# Patient Record
Sex: Female | Born: 1985 | Race: White | Hispanic: Yes | Marital: Single | Smoking: Never smoker
Health system: Southern US, Community
[De-identification: ages and names within clinical notes are randomized; demographics above are authoritative.]

## PROBLEM LIST (undated history)

## (undated) ENCOUNTER — Emergency Department (HOSPITAL_COMMUNITY): Admission: EM | Payer: Self-pay | Source: Home / Self Care

## (undated) DIAGNOSIS — Z789 Other specified health status: Secondary | ICD-10-CM

## (undated) DIAGNOSIS — I514 Myocarditis, unspecified: Secondary | ICD-10-CM

## (undated) DIAGNOSIS — M329 Systemic lupus erythematosus, unspecified: Secondary | ICD-10-CM

## (undated) HISTORY — DX: Myocarditis, unspecified: I51.4

## (undated) HISTORY — DX: Systemic lupus erythematosus, unspecified: M32.9

---

## 2005-07-09 ENCOUNTER — Ambulatory Visit: Payer: Self-pay | Admitting: *Deleted

## 2005-07-10 ENCOUNTER — Ambulatory Visit (HOSPITAL_COMMUNITY): Admission: RE | Admit: 2005-07-10 | Discharge: 2005-07-10 | Payer: Self-pay | Admitting: Obstetrics & Gynecology

## 2005-07-30 ENCOUNTER — Ambulatory Visit: Payer: Self-pay | Admitting: *Deleted

## 2005-08-12 ENCOUNTER — Ambulatory Visit: Payer: Self-pay | Admitting: *Deleted

## 2005-08-12 ENCOUNTER — Inpatient Hospital Stay (HOSPITAL_COMMUNITY): Admission: AD | Admit: 2005-08-12 | Discharge: 2005-08-14 | Payer: Self-pay | Admitting: Obstetrics and Gynecology

## 2006-03-20 ENCOUNTER — Emergency Department (HOSPITAL_COMMUNITY): Admission: EM | Admit: 2006-03-20 | Discharge: 2006-03-20 | Payer: Self-pay | Admitting: Emergency Medicine

## 2006-10-02 ENCOUNTER — Encounter: Payer: Self-pay | Admitting: Maternal & Fetal Medicine

## 2006-11-30 ENCOUNTER — Inpatient Hospital Stay: Payer: Self-pay

## 2012-07-22 NOTE — L&D Delivery Note (Signed)
Delivery Note At 5:39 AM a viable female precipitously delivered prior to arrival of MD or standby CNM via Vaginal, Spontaneous Delivery (Presentation: Right Occiput Anterior).  APGAR: 9, 9; weight pending.   Placenta status: Intact, Spontaneous.  Cord: 3 vessels with the following complications: None.  Cord pH: NA.  Anesthesia: None  Episiotomy: None Lacerations: None Suture Repair: NA Est. Blood Loss (mL): 150  Mom to postpartum.  Baby to nursery-stable. Dr. Tamela Oddi arrived after delivery of placenta.  Dorna Mallet 12/28/2012, 6:20 AM

## 2012-09-15 LAB — OB RESULTS CONSOLE ABO/RH: RH Type: POSITIVE

## 2012-09-15 LAB — OB RESULTS CONSOLE HEPATITIS B SURFACE ANTIGEN: Hepatitis B Surface Ag: NEGATIVE

## 2012-09-15 LAB — OB RESULTS CONSOLE RUBELLA ANTIBODY, IGM: Rubella: IMMUNE

## 2012-09-15 LAB — OB RESULTS CONSOLE HIV ANTIBODY (ROUTINE TESTING): HIV: NONREACTIVE

## 2012-09-15 LAB — OB RESULTS CONSOLE ANTIBODY SCREEN: Antibody Screen: NEGATIVE

## 2012-12-08 ENCOUNTER — Other Ambulatory Visit (HOSPITAL_COMMUNITY): Payer: Self-pay | Admitting: Obstetrics

## 2012-12-10 ENCOUNTER — Other Ambulatory Visit (HOSPITAL_COMMUNITY): Payer: Self-pay | Admitting: Obstetrics

## 2012-12-10 ENCOUNTER — Ambulatory Visit (HOSPITAL_COMMUNITY)
Admission: RE | Admit: 2012-12-10 | Discharge: 2012-12-10 | Disposition: A | Discharge status: Home / Self Care | Payer: Self-pay | Source: Ambulatory Visit | Attending: Obstetrics | Admitting: Obstetrics

## 2012-12-10 ENCOUNTER — Encounter (HOSPITAL_COMMUNITY): Payer: Self-pay

## 2012-12-10 DIAGNOSIS — O365990 Maternal care for other known or suspected poor fetal growth, unspecified trimester, not applicable or unspecified: Secondary | ICD-10-CM | POA: Insufficient documentation

## 2012-12-10 DIAGNOSIS — O365930 Maternal care for other known or suspected poor fetal growth, third trimester, not applicable or unspecified: Secondary | ICD-10-CM

## 2012-12-10 DIAGNOSIS — Z3689 Encounter for other specified antenatal screening: Secondary | ICD-10-CM | POA: Insufficient documentation

## 2012-12-15 ENCOUNTER — Other Ambulatory Visit (HOSPITAL_COMMUNITY): Payer: Self-pay

## 2012-12-17 ENCOUNTER — Other Ambulatory Visit (HOSPITAL_COMMUNITY): Payer: Self-pay | Admitting: Maternal and Fetal Medicine

## 2012-12-17 ENCOUNTER — Encounter (HOSPITAL_COMMUNITY): Payer: Self-pay

## 2012-12-17 ENCOUNTER — Ambulatory Visit (HOSPITAL_COMMUNITY)
Admission: RE | Admit: 2012-12-17 | Discharge: 2012-12-17 | Disposition: A | Discharge status: Home / Self Care | Payer: Self-pay | Source: Ambulatory Visit | Attending: Obstetrics | Admitting: Obstetrics

## 2012-12-17 VITALS — BP 98/67 | HR 65 | Wt 137.5 lb

## 2012-12-17 DIAGNOSIS — Z3689 Encounter for other specified antenatal screening: Secondary | ICD-10-CM | POA: Insufficient documentation

## 2012-12-17 DIAGNOSIS — O365930 Maternal care for other known or suspected poor fetal growth, third trimester, not applicable or unspecified: Secondary | ICD-10-CM

## 2012-12-17 DIAGNOSIS — O365990 Maternal care for other known or suspected poor fetal growth, unspecified trimester, not applicable or unspecified: Secondary | ICD-10-CM | POA: Insufficient documentation

## 2012-12-17 NOTE — Progress Notes (Signed)
Wendy Kaufman  was seen today for an ultrasound appointment.  See full report in [facility].  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  Alpha Gula, MD

## 2012-12-18 ENCOUNTER — Other Ambulatory Visit (HOSPITAL_COMMUNITY): Payer: Self-pay

## 2012-12-25 ENCOUNTER — Ambulatory Visit (HOSPITAL_COMMUNITY)
Admission: RE | Admit: 2012-12-25 | Discharge: 2012-12-25 | Disposition: A | Discharge status: Home / Self Care | Payer: Self-pay | Source: Ambulatory Visit | Attending: Obstetrics | Admitting: Obstetrics

## 2012-12-25 ENCOUNTER — Other Ambulatory Visit (HOSPITAL_COMMUNITY): Payer: Self-pay

## 2012-12-25 VITALS — BP 114/62 | HR 62 | Wt 141.0 lb

## 2012-12-25 DIAGNOSIS — O365930 Maternal care for other known or suspected poor fetal growth, third trimester, not applicable or unspecified: Secondary | ICD-10-CM

## 2012-12-25 DIAGNOSIS — O365990 Maternal care for other known or suspected poor fetal growth, unspecified trimester, not applicable or unspecified: Secondary | ICD-10-CM | POA: Insufficient documentation

## 2012-12-25 DIAGNOSIS — Z3689 Encounter for other specified antenatal screening: Secondary | ICD-10-CM | POA: Insufficient documentation

## 2012-12-25 NOTE — Progress Notes (Signed)
Wendy Kaufman was seen for ultrasound appointment today.  Please see [facility] report for details.

## 2012-12-28 ENCOUNTER — Encounter (HOSPITAL_COMMUNITY): Payer: Self-pay | Admitting: *Deleted

## 2012-12-28 ENCOUNTER — Inpatient Hospital Stay (HOSPITAL_COMMUNITY)
Admission: AD | Admit: 2012-12-28 | Discharge: 2012-12-30 | DRG: 775 | Disposition: A | Discharge status: Home / Self Care | Payer: Medicaid Other | Source: Ambulatory Visit | Attending: Obstetrics | Admitting: Obstetrics

## 2012-12-28 DIAGNOSIS — IMO0001 Reserved for inherently not codable concepts without codable children: Secondary | ICD-10-CM

## 2012-12-28 HISTORY — DX: Other specified health status: Z78.9

## 2012-12-28 LAB — CBC
MCHC: 34.9 g/dL (ref 30.0–36.0)
Platelets: 391 10*3/uL (ref 150–400)
RDW: 14.7 % (ref 11.5–15.5)
WBC: 9.1 10*3/uL (ref 4.0–10.5)

## 2012-12-28 MED ORDER — FERROUS SULFATE 325 (65 FE) MG PO TABS
325.0000 mg | ORAL_TABLET | Freq: Two times a day (BID) | ORAL | Status: DC
  Administered 2012-12-28 – 2012-12-30 (×4): 325 mg via ORAL
  Filled 2012-12-28 (×4): qty 1

## 2012-12-28 MED ORDER — WITCH HAZEL-GLYCERIN EX PADS: 1.0000 "application " | MEDICATED_PAD | CUTANEOUS | Status: DC | PRN

## 2012-12-28 MED ORDER — ZOLPIDEM TARTRATE 5 MG PO TABS: 5.0000 mg | ORAL_TABLET | Freq: Every evening | ORAL | Status: DC | PRN

## 2012-12-28 MED ORDER — LACTATED RINGERS IV SOLN
INTRAVENOUS | Status: DC
  Administered 2012-12-28: 03:00:00 via INTRAVENOUS

## 2012-12-28 MED ORDER — SENNOSIDES-DOCUSATE SODIUM 8.6-50 MG PO TABS
2.0000 | ORAL_TABLET | Freq: Every day | ORAL | Status: DC
  Administered 2012-12-28 – 2012-12-29 (×2): 2 via ORAL

## 2012-12-28 MED ORDER — LIDOCAINE HCL (PF) 1 % IJ SOLN
30.0000 mL | INTRAMUSCULAR | Status: DC | PRN
  Filled 2012-12-28 (×2): qty 30

## 2012-12-28 MED ORDER — PRENATAL MULTIVITAMIN CH
1.0000 | ORAL_TABLET | Freq: Every day | ORAL | Status: DC
  Administered 2012-12-28 – 2012-12-29 (×2): 1 via ORAL
  Filled 2012-12-28 (×2): qty 1

## 2012-12-28 MED ORDER — ONDANSETRON HCL 4 MG PO TABS: 4.0000 mg | ORAL_TABLET | ORAL | Status: DC | PRN

## 2012-12-28 MED ORDER — MAGNESIUM HYDROXIDE 400 MG/5ML PO SUSP: 30.0000 mL | ORAL | Status: DC | PRN

## 2012-12-28 MED ORDER — CITRIC ACID-SODIUM CITRATE 334-500 MG/5ML PO SOLN
30.0000 mL | ORAL | Status: DC | PRN
Start: 2012-12-28 — End: 2012-12-28

## 2012-12-28 MED ORDER — FLEET ENEMA 7-19 GM/118ML RE ENEM: 1.0000 | ENEMA | RECTAL | Status: DC | PRN

## 2012-12-28 MED ORDER — LACTATED RINGERS IV SOLN: 500.0000 mL | INTRAVENOUS | Status: DC | PRN

## 2012-12-28 MED ORDER — TETANUS-DIPHTH-ACELL PERTUSSIS 5-2.5-18.5 LF-MCG/0.5 IM SUSP
0.5000 mL | Freq: Once | INTRAMUSCULAR | Status: AC
  Administered 2012-12-29: 0.5 mL via INTRAMUSCULAR
  Filled 2012-12-28: qty 0.5

## 2012-12-28 MED ORDER — IBUPROFEN 600 MG PO TABS
600.0000 mg | ORAL_TABLET | Freq: Four times a day (QID) | ORAL | Status: DC
  Administered 2012-12-28 – 2012-12-30 (×8): 600 mg via ORAL
  Filled 2012-12-28 (×8): qty 1

## 2012-12-28 MED ORDER — BENZOCAINE-MENTHOL 20-0.5 % EX AERO: 1.0000 "application " | INHALATION_SPRAY | CUTANEOUS | Status: DC | PRN

## 2012-12-28 MED ORDER — OXYCODONE-ACETAMINOPHEN 5-325 MG PO TABS: 1.0000 | ORAL_TABLET | ORAL | Status: DC | PRN

## 2012-12-28 MED ORDER — ACETAMINOPHEN 325 MG PO TABS: 650.0000 mg | ORAL_TABLET | ORAL | Status: DC | PRN

## 2012-12-28 MED ORDER — DIPHENHYDRAMINE HCL 25 MG PO CAPS: 25.0000 mg | ORAL_CAPSULE | Freq: Four times a day (QID) | ORAL | Status: DC | PRN

## 2012-12-28 MED ORDER — ONDANSETRON HCL 4 MG/2ML IJ SOLN: 4.0000 mg | INTRAMUSCULAR | Status: DC | PRN

## 2012-12-28 MED ORDER — IBUPROFEN 600 MG PO TABS
600.0000 mg | ORAL_TABLET | Freq: Four times a day (QID) | ORAL | Status: DC | PRN
  Administered 2012-12-28: 600 mg via ORAL
  Filled 2012-12-28: qty 1

## 2012-12-28 MED ORDER — ONDANSETRON HCL 4 MG/2ML IJ SOLN: 4.0000 mg | Freq: Four times a day (QID) | INTRAMUSCULAR | Status: DC | PRN

## 2012-12-28 MED ORDER — LANOLIN HYDROUS EX OINT: TOPICAL_OINTMENT | CUTANEOUS | Status: DC | PRN

## 2012-12-28 MED ORDER — OXYTOCIN BOLUS FROM INFUSION
500.0000 mL | INTRAVENOUS | Status: DC
  Administered 2012-12-28: 500 mL via INTRAVENOUS

## 2012-12-28 MED ORDER — DIBUCAINE 1 % RE OINT: 1.0000 "application " | TOPICAL_OINTMENT | RECTAL | Status: DC | PRN

## 2012-12-28 MED ORDER — MEASLES, MUMPS & RUBELLA VAC ~~LOC~~ INJ: 0.5000 mL | INJECTION | Freq: Once | SUBCUTANEOUS | Status: DC

## 2012-12-28 MED ORDER — OXYTOCIN 40 UNITS IN LACTATED RINGERS INFUSION - SIMPLE MED
62.5000 mL/h | INTRAVENOUS | Status: DC
  Filled 2012-12-28: qty 1000

## 2012-12-28 NOTE — MAU Note (Signed)
Contractions and ?leaking fluid since 10 pm

## 2012-12-28 NOTE — H&P (Signed)
Wendy Kaufman is a 27 y.o. female presenting for contractions. Maternal Medical History:  Reason for admission: Contractions.   Contractions: Onset was 6-12 hours ago.    Fetal activity: Perceived fetal activity is normal.    Prenatal complications: U/S for growth w/lagging AC  Prenatal Complications - Diabetes: none.    OB History   Grav Para Term Preterm Abortions TAB SAB Ect Mult Living   3 2 2  0 0 0 0 0 0 2     Past Medical History  Diagnosis Date  . Medical history non-contributory    History reviewed. No pertinent past surgical history. Family History: family history is not on file. Social History:  reports that she has never smoked. She does not have any smokeless tobacco history on file. She reports that she does not drink alcohol or use illicit drugs.     Review of Systems  Constitutional: Negative for fever.  Eyes: Negative for blurred vision.  Respiratory: Negative for shortness of breath.   Gastrointestinal: Negative for vomiting.  Skin: Negative for rash.  Neurological: Negative for headaches.    Dilation: 8.5 Effacement (%): 100 Station: 0 Exam by:: [email or web address]-[email or web address], rnc Blood pressure 123/67, pulse 75, temperature 98.3 F (36.8 C), temperature source Oral, resp. rate 18, height 5' (1.524 m), weight 140 lb (63.504 kg), last menstrual period 04/20/2012, SpO2 100.00%. Maternal Exam:  Abdomen: not evaluated.  Introitus: not evaluated.     Fetal Exam Fetal Monitor Review: Variability: moderate (6-25 bpm).   Pattern: accelerations present and no decelerations.    Fetal State Assessment: Category I - tracings are normal.     Physical Exam  Constitutional: She appears well-developed.  HENT:  Head: Normocephalic.  Neck: Neck supple. No thyromegaly present.  Cardiovascular: Normal rate and regular rhythm.   Respiratory: Breath sounds normal.  GI: Soft. Bowel sounds are normal.  Skin: No rash noted.    Prenatal labs: ABO, Rh:  O/Positive/-- (02/25 0000) Antibody: Negative (02/25 0000) Rubella: Immune (02/25 0000) RPR: Nonreactive (02/25 0000)  HBsAg: Negative (02/25 0000)  HIV: Non-reactive (02/25 0000)  GBS:     Assessment/Plan: Multipara at term, active labor, Category 1 FHT Admit, anticipate an NSVD   JACKSON-MOORE,Shade Rivenbark A 12/28/2012, 5:57 AM

## 2012-12-28 NOTE — Progress Notes (Signed)
UR chart review completed.  

## 2012-12-28 NOTE — Lactation Note (Signed)
This note was copied from the chart of Wendy Jumanah Hynson. Lactation Consultation Note  Patient Name: Wendy Kaufman [id number] Date: 12/28/2012 Reason for consult: Initial assessment   Maternal Data Formula Feeding for Exclusion: Yes Reason for exclusion: Mother's choice to formula and breast feed on admission  Feeding   LATCH Score/Interventions     Lactation Tools Discussed/Used     Consult Status Consult Status: Follow-up Date: 12/28/12 Follow-up type: In-patient  Spanish brochure left with mom. Will follow up later today with Spanish interpreter.   Pamelia Hoit 12/28/2012, 11:47 AM

## 2012-12-29 NOTE — Progress Notes (Signed)
Patient ID: Wendy Kaufman, female   DOB: 1986-05-17, 27 y.o.   MRN: [id number] Postpartum day one Vital signs normal Fundus firm Lochia moderate Legs negative Doing well

## 2012-12-30 NOTE — Progress Notes (Signed)
Stopped by to check on patient. 

## 2012-12-30 NOTE — Lactation Note (Signed)
This note was copied from the chart of [name]. Lactation Consultation Note  Patient Name: [name] Date: 12/30/2012 Reason for consult: Follow-up assessment   Maternal Data Formula Feeding for Exclusion: Yes Reason for exclusion: Mother's choice to formula and breast feed on admission  Feeding   LATCH Score/Interventions  Lactation Tools Discussed/Used     Consult Status Consult Status: Complete  Went in with interpreter. Mom reports baby is nursing well and she has no questions.  Pamelia Hoit 12/30/2012, 10:55 AM

## 2012-12-30 NOTE — Lactation Note (Signed)
This note was copied from the chart of Girl Genita Nilsson. Lactation Consultation Note  Patient Name: Girl Pamella Samons [id number] Date: 12/30/2012 Reason for consult: Follow-up assessment   Maternal Data Formula Feeding for Exclusion: Yes Reason for exclusion: Mother's choice to formula and breast feed on admission  Feeding   LATCH Score/Interventions Latch: Grasps breast easily, tongue down, lips flanged, rhythmical sucking.  Audible Swallowing: A few with stimulation  Type of Nipple: Everted at rest and after stimulation  Comfort (Breast/Nipple): Soft / non-tender     Hold (Positioning): No assistance needed to correctly position infant at breast.  LATCH Score: 9  Lactation Tools Discussed/Used     Consult Status Consult Status: Complete  Experienced BF mom had baby latched to breast when I went in. Observed good latch with few swallows noted. To call  Prn.   Pamelia Hoit 12/30/2012, 8:49 AM

## 2012-12-30 NOTE — Discharge Summary (Signed)
Obstetric Discharge Summary Reason for Admission: induction of labor Prenatal Procedures: none Intrapartum Procedures: spontaneous vaginal delivery Postpartum Procedures: none Complications-Operative and Postpartum: none Hemoglobin  Date Value Range Status  12/28/2012 12.9  12.0 - 15.0 g/dL Final     HCT  Date Value Range Status  12/28/2012 37.0  36.0 - 46.0 % Final    Physical Exam:  General: alert Lochia: appropriate Uterine Fundus: firm Incision: healing well DVT Evaluation: No evidence of DVT seen on physical exam.  Discharge Diagnoses: Term Pregnancy-delivered  Discharge Information: Date: 12/30/2012 Activity: pelvic rest Diet: routine Medications: Percocet Condition: stable Instructions: refer to practice specific booklet Discharge to: home Follow-up Information   Schedule an appointment as soon as possible for a visit with Kathreen Cosier, MD.   Contact information:   [address] [place] [address] [phone]       Newborn Data: Live born female  Birth Weight: 4 lb 10.1 oz (2101 g) APGAR: 9, 9  Home with mother.  Lorana Maffeo A 12/30/2012, 6:42 AM

## 2013-01-01 ENCOUNTER — Ambulatory Visit (HOSPITAL_COMMUNITY): Payer: Self-pay

## 2014-02-12 ENCOUNTER — Encounter (HOSPITAL_COMMUNITY): Payer: Self-pay | Admitting: Emergency Medicine

## 2014-02-12 DIAGNOSIS — M7989 Other specified soft tissue disorders: Secondary | ICD-10-CM | POA: Insufficient documentation

## 2014-02-12 DIAGNOSIS — Z3202 Encounter for pregnancy test, result negative: Secondary | ICD-10-CM | POA: Insufficient documentation

## 2014-02-12 DIAGNOSIS — Z79899 Other long term (current) drug therapy: Secondary | ICD-10-CM | POA: Insufficient documentation

## 2014-02-12 DIAGNOSIS — M549 Dorsalgia, unspecified: Secondary | ICD-10-CM | POA: Insufficient documentation

## 2014-02-12 DIAGNOSIS — R109 Unspecified abdominal pain: Secondary | ICD-10-CM | POA: Insufficient documentation

## 2014-02-12 DIAGNOSIS — Z791 Long term (current) use of non-steroidal anti-inflammatories (NSAID): Secondary | ICD-10-CM | POA: Insufficient documentation

## 2014-02-12 DIAGNOSIS — R609 Edema, unspecified: Secondary | ICD-10-CM | POA: Insufficient documentation

## 2014-02-12 LAB — COMPREHENSIVE METABOLIC PANEL
ALBUMIN: 3.3 g/dL — AB (ref 3.5–5.2)
ALT: 23 U/L (ref 0–35)
AST: 28 U/L (ref 0–37)
Alkaline Phosphatase: 130 U/L — ABNORMAL HIGH (ref 39–117)
Anion gap: 11 (ref 5–15)
BUN: 12 mg/dL (ref 6–23)
CALCIUM: 8.4 mg/dL (ref 8.4–10.5)
CO2: 19 meq/L (ref 19–32)
Chloride: 108 mEq/L (ref 96–112)
Creatinine, Ser: 0.49 mg/dL — ABNORMAL LOW (ref 0.50–1.10)
GFR calc Af Amer: >90 mL/min (ref >90)
Glucose, Bld: 87 mg/dL (ref 70–99)
Potassium: 4.1 mEq/L (ref 3.7–5.3)
SODIUM: 138 meq/L (ref 137–147)
Total Bilirubin: <0.2 mg/dL — ABNORMAL LOW (ref 0.3–1.2)
Total Protein: 7.8 g/dL (ref 6.0–8.3)

## 2014-02-12 LAB — CBC WITH DIFFERENTIAL/PLATELET
BASOS ABS: 0 10*3/uL (ref 0.0–0.1)
BASOS PCT: 0 % (ref 0–1)
EOS PCT: 2 % (ref 0–5)
Eosinophils Absolute: 0.1 10*3/uL (ref 0.0–0.7)
HCT: 32 % — ABNORMAL LOW (ref 36.0–46.0)
Hemoglobin: 10.8 g/dL — ABNORMAL LOW (ref 12.0–15.0)
LYMPHS PCT: 21 % (ref 12–46)
Lymphs Abs: 1.2 10*3/uL (ref 0.7–4.0)
MCH: 27.6 pg (ref 26.0–34.0)
MCHC: 33.8 g/dL (ref 30.0–36.0)
MCV: 81.6 fL (ref 78.0–100.0)
Monocytes Absolute: 0.4 10*3/uL (ref 0.1–1.0)
Monocytes Relative: 7 % (ref 3–12)
NEUTROS ABS: 3.8 10*3/uL (ref 1.7–7.7)
Neutrophils Relative %: 70 % (ref 43–77)
PLATELETS: 442 10*3/uL — AB (ref 150–400)
RBC: 3.92 MIL/uL (ref 3.87–5.11)
RDW: 16.7 % — AB (ref 11.5–15.5)
WBC: 5.5 10*3/uL (ref 4.0–10.5)

## 2014-02-12 NOTE — ED Notes (Signed)
Pt in c/o lower back pain, and bilateral hand and foot swelling for three days, no history of same, no distress noted

## 2014-02-13 ENCOUNTER — Emergency Department (HOSPITAL_COMMUNITY)
Admission: EM | Admit: 2014-02-13 | Discharge: 2014-02-13 | Disposition: A | Discharge status: Home / Self Care | Payer: Self-pay | Attending: Emergency Medicine | Admitting: Emergency Medicine

## 2014-02-13 ENCOUNTER — Emergency Department (HOSPITAL_COMMUNITY): Payer: Medicaid Other

## 2014-02-13 DIAGNOSIS — R609 Edema, unspecified: Secondary | ICD-10-CM

## 2014-02-13 LAB — URINALYSIS, ROUTINE W REFLEX MICROSCOPIC
Bilirubin Urine: NEGATIVE
Glucose, UA: NEGATIVE mg/dL
Ketones, ur: NEGATIVE mg/dL
Leukocytes, UA: NEGATIVE
NITRITE: NEGATIVE
Protein, ur: NEGATIVE mg/dL
SPECIFIC GRAVITY, URINE: 1.011 (ref 1.005–1.030)
UROBILINOGEN UA: 0.2 mg/dL (ref 0.0–1.0)
pH: 5.5 (ref 5.0–8.0)

## 2014-02-13 LAB — PREGNANCY, URINE: PREG TEST UR: NEGATIVE

## 2014-02-13 LAB — URINE MICROSCOPIC-ADD ON

## 2014-02-13 MED ORDER — FUROSEMIDE 20 MG PO TABS
40.0000 mg | ORAL_TABLET | Freq: Once | ORAL | Status: AC
  Administered 2014-02-13: 40 mg via ORAL
  Filled 2014-02-13: qty 2

## 2014-02-13 MED ORDER — FUROSEMIDE 20 MG PO TABS: 20.0000 mg | ORAL_TABLET | Freq: Every day | ORAL | Status: DC | PRN

## 2014-02-13 NOTE — ED Provider Notes (Signed)
CSN: [id number]     Arrival date & time 02/12/14  2200 History   First MD Initiated Contact with Patient 02/13/14 0252     Chief Complaint  Patient presents with  . Back Pain  . Foot Swelling     (Consider location/radiation/quality/duration/timing/severity/associated sxs/prior Treatment) HPI Patient complains of 3 days of upper and lower extremity bilateral swelling. Patient never had any similar symptoms. She does stand a lot. She denies any shortness of breath or chest pain. She has had low back pain. She denies hematuria, nausea or vomiting. She's had no fever chills. She's had no recent extended travel. Past Medical History  Diagnosis Date  . Medical history non-contributory    History reviewed. No pertinent past surgical history. History reviewed. No pertinent family history. History  Substance Use Topics  . Smoking status: Never Smoker   . Smokeless tobacco: Not on file  . Alcohol Use: No   OB History   Grav Para Term Preterm Abortions TAB SAB Ect Mult Living   3 3 3  0 0 0 0 0 0 3     Review of Systems  Constitutional: Negative for fever and chills.  Respiratory: Negative for cough and shortness of breath.   Cardiovascular: Positive for leg swelling. Negative for chest pain and palpitations.  Gastrointestinal: Negative for nausea, vomiting, abdominal pain and diarrhea.  Genitourinary: Negative for dysuria, frequency, hematuria and flank pain.  Musculoskeletal: Positive for back pain and myalgias. Negative for neck pain and neck stiffness.  Skin: Negative for rash and wound.  Neurological: Negative for dizziness, weakness, light-headedness, numbness and headaches.  All other systems reviewed and are negative.     Allergies  Review of patient's allergies indicates no known allergies.  Home Medications   Prior to Admission medications   Medication Sig Start Date End Date Taking? Authorizing Provider  naproxen (NAPROSYN) 500 MG tablet Take 500 mg by mouth 2 (two)  times daily with a meal.   Yes Historical Provider, MD  furosemide (LASIX) 20 MG tablet Take 1 tablet (20 mg total) by mouth daily as needed for edema. 02/13/14   Loren Racer, MD   BP 103/74  Pulse 59  Temp(Src) 98 F (36.7 C) (Oral)  Resp 17  SpO2 99% Physical Exam  Nursing note and vitals reviewed. Constitutional: She is oriented to person, place, and time. She appears well-developed and well-nourished. No distress.  HENT:  Head: Normocephalic and atraumatic.  Mouth/Throat: Oropharynx is clear and moist.  Eyes: EOM are normal. Pupils are equal, round, and reactive to light.  Neck: Normal range of motion. Neck supple.  Cardiovascular: Normal rate and regular rhythm.  Exam reveals no gallop and no friction rub.   No murmur heard. Pulmonary/Chest: Effort normal and breath sounds normal. No respiratory distress. She has no wheezes. She has no rales. She exhibits no tenderness.  Abdominal: Soft. Bowel sounds are normal. She exhibits no distension and no mass. There is no tenderness. There is no rebound and no guarding.  Musculoskeletal: Normal range of motion. She exhibits edema. She exhibits no tenderness.  2+ pitting edema to the midcalf down to the feet bilaterally. She has no calf tenderness. Patient also has pitting edema to the dorsum of bilateral hands. She is tender to palpation diffusely in the lumbar and flank region. No specific midline thoracic or lumbar tenderness.  Neurological: She is alert and oriented to person, place, and time.  5/5 motor in all extremities. Sensation is grossly intact.  Skin: Skin is warm and  dry. No rash noted. No erythema.  Psychiatric: She has a normal mood and affect. Her behavior is normal.    ED Course  Procedures (including critical care time) Labs Review Labs Reviewed  URINALYSIS, ROUTINE W REFLEX MICROSCOPIC - Abnormal; Notable for the following:    Hgb urine dipstick LARGE (*)    All other components within normal limits  CBC WITH  DIFFERENTIAL - Abnormal; Notable for the following:    Hemoglobin 10.8 (*)    HCT 32.0 (*)    RDW 16.7 (*)    Platelets 442 (*)    All other components within normal limits  COMPREHENSIVE METABOLIC PANEL - Abnormal; Notable for the following:    Creatinine, Ser 0.49 (*)    Albumin 3.3 (*)    Alkaline Phosphatase 130 (*)    Total Bilirubin <0.2 (*)    All other components within normal limits  URINE MICROSCOPIC-ADD ON - Abnormal; Notable for the following:    Squamous Epithelial / LPF FEW (*)    All other components within normal limits  PREGNANCY, URINE    Imaging Review Ct Abdomen Pelvis Wo Contrast  02/13/2014   CLINICAL DATA:  Right flank pain and hematuria. Lower back pain. Bilateral hand and foot swelling for 3 days.  EXAM: CT ABDOMEN AND PELVIS WITHOUT CONTRAST  TECHNIQUE: Multidetector CT imaging of the abdomen and pelvis was performed following the standard protocol without IV contrast.  COMPARISON:  Pelvic ultrasound performed 12/25/2012  FINDINGS: Trace bilateral pleural effusions are seen, with mild interstitial prominence. This could reflect minimal interstitial edema.  The liver and spleen are unremarkable in appearance. The gallbladder is within normal limits. The pancreas and adrenal glands are unremarkable.  The kidneys are unremarkable in appearance. There is no evidence of hydronephrosis. No renal or ureteral stones are seen. No perinephric stranding is appreciated.  No free fluid is identified. The small bowel is unremarkable in appearance. The stomach is within normal limits. No acute vascular abnormalities are seen.  Mild soft tissue edema is noted along the lateral abdominal wall bilaterally.  The appendix is normal in caliber and contains air, without evidence for appendicitis. The colon is unremarkable in appearance.  The bladder is mildly distended and grossly unremarkable. The uterus is within normal limits. The ovaries are relatively symmetric. No suspicious adnexal  masses are seen. A small right ovarian follicle is noted.  Scattered prominent bilateral inguinal nodes are seen, measuring up to 1.5 cm in short axis. These are of uncertain significance.  No acute osseous abnormalities are identified.  IMPRESSION: 1. No acute abnormalities seen to explain the patient's symptoms. 2. Trace bilateral pleural effusions, with mild interstitial prominence. This could reflect minimal pulmonary edema. 3. Mild soft tissue edema along the lateral abdominal wall bilaterally. 4. Scattered prominent bilateral inguinal nodes, measuring up to 1.5 cm in short axis. These are of uncertain significance, though the largest node, on the right side, has a relatively benign appearance.   Electronically Signed   By: Roanna RaiderJeffery  Chang M.D.   On: 02/13/2014 04:14     EKG Interpretation None      MDM   Final diagnoses:  Peripheral edema      Labs within normal limits. CT without acute findings other than small fluid in soft tissues in the lungs. Patient with fluid retention for unknown reason. Given dose of Lasix in the emergency department. Start him when necessary low dose Lasix. Advised to use compression stockings when standing. She's also advised to establish care  with primary Dr. for followup. She's been given return precautions.  Loren Racer, MD 02/13/14 [phone]

## 2014-02-13 NOTE — Discharge Instructions (Signed)
Establish care with a primary MD in followup. Take medication as prescribed for swelling. Return immediately to the emergency department for worsening swelling, difficulty breathing, chest pain or for any concerns.  Edema perifrico (Peripheral Edema) Usted sufre hinchazn en las piernas, lo que se denomina edema perifrico. Esta hinchazn se debe al exceso de acumulacin de sal y agua en el organismo. El edema puede ser un signo de enfermedad cardaca, renal o heptica, o el efecto secundario de un medicamento. Tambin puede deberse a otros problemas en las venas de las piernas. Si el problema se debe a una circulacin venosa deficiente, eleve las piernas y [place] medias especiales de soporte. Evite permanecer de pie durante largos perodos. El tratamiento depende de la causa. Los chips, pickles y otros alimentos salados debern evitarse. Casi siempre es necesario restringir la sal en la dieta. Le indicarn diurticos para eliminar el exceso de sal y agua del organismo por la [place]. Estos medicamentos evitan que el rin absorba el sodio. Aumentan el flujo de [place]. El tratamiento con diurticos tambin [employer] niveles de potasio del [place]. Ser necesario que utilice suplementos de potasio si toma diurticos [employer]. Controle el peso diario para verificar los progresos en la mejora del edema. Comunquese con su mdico para realizar un control segn las indicaciones. SOLICITE ATENCIN MDICA DE INMEDIATO SI:  Aumenta en gran medida la hinchazn, el dolor, la inflamacin o el calor en las piernas.  Le falta el aire, especialmente estando [place].  Siente dolor en el pecho o en el abdomen, debilidad, o se marea.  Tiene fiebre. Document Released: 07/08/2005 Document Revised: 09/30/2011 [facility] Patient Information 2015 [place], [place]. This information is not intended to replace advice given to you by your health care provider. Make sure you discuss any questions you have with your  health care provider.  Emergency Department Resource Guide 1) Find a Doctor and Pay Out of Pocket Although you won't have to find out who is covered by your insurance plan, it is a good idea to ask around and get recommendations. You will then need to call the office and see if the doctor you have chosen will accept you as a new patient and what types of options they offer for patients who are self-pay. Some doctors offer discounts or will set up payment plans for their patients who do not have insurance, but you will need to ask so you aren't surprised when you get to your appointment.  2) Contact Your Local Health Department Not all health departments have doctors that can see patients for sick visits, but many do, so it is worth a call to see if yours does. If you don't know where your local health department is, you can check in your phone book. The CDC also has a tool to help you locate your state's health department, and many state websites also have listings of all of their local health departments.  3) Find a Walk-in Clinic If your illness is not likely to be very severe or complicated, you may want to try a walk in clinic. These are popping up all over the country in pharmacies, drugstores, and shopping centers. They're usually staffed by nurse practitioners or physician assistants that have been trained to treat common illnesses and complaints. They're usually fairly quick and inexpensive. However, if you have serious medical issues or chronic medical problems, these are probably not your best option.  No Primary Care Doctor: - Call Health Connect at  [phone] - they can  help you locate a primary care doctor that  accepts your insurance, provides certain services, etc. - Physician Referral Service- [phone]  Chronic Pain Problems: Organization         Address  Phone   Notes  [facility]  [phone] Patients need to be referred by their primary care doctor.    Medication Assistance: Organization         Address  Phone   Notes  [facility] [address] [place] [place], [place] [address] [phone] --Must be a resident of [facility] -- Must have NO insurance coverage whatsoever (no Medicaid/ Medicare, etc.) -- The pt. MUST have a primary care doctor that directs their care regularly and follows them in the community   [facility]  [phone]   [employer]  [phone]    Agencies that provide inexpensive medical care: Organization         Address  Phone   Notes  [facility]  [phone]   [facility]    [phone]   [facility] [address] [place], [place] [address] [phone]   [facility] [address] [place], [place] [phone]   Planned Parenthood    [phone]   [facility]    [phone]   [facility]  [address], [place] Phone:  [phone], Fax:  [phone] Hours of Operation:  9 am - 6 pm, M-F.  Also accepts Medicaid/Medicare and self-pay.  [facility]  [address], [place] Phone: [phone], Fax: [phone]. Hours of Operation:  8:30 am - 5:30 pm, M-F.  Also accepts Medicaid and self-pay.  [facility] [place] [address], [place] Phone: [phone]   [facility] [address] Natasha Bence [place], [place] [phone], Ext. [phone] Mondays & Thursdays: 7-9 AM.  First 15 patients are seen on a first come, first serve basis.    Medicaid-accepting [facility] Providers:  Organization         Address  Phone   Notes  [facility] [address], [place] [phone] Also accepts self-pay patients.  [facility] [address] Laurell Josephs [place], [place]  [phone]   [facility] [address], [place] [phone]   [facility] [address] [place] [phone]   Renaye Rakers [address], [place]   [phone] Only accepts [place] Access [place] patients after they have their name applied to their card.   Self-Pay (no insurance) in [facility]:  Organization         Address  Phone   Notes  Sickle Cell Patients, [facility] [address] [place], [place] [phone]   [facility] [address] [place], [place] [phone]   [facility] [place]  [address], [place] [phone]   [facility]/Dr. Osei-Bonsu  [address], Benjamin Perez or [address], [place] [phone] Phone number for both [place] and [place] locations is the same.  [facility] [address], [place] [phone]   [facility] [address], [place] or [address] [phone]   [facility] [address], [place] [phone]) [phone], phone; [phone]) [phone], fax Sees patients 1st and 3rd Saturday of every month.  Must not qualify for public or private insurance (i.e. Medicaid, Medicare, [place] Health Choice, Veterans' Benefits)  Household income should be no more than 200% of the poverty level The clinic cannot treat you if you are pregnant or think you are pregnant  Sexually transmitted diseases are not treated at the clinic.    Dental Care: Organization         Address  Phone  Notes  [facility] [address], [place] [phone]) [phone] Accepts children up to age 28 who are enrolled in [place] or [place] Health Choice; pregnant women with a Medicaid card; and children who have applied for Medicaid or [place] Health Choice, but were declined, whose parents can pay a reduced fee at time of service.    [facility] [place]  [address], [place] [phone]) [phone] Accepts children up to age 28 who are enrolled in [place] or [place] Health Choice; pregnant women with a Medicaid card; and children who have applied for Medicaid or [place] Health Choice, but were declined, whose parents can pay a reduced fee at time of service.  [facility]  [address], [place] [phone]) [phone] Patients are seen by appointment only. Walk-ins are not accepted. [facility] will see patients 28 years of age and older. Monday - Tuesday (8am-5pm) Most Wednesdays (8:30-5pm) $30 per visit, cash only  [facility]  [address], [facility] [phone]) [phone] Patients are seen by appointment only. Walk-ins are not accepted. [facility] will see patients 28 years of age and older. One Wednesday Evening (Monthly: Volunteer Based).  $30 per visit, cash only  [employer] [facility] [facility]  [phone]) [phone] for adults; Children under age 604, call Graduate Pediatric Dentistry at [phone]. Children aged 604-14, please call [phone]) [phone] to request a pediatric application.  Dental services are provided in all areas of dental care including fillings, crowns and bridges, complete and partial dentures, implants, gum treatment, root canals, and extractions. Preventive care is also provided. Treatment is provided to both adults and children. Patients are selected via a lottery and there is often a waiting list.   [facility] [address], [place]  [phone]) [phone] [email or web address]   [facility] [address], [place], [place] [phone], Ext. [phone] Second and Fourth Thursday of each month, opens at 6:30 AM; Clinic ends at 9 AM.  Patients are seen on a first-come first-served basis, and a limited number are seen during each clinic.   [facility]  [address], [place], [place] [phone])  [phone]   Eligibility Requirements You must have lived in [place], [place], or [place] counties for at least the last three months.   You cannot be eligible for state or federal sponsored [place] Cityhealthcare insurance, including CIGNAVeterans Administration, [place]icaid, or [employer]icare.   You generally cannot be eligible for healthcare insurance through your employer.    How to apply: Eligibility screenings are held every Tuesday and Wednesday afternoon from 1:00 pm until 4:00 pm. You do not need an appointment for the interview!  [facility] [address], [place], [place] [phone]   [facility]  [phone]   [facility]  [phone]   [facility]  [phone]    Behavioral Health Resources in the Community: Intensive Outpatient Programs Organization         Address  Phone  Notes  [facility] [address] [place], [place] [phone]   [facility] [address], [place], [place] [phone]   [facility] [address] [place], [place]  [phone]   [facility] [address]  [place], [place] [phone] or [phone]   Substance Abuse Resources Organization         Address  Phone  Notes  [facility] Drug Services  4254818279   [facility]  [phone]   [facility]  [phone]   Floydene Flock  [phone]   Residential & Outpatient Substance Abuse Program  [phone]   Psychological Services Organization         Address  Phone  Notes  [facility]  [phone]   [facility]  [phone]   [facility] [address] [place] [phone] or [phone]    Mobile Crisis Teams Organization         Address  Phone  Notes  [facility], Mobile Crisis Care Unit  3050672799   [facility]  [address] [place], [place] [phone]   Doristine Locks [address] [place] [phone]    Self-Help/Support Groups Organization         Address  Phone             Notes  [facility] [place] - variety of support groups  336- I7437963 Call for more information  Narcotics Anonymous (NA), [facility] [address], [employer] [place]  2 meetings at this location   Statistician         Address  Phone  Notes  [facility] [address],    [place]  [phone]   [facility]  [address] [place] [address], [place], [place] [phone]   [facility] [address] [place], [place] [phone] Admissions: 8am-3pm M-F  [facility] [address],    [place], [place] [phone]   [facility] [address] [place], [place], [place] [phone]   [facility] [address],  [place], [place] [phone]   [facility] - Intensive Outpatient [address] Laurell Josephs [address], [place], [place] [phone]   [facility] ([facility] [address] [place]  [place], [place] [phone] or [phone]   [facility] ([facility]) [address] [place], [place] [phone] Accepts Medicaid  [facility] [address]  [place] [phone] Substance Abuse/Addiction Treatment   [facility] Organization         Address  Phone  Notes  [facility]  [phone]   Angie Fava, PhD [address], [place]   [phone] or [phone]   [facility] Behavioral   [address] [place], [place] [phone]   [facility] [address] [place], [place] [phone] Insurance/Medicaid/sponsorship through [employer] and Families [address]  [place], [place] [phone]  Therapy/tele-psych/case  °[facility] [address]  ° [place],  [phone]    °Dr. Arfeen  [phone]   °[facility] [place]  [employer] [place] [facility] 1) [address], [place] °2) [address], [place] °3)  [address], [place] [phone]) [phone] °([phone]) [phone] ° °([phone]) [phone]   °[place] Child Abuse Hotline (336) 342-1394 or (336) [phone] (After Hours)    ° ° ° °

## 2014-03-08 ENCOUNTER — Encounter (HOSPITAL_COMMUNITY): Payer: Self-pay | Admitting: Emergency Medicine

## 2014-03-08 DIAGNOSIS — J039 Acute tonsillitis, unspecified: Secondary | ICD-10-CM | POA: Diagnosis present

## 2014-03-08 DIAGNOSIS — J90 Pleural effusion, not elsewhere classified: Secondary | ICD-10-CM | POA: Diagnosis present

## 2014-03-08 DIAGNOSIS — D509 Iron deficiency anemia, unspecified: Secondary | ICD-10-CM | POA: Diagnosis present

## 2014-03-08 DIAGNOSIS — R599 Enlarged lymph nodes, unspecified: Secondary | ICD-10-CM | POA: Diagnosis present

## 2014-03-08 DIAGNOSIS — J9600 Acute respiratory failure, unspecified whether with hypoxia or hypercapnia: Secondary | ICD-10-CM | POA: Diagnosis not present

## 2014-03-08 DIAGNOSIS — M329 Systemic lupus erythematosus, unspecified: Principal | ICD-10-CM | POA: Diagnosis present

## 2014-03-08 DIAGNOSIS — E872 Acidosis, unspecified: Secondary | ICD-10-CM | POA: Diagnosis present

## 2014-03-08 DIAGNOSIS — R6510 Systemic inflammatory response syndrome (SIRS) of non-infectious origin without acute organ dysfunction: Secondary | ICD-10-CM | POA: Diagnosis present

## 2014-03-08 DIAGNOSIS — E871 Hypo-osmolality and hyponatremia: Secondary | ICD-10-CM | POA: Diagnosis present

## 2014-03-08 DIAGNOSIS — I498 Other specified cardiac arrhythmias: Secondary | ICD-10-CM | POA: Diagnosis present

## 2014-03-08 LAB — RAPID STREP SCREEN (MED CTR MEBANE ONLY): STREPTOCOCCUS, GROUP A SCREEN (DIRECT): NEGATIVE

## 2014-03-08 NOTE — ED Notes (Signed)
The patient is compalinng of neck pain and sore throat.  The patient said she is unable to eat due to the pain because she cannot swallow.  The patient was seen here for peripheral edema on the 26 of July and was discharged with medication.  She says she has been taking the medication but they are not working.  She continues to have swelling in both her feet and her hands.  The patient denies any other symptoms.

## 2014-03-09 ENCOUNTER — Inpatient Hospital Stay (HOSPITAL_COMMUNITY)
Admission: EM | Admit: 2014-03-09 | Discharge: 2014-03-15 | DRG: 515 | Disposition: A | Discharge status: Home / Self Care | Payer: Medicaid Other | Attending: Internal Medicine | Admitting: Internal Medicine

## 2014-03-09 ENCOUNTER — Emergency Department (HOSPITAL_COMMUNITY): Payer: Medicaid Other

## 2014-03-09 ENCOUNTER — Inpatient Hospital Stay (HOSPITAL_COMMUNITY): Payer: Medicaid Other

## 2014-03-09 ENCOUNTER — Encounter (HOSPITAL_COMMUNITY): Payer: Self-pay | Admitting: *Deleted

## 2014-03-09 DIAGNOSIS — J039 Acute tonsillitis, unspecified: Secondary | ICD-10-CM | POA: Diagnosis present

## 2014-03-09 DIAGNOSIS — I498 Other specified cardiac arrhythmias: Secondary | ICD-10-CM | POA: Diagnosis present

## 2014-03-09 DIAGNOSIS — E872 Acidosis, unspecified: Secondary | ICD-10-CM | POA: Diagnosis present

## 2014-03-09 DIAGNOSIS — R768 Other specified abnormal immunological findings in serum: Secondary | ICD-10-CM

## 2014-03-09 DIAGNOSIS — I7789 Other specified disorders of arteries and arterioles: Secondary | ICD-10-CM

## 2014-03-09 DIAGNOSIS — M7989 Other specified soft tissue disorders: Secondary | ICD-10-CM

## 2014-03-09 DIAGNOSIS — IMO0001 Reserved for inherently not codable concepts without codable children: Secondary | ICD-10-CM

## 2014-03-09 DIAGNOSIS — J90 Pleural effusion, not elsewhere classified: Secondary | ICD-10-CM

## 2014-03-09 DIAGNOSIS — M329 Systemic lupus erythematosus, unspecified: Secondary | ICD-10-CM

## 2014-03-09 DIAGNOSIS — D509 Iron deficiency anemia, unspecified: Secondary | ICD-10-CM

## 2014-03-09 DIAGNOSIS — R609 Edema, unspecified: Secondary | ICD-10-CM | POA: Diagnosis present

## 2014-03-09 DIAGNOSIS — I509 Heart failure, unspecified: Secondary | ICD-10-CM

## 2014-03-09 DIAGNOSIS — IMO0002 Reserved for concepts with insufficient information to code with codable children: Secondary | ICD-10-CM

## 2014-03-09 DIAGNOSIS — E871 Hypo-osmolality and hyponatremia: Secondary | ICD-10-CM

## 2014-03-09 DIAGNOSIS — M3219 Other organ or system involvement in systemic lupus erythematosus: Secondary | ICD-10-CM

## 2014-03-09 DIAGNOSIS — J9600 Acute respiratory failure, unspecified whether with hypoxia or hypercapnia: Secondary | ICD-10-CM | POA: Diagnosis not present

## 2014-03-09 DIAGNOSIS — R6510 Systemic inflammatory response syndrome (SIRS) of non-infectious origin without acute organ dysfunction: Secondary | ICD-10-CM | POA: Diagnosis present

## 2014-03-09 DIAGNOSIS — R591 Generalized enlarged lymph nodes: Secondary | ICD-10-CM | POA: Diagnosis present

## 2014-03-09 DIAGNOSIS — D649 Anemia, unspecified: Secondary | ICD-10-CM | POA: Diagnosis present

## 2014-03-09 DIAGNOSIS — R599 Enlarged lymph nodes, unspecified: Secondary | ICD-10-CM | POA: Diagnosis present

## 2014-03-09 HISTORY — DX: Reserved for concepts with insufficient information to code with codable children: IMO0002

## 2014-03-09 HISTORY — DX: Systemic lupus erythematosus, unspecified: M32.9

## 2014-03-09 LAB — BASIC METABOLIC PANEL
Anion gap: 12 (ref 5–15)
BUN: 17 mg/dL (ref 6–23)
CHLORIDE: 100 meq/L (ref 96–112)
CO2: 18 mEq/L — ABNORMAL LOW (ref 19–32)
Calcium: 8.2 mg/dL — ABNORMAL LOW (ref 8.4–10.5)
Creatinine, Ser: 0.65 mg/dL (ref 0.50–1.10)
GFR calc Af Amer: >90 mL/min (ref >90)
GFR calc non Af Amer: >90 mL/min (ref >90)
GLUCOSE: 87 mg/dL (ref 70–99)
POTASSIUM: 4.3 meq/L (ref 3.7–5.3)
Sodium: 130 mEq/L — ABNORMAL LOW (ref 137–147)

## 2014-03-09 LAB — CBC WITH DIFFERENTIAL/PLATELET
BASOS ABS: 0 10*3/uL (ref 0.0–0.1)
Basophils Relative: 1 % (ref 0–1)
EOS PCT: 1 % (ref 0–5)
Eosinophils Absolute: 0 10*3/uL (ref 0.0–0.7)
HCT: 30.3 % — ABNORMAL LOW (ref 36.0–46.0)
Hemoglobin: 10.3 g/dL — ABNORMAL LOW (ref 12.0–15.0)
LYMPHS PCT: 23 % (ref 12–46)
Lymphs Abs: 1 10*3/uL (ref 0.7–4.0)
MCH: 27 pg (ref 26.0–34.0)
MCHC: 34 g/dL (ref 30.0–36.0)
MCV: 79.3 fL (ref 78.0–100.0)
MONOS PCT: 9 % (ref 3–12)
Monocytes Absolute: 0.4 10*3/uL (ref 0.1–1.0)
NEUTROS PCT: 66 % (ref 43–77)
Neutro Abs: 3 10*3/uL (ref 1.7–7.7)
PLATELETS: 365 10*3/uL (ref 150–400)
RBC: 3.82 MIL/uL — AB (ref 3.87–5.11)
RDW: 17.1 % — AB (ref 11.5–15.5)
WBC MORPHOLOGY: INCREASED
WBC: 4.4 10*3/uL (ref 4.0–10.5)

## 2014-03-09 LAB — I-STAT TROPONIN, ED: Troponin i, poc: 0 ng/mL (ref 0.00–0.08)

## 2014-03-09 LAB — COMPREHENSIVE METABOLIC PANEL
ALT: 12 U/L (ref 0–35)
ANION GAP: 13 (ref 5–15)
AST: 19 U/L (ref 0–37)
Albumin: 2.6 g/dL — ABNORMAL LOW (ref 3.5–5.2)
Alkaline Phosphatase: 118 U/L — ABNORMAL HIGH (ref 39–117)
BILIRUBIN TOTAL: 0.3 mg/dL (ref 0.3–1.2)
BUN: 15 mg/dL (ref 6–23)
CO2: 18 meq/L — AB (ref 19–32)
CREATININE: 0.57 mg/dL (ref 0.50–1.10)
Calcium: 7.8 mg/dL — ABNORMAL LOW (ref 8.4–10.5)
Chloride: 105 mEq/L (ref 96–112)
Glucose, Bld: 92 mg/dL (ref 70–99)
Potassium: 4 mEq/L (ref 3.7–5.3)
Sodium: 136 mEq/L — ABNORMAL LOW (ref 137–147)
Total Protein: 7.4 g/dL (ref 6.0–8.3)

## 2014-03-09 LAB — LACTATE DEHYDROGENASE: LDH: 159 U/L (ref 94–250)

## 2014-03-09 LAB — URINALYSIS, ROUTINE W REFLEX MICROSCOPIC
Bilirubin Urine: NEGATIVE
Glucose, UA: NEGATIVE mg/dL
KETONES UR: NEGATIVE mg/dL
NITRITE: NEGATIVE
Protein, ur: 30 mg/dL — AB
SPECIFIC GRAVITY, URINE: 1.015 (ref 1.005–1.030)
Urobilinogen, UA: 0.2 mg/dL (ref 0.0–1.0)
pH: 5.5 (ref 5.0–8.0)

## 2014-03-09 LAB — TSH: TSH: 3.22 u[IU]/mL (ref 0.350–4.500)

## 2014-03-09 LAB — PRO B NATRIURETIC PEPTIDE: Pro B Natriuretic peptide (BNP): 431.5 pg/mL — ABNORMAL HIGH (ref 0–125)

## 2014-03-09 LAB — HEPATIC FUNCTION PANEL
ALT: 15 U/L (ref 0–35)
AST: 27 U/L (ref 0–37)
Albumin: 3 g/dL — ABNORMAL LOW (ref 3.5–5.2)
Alkaline Phosphatase: 138 U/L — ABNORMAL HIGH (ref 39–117)
Total Bilirubin: 0.4 mg/dL (ref 0.3–1.2)
Total Protein: 8.1 g/dL (ref 6.0–8.3)

## 2014-03-09 LAB — URINE MICROSCOPIC-ADD ON

## 2014-03-09 LAB — URIC ACID: Uric Acid, Serum: 6.7 mg/dL (ref 2.4–7.0)

## 2014-03-09 LAB — TROPONIN I

## 2014-03-09 LAB — CBC
HCT: 31.8 % — ABNORMAL LOW (ref 36.0–46.0)
Hemoglobin: 10.8 g/dL — ABNORMAL LOW (ref 12.0–15.0)
MCH: 27.1 pg (ref 26.0–34.0)
MCHC: 34 g/dL (ref 30.0–36.0)
MCV: 79.7 fL (ref 78.0–100.0)
Platelets: 355 10*3/uL (ref 150–400)
RBC: 3.99 MIL/uL (ref 3.87–5.11)
RDW: 17.1 % — ABNORMAL HIGH (ref 11.5–15.5)
WBC: 4.9 10*3/uL (ref 4.0–10.5)

## 2014-03-09 LAB — IRON AND TIBC
Iron: <10 ug/dL — ABNORMAL LOW (ref 42–135)
UIBC: 179 ug/dL (ref 125–400)

## 2014-03-09 LAB — PROTEIN / CREATININE RATIO, URINE
CREATININE, URINE: 24.99 mg/dL
Protein Creatinine Ratio: 0.52 — ABNORMAL HIGH (ref 0.00–0.15)
Total Protein, Urine: 12.9 mg/dL

## 2014-03-09 LAB — LIPASE, BLOOD: LIPASE: 33 U/L (ref 11–59)

## 2014-03-09 LAB — I-STAT CG4 LACTIC ACID, ED: Lactic Acid, Venous: 0.78 mmol/L (ref 0.5–2.2)

## 2014-03-09 MED ORDER — SODIUM CHLORIDE 0.9 % IV BOLUS (SEPSIS)
1000.0000 mL | Freq: Once | INTRAVENOUS | Status: AC
  Administered 2014-03-09: 1000 mL via INTRAVENOUS

## 2014-03-09 MED ORDER — CLINDAMYCIN PHOSPHATE 600 MG/50ML IV SOLN
600.0000 mg | Freq: Once | INTRAVENOUS | Status: AC
  Administered 2014-03-09: 600 mg via INTRAVENOUS
  Filled 2014-03-09: qty 50

## 2014-03-09 MED ORDER — SODIUM CHLORIDE 0.9 % IV SOLN: 250.0000 mL | INTRAVENOUS | Status: DC | PRN

## 2014-03-09 MED ORDER — AZITHROMYCIN 500 MG PO TABS
500.0000 mg | ORAL_TABLET | Freq: Every day | ORAL | Status: AC
  Administered 2014-03-09: 500 mg via ORAL
  Filled 2014-03-09: qty 1

## 2014-03-09 MED ORDER — ACETAMINOPHEN 325 MG PO TABS
650.0000 mg | ORAL_TABLET | Freq: Four times a day (QID) | ORAL | Status: DC | PRN
  Administered 2014-03-11: 650 mg via ORAL
  Filled 2014-03-09 (×2): qty 2

## 2014-03-09 MED ORDER — SODIUM CHLORIDE 0.9 % IV SOLN
INTRAVENOUS | Status: AC
  Administered 2014-03-09: 05:00:00 via INTRAVENOUS

## 2014-03-09 MED ORDER — AZITHROMYCIN 250 MG PO TABS
250.0000 mg | ORAL_TABLET | Freq: Every day | ORAL | Status: DC
  Administered 2014-03-10 – 2014-03-11 (×2): 250 mg via ORAL
  Filled 2014-03-09 (×2): qty 1

## 2014-03-09 MED ORDER — IOHEXOL 300 MG/ML  SOLN
80.0000 mL | Freq: Once | INTRAMUSCULAR | Status: AC | PRN
  Administered 2014-03-09: 80 mL via INTRAVENOUS

## 2014-03-09 MED ORDER — MORPHINE SULFATE 4 MG/ML IJ SOLN
4.0000 mg | Freq: Once | INTRAMUSCULAR | Status: AC
  Administered 2014-03-09: 4 mg via INTRAVENOUS
  Filled 2014-03-09: qty 1

## 2014-03-09 MED ORDER — SODIUM CHLORIDE 0.9 % IJ SOLN
3.0000 mL | INTRAMUSCULAR | Status: DC | PRN
Start: 2014-03-09 — End: 2014-03-15

## 2014-03-09 MED ORDER — IOHEXOL 300 MG/ML  SOLN
75.0000 mL | Freq: Once | INTRAMUSCULAR | Status: AC | PRN
  Administered 2014-03-09: 75 mL via INTRAVENOUS

## 2014-03-09 MED ORDER — ACETAMINOPHEN 650 MG RE SUPP: 650.0000 mg | Freq: Four times a day (QID) | RECTAL | Status: DC | PRN

## 2014-03-09 MED ORDER — FUROSEMIDE 10 MG/ML IJ SOLN
20.0000 mg | Freq: Once | INTRAMUSCULAR | Status: AC
  Administered 2014-03-09: 20 mg via INTRAVENOUS
  Filled 2014-03-09: qty 2

## 2014-03-09 MED ORDER — CEPASTAT 14.5 MG MT LOZG
1.0000 | LOZENGE | OROMUCOSAL | Status: DC | PRN
  Administered 2014-03-09: 1 via BUCCAL
  Filled 2014-03-09 (×2): qty 9

## 2014-03-09 MED ORDER — ENOXAPARIN SODIUM 40 MG/0.4ML ~~LOC~~ SOLN
40.0000 mg | SUBCUTANEOUS | Status: DC
Start: 2014-03-09 — End: 2014-03-11
  Administered 2014-03-09 – 2014-03-10 (×2): 40 mg via SUBCUTANEOUS
  Filled 2014-03-09 (×3): qty 0.4

## 2014-03-09 MED ORDER — SODIUM CHLORIDE 0.9 % IJ SOLN
3.0000 mL | Freq: Two times a day (BID) | INTRAMUSCULAR | Status: DC
  Administered 2014-03-09 – 2014-03-13 (×8): 3 mL via INTRAVENOUS

## 2014-03-09 NOTE — Progress Notes (Signed)
Report received from Melanie, RN

## 2014-03-09 NOTE — ED Notes (Signed)
Per telephone interpreter - pt c/o bilat hand and foot edema x1 month - pt w/ +1 pitting edema. Pt also reports neck and throat pain that began this a.m. - denies any fever or associating symptoms.

## 2014-03-09 NOTE — Progress Notes (Signed)
*  PRELIMINARY RESULTS* Echocardiogram 2D Echocardiogram has been performed.  Wendy Kaufman, Wendy Kaufman 03/09/2014, 11:04 AM

## 2014-03-09 NOTE — Progress Notes (Signed)
Utilization review completed.  

## 2014-03-09 NOTE — Progress Notes (Signed)
Pt admitted to the unit. Pt is alert and oriented. Spanish speaking only. Pt oriented to room, staff, and call bell. Bed in lowest position. Full assessment to Epic. Call bell with in reach. Told to call for assists. Will continue to monitor.  Alecea Trego E

## 2014-03-09 NOTE — H&P (Signed)
Wendy Kaufman is an 28 y.o. female.   Chief Complaint: edema HPI: 28 yo female with generalized edema.  Pt states that the edema has been going on for over several weeks. Pt also has sore neck.  Pt denies fever, chills, cp, palp, sob, orthopnea, pnd.  Pt has lower ext edema.  Pt can't recall any hx of blood clots. Pt will be admitted for generalized edema.   Past Medical History  Diagnosis Date  . Medical history non-contributory     History reviewed. No pertinent past surgical history.  History reviewed. No pertinent family history. Social History:  reports that she has never smoked. She has never used smokeless tobacco. She reports that she does not drink alcohol or use illicit drugs.  Allergies: No Known Allergies   (Not in a hospital admission)  Results for orders placed during the hospital encounter of 03/09/14 (from the past 48 hour(s))  RAPID STREP SCREEN     Status: None   Collection Time    03/08/14 11:20 PM      Result Value Ref Range   Streptococcus, Group A Screen (Direct) NEGATIVE  NEGATIVE   Comment: (NOTE)     A Rapid Antigen test may result negative if the antigen level in the     sample is below the detection level of this test. The FDA has not     cleared this test as a stand-alone test therefore the rapid antigen     negative result has reflexed to a Group A Strep culture.  URINALYSIS, ROUTINE W REFLEX MICROSCOPIC     Status: Abnormal   Collection Time    03/08/14 11:20 PM      Result Value Ref Range   Color, Urine YELLOW  YELLOW   APPearance CLOUDY (*) CLEAR   Specific Gravity, Urine 1.015  1.005 - 1.030   pH 5.5  5.0 - 8.0   Glucose, UA NEGATIVE  NEGATIVE mg/dL   Hgb urine dipstick SMALL (*) NEGATIVE   Bilirubin Urine NEGATIVE  NEGATIVE   Ketones, ur NEGATIVE  NEGATIVE mg/dL   Protein, ur 30 (*) NEGATIVE mg/dL   Urobilinogen, UA 0.2  0.0 - 1.0 mg/dL   Nitrite NEGATIVE  NEGATIVE   Leukocytes, UA SMALL (*) NEGATIVE  URINE MICROSCOPIC-ADD ON      Status: Abnormal   Collection Time    03/08/14 11:20 PM      Result Value Ref Range   Squamous Epithelial / LPF MANY (*) RARE   WBC, UA 3-6  <3 WBC/hpf   RBC / HPF 3-6  <3 RBC/hpf   Bacteria, UA RARE  RARE   Urine-Other MUCOUS PRESENT    CBC     Status: Abnormal   Collection Time    03/09/14 12:11 AM      Result Value Ref Range   WBC 4.9  4.0 - 10.5 K/uL   RBC 3.99  3.87 - 5.11 MIL/uL   Hemoglobin 10.8 (*) 12.0 - 15.0 g/dL   HCT 31.8 (*) 36.0 - 46.0 %   MCV 79.7  78.0 - 100.0 fL   MCH 27.1  26.0 - 34.0 pg   MCHC 34.0  30.0 - 36.0 g/dL   RDW 17.1 (*) 11.5 - 15.5 %   Platelets 355  150 - 400 K/uL  BASIC METABOLIC PANEL     Status: Abnormal   Collection Time    03/09/14 12:11 AM      Result Value Ref Range   Sodium 130 (*) 137 -  147 mEq/L   Potassium 4.3  3.7 - 5.3 mEq/L   Chloride 100  96 - 112 mEq/L   CO2 18 (*) 19 - 32 mEq/L   Glucose, Bld 87  70 - 99 mg/dL   BUN 17  6 - 23 mg/dL   Creatinine, Ser 0.65  0.50 - 1.10 mg/dL   Calcium 8.2 (*) 8.4 - 10.5 mg/dL   GFR calc non Af Amer >90  >90 mL/min   GFR calc Af Amer >90  >90 mL/min   Comment: (NOTE)     The eGFR has been calculated using the CKD EPI equation.     This calculation has not been validated in all clinical situations.     eGFR's persistently <90 mL/min signify possible Chronic Kidney     Disease.   Anion gap 12  5 - 15  PRO B NATRIURETIC PEPTIDE     Status: Abnormal   Collection Time    03/09/14 12:11 AM      Result Value Ref Range   Pro B Natriuretic peptide (BNP) 431.5 (*) 0 - 125 pg/mL  HEPATIC FUNCTION PANEL     Status: Abnormal   Collection Time    03/09/14 12:11 AM      Result Value Ref Range   Total Protein 8.1  6.0 - 8.3 g/dL   Albumin 3.0 (*) 3.5 - 5.2 g/dL   AST 27  0 - 37 U/L   ALT 15  0 - 35 U/L   Alkaline Phosphatase 138 (*) 39 - 117 U/L   Total Bilirubin 0.4  0.3 - 1.2 mg/dL   Bilirubin, Direct <0.2  0.0 - 0.3 mg/dL   Indirect Bilirubin NOT CALCULATED  0.3 - 0.9 mg/dL  LIPASE, BLOOD      Status: None   Collection Time    03/09/14 12:11 AM      Result Value Ref Range   Lipase 33  11 - 59 U/L  I-STAT TROPOININ, ED     Status: None   Collection Time    03/09/14 12:21 AM      Result Value Ref Range   Troponin i, poc 0.00  0.00 - 0.08 ng/mL   Comment 3            Comment: Due to the release kinetics of cTnI,     a negative result within the first hours     of the onset of symptoms does not rule out     myocardial infarction with certainty.     If myocardial infarction is still suspected,     repeat the test at appropriate intervals.  I-STAT CG4 LACTIC ACID, ED     Status: None   Collection Time    03/09/14  2:55 AM      Result Value Ref Range   Lactic Acid, Venous 0.78  0.5 - 2.2 mmol/L   Dg Chest 2 View  03/09/2014   CLINICAL DATA:  Chest pain and difficulty swallowing.  EXAM: CHEST  2 VIEW  COMPARISON:  None.  FINDINGS: The cardiac silhouette, mediastinal and hilar contours are within normal limits. There are bilateral lower lobe infiltrates and right greater than left pleural effusions. Fairly significant bronchitic changes are also noted. The bony thorax is intact.  IMPRESSION: Extensive bronchitic changes and bibasilar infiltrates and small effusions, right greater than left.   Electronically Signed   By: Kalman Jewels M.D.   On: 03/09/2014 01:50   Ct Soft Tissue Neck W Contrast  03/09/2014  CLINICAL DATA:  Neck pain.  EXAM: CT NECK WITH CONTRAST  TECHNIQUE: Multidetector CT imaging of the neck was performed using the standard protocol following the bolus administration of intravenous contrast.  CONTRAST:  39m OMNIPAQUE IOHEXOL 300 MG/ML  SOLN  COMPARISON:  None.  FINDINGS: Low-density expansion of the retropharyngeal space extending from the level of the anterior ring of C1 to C4. There is no peripheral enhancement suggestive of abscess. No cavitation within the lateral retropharyngeal nodes. The adenoid tonsil is enlarged and hyper enhancing, but there is no  notable enlargement of the palatine tonsils. Diffuse mild nodal enlargement symmetrically throughout the neck. There is also bilateral axillary lymphadenopathy which is partially visualized. Axillary lymphadenopathy is for a solitary pharyngeal infection.  Prominent size of the bilateral parotid glands, without definite inflammation. There are 2 punctate calcifications within the bilateral glands. No evidence of mass along the surfaces of the aerodigestive tract. Major cervical vessels are patent. The thyroid gland is unremarkable. There is mild inflammatory mucosal thickening within the bilateral maxillary sinuses.  There is a partially visualized layering right pleural effusion which is at least moderate volume to be visible.  IMPRESSION: 1. Retropharyngeal edema, usually related to complicated pharyngitis/tonsillitis. 2. Diffuse cervical lymphadenopathy which could be related to #1, although the presence of bilateral axillary adenopathy and a moderate right pleural effusion implicates a systemic illness the could be infectious, inflammatory, or lymphoproliferative.   Electronically Signed   By: JJorje GuildM.D.   On: 03/09/2014 02:42    ROS negative for all organ systems except for + above  Blood pressure 125/80, pulse 108, temperature 100.9 F (38.3 C), temperature source Rectal, resp. rate 22, SpO2 97.00%, unknown if currently breastfeeding. Physical Exam   Assessment/Plan Edema Check tsh, check echo, check u protein /creatinine ratio  Tachycardia: cycle cardiac markers, check tsh, check echo  Hyponatremia:  Check serum osm, tsh cortisol, and urine osm, urine sodium  Adenopathy: see CT scan report. Check LDH  Anemia: repeat cbc in am  KJani Gravel8/19/2015, 4:06 AM

## 2014-03-09 NOTE — Progress Notes (Signed)
PROGRESS NOTE  Wendy Kaufman:[id number] DOB: Mar 21, 1986 DOA: 03/09/2014 PCP: No primary provider on file.  Interim summary 28 year old Hispanic female with known chronic medical problems. She presents with two-week history of increasing bilateral upper and lower extremity edema. She denied any fevers or chills at home. She denies any recent travels. She was born in [place], but has been in [place] for 9 years. In addition, the patient complains of one to two-day history of sore throat, but she is able to swallow her saliva, although she states that there is some pain with swallowing. She denies any headache, chest discomfort, shortness breath, coughing, hemoptysis, abdominal pain, dysuria, hematuria. She denies any previous history of sexual transmitted diseases or vaginal discharge. CT of the neck revealed hyper enhancing enlarged adenoids with retropharyngeal edema with bilateral prominent parotids. Assessment/Plan: Diffuse adenopathy -Etiology unclear -Obtain HIV antibody, HIV RNA -CT chest -LDH is normal Diffuse edema -Etiology is unclear -Urine protein/creatinine ratio -Partly attributable to the patient's low albumin and third spacing -Await echocardiogram -Upper extremity duplex negative for DVT, but shows reactive lymph nodes Sinus tachycardia/SIRS -Improved with IV hydration -Secondary to fever and possible infectious process -pt with low grade fever -blood cultures Right wrist pain and swelling -X-ray of right wrist -Uric acid Microcytic anemia -Check iron studies  Family Communication:  Boyfriend at beside Disposition Plan:   Home when medically stable     Antibiotics:  Clindamycin x 1 dose  zithromax 8/19>>>    Procedures/Studies: Ct Abdomen Pelvis Wo Contrast  02/13/2014   CLINICAL DATA:  Right flank pain and hematuria. Lower back pain. Bilateral hand and foot swelling for 3 days.  EXAM: CT ABDOMEN AND PELVIS WITHOUT CONTRAST   TECHNIQUE: Multidetector CT imaging of the abdomen and pelvis was performed following the standard protocol without IV contrast.  COMPARISON:  Pelvic ultrasound performed 12/25/2012  FINDINGS: Trace bilateral pleural effusions are seen, with mild interstitial prominence. This could reflect minimal interstitial edema.  The liver and spleen are unremarkable in appearance. The gallbladder is within normal limits. The pancreas and adrenal glands are unremarkable.  The kidneys are unremarkable in appearance. There is no evidence of hydronephrosis. No renal or ureteral stones are seen. No perinephric stranding is appreciated.  No free fluid is identified. The small bowel is unremarkable in appearance. The stomach is within normal limits. No acute vascular abnormalities are seen.  Mild soft tissue edema is noted along the lateral abdominal wall bilaterally.  The appendix is normal in caliber and contains air, without evidence for appendicitis. The colon is unremarkable in appearance.  The bladder is mildly distended and grossly unremarkable. The uterus is within normal limits. The ovaries are relatively symmetric. No suspicious adnexal masses are seen. A small right ovarian follicle is noted.  Scattered prominent bilateral inguinal nodes are seen, measuring up to 1.5 cm in short axis. These are of uncertain significance.  No acute osseous abnormalities are identified.  IMPRESSION: 1. No acute abnormalities seen to explain the patient's symptoms. 2. Trace bilateral pleural effusions, with mild interstitial prominence. This could reflect minimal pulmonary edema. 3. Mild soft tissue edema along the lateral abdominal wall bilaterally. 4. Scattered prominent bilateral inguinal nodes, measuring up to 1.5 cm in short axis. These are of uncertain significance, though the largest node, on the right side, has a relatively benign appearance.   Electronically Signed   By: Roanna Raider M.D.   On: 02/13/2014 04:14   Dg  Chest 2  View  03/09/2014   CLINICAL DATA:  Chest pain and difficulty swallowing.  EXAM: CHEST  2 VIEW  COMPARISON:  None.  FINDINGS: The cardiac silhouette, mediastinal and hilar contours are within normal limits. There are bilateral lower lobe infiltrates and right greater than left pleural effusions. Fairly significant bronchitic changes are also noted. The bony thorax is intact.  IMPRESSION: Extensive bronchitic changes and bibasilar infiltrates and small effusions, right greater than left.   Electronically Signed   By: Loralie ChampagneMark  Gallerani M.D.   On: 03/09/2014 01:50   Ct Soft Tissue Neck W Contrast  03/09/2014   CLINICAL DATA:  Neck pain.  EXAM: CT NECK WITH CONTRAST  TECHNIQUE: Multidetector CT imaging of the neck was performed using the standard protocol following the bolus administration of intravenous contrast.  CONTRAST:  75mL OMNIPAQUE IOHEXOL 300 MG/ML  SOLN  COMPARISON:  None.  FINDINGS: Low-density expansion of the retropharyngeal space extending from the level of the anterior ring of C1 to C4. There is no peripheral enhancement suggestive of abscess. No cavitation within the lateral retropharyngeal nodes. The adenoid tonsil is enlarged and hyper enhancing, but there is no notable enlargement of the palatine tonsils. Diffuse mild nodal enlargement symmetrically throughout the neck. There is also bilateral axillary lymphadenopathy which is partially visualized. Axillary lymphadenopathy is for a solitary pharyngeal infection.  Prominent size of the bilateral parotid glands, without definite inflammation. There are 2 punctate calcifications within the bilateral glands. No evidence of mass along the surfaces of the aerodigestive tract. Major cervical vessels are patent. The thyroid gland is unremarkable. There is mild inflammatory mucosal thickening within the bilateral maxillary sinuses.  There is a partially visualized layering right pleural effusion which is at least moderate volume to be visible.  IMPRESSION:  1. Retropharyngeal edema, usually related to complicated pharyngitis/tonsillitis. 2. Diffuse cervical lymphadenopathy which could be related to #1, although the presence of bilateral axillary adenopathy and a moderate right pleural effusion implicates a systemic illness the could be infectious, inflammatory, or lymphoproliferative.   Electronically Signed   By: Tiburcio PeaJonathan  Watts M.D.   On: 03/09/2014 02:42         Subjective: Patient denies any fever, chills, chest pain shortness breath, nausea, vomiting, diarrhea. She is able to swallow food with minimal pain. She denies any headache, neck pain, abdominal pain, rashes.   Objective: Filed Vitals:   03/09/14 0339 03/09/14 0400 03/09/14 0430 03/09/14 0522  BP: 125/80 121/83 118/81 122/85  Pulse: 108 105 110 111  Temp:    99.4 F (37.4 C)  TempSrc:    Oral  Resp: 22 23 23 24   Height:    5' (1.524 m)  Weight:    58.922 kg (129 lb 14.4 oz)  SpO2: 97% 96% 96% 98%    Intake/Output Summary (Last 24 hours) at 03/09/14 1255 Last data filed at 03/09/14 0500  Gross per 24 hour  Intake   1000 ml  Output   1000 ml  Net      0 ml   Weight change:  Exam:   General:  Pt is alert, follows commands appropriately, not in acute distress  HEENT: No icterus, No thrush, bilateral cervical adenopathy [place]/AT  Cardiovascular: RRR, S1/S2, no rubs, no gallops  Respiratory: Bibasilar crackles, right greater than left. No wheezing. Good air movement   Abdomen: Soft/+BS, non tender, non distended, no guarding  Extremities:1+ edema bilateral upper and lower extremity.   Data Reviewed: Basic Metabolic Panel:  Recent Labs Lab 03/09/14  0011 03/09/14 0613  NA 130* 136*  K 4.3 4.0  CL 100 105  CO2 18* 18*  GLUCOSE 87 92  BUN 17 15  CREATININE 0.65 0.57  CALCIUM 8.2* 7.8*   Liver Function Tests:  Recent Labs Lab 03/09/14 0011 03/09/14 0613  AST 27 19  ALT 15 12  ALKPHOS 138* 118*  BILITOT 0.4 0.3  PROT 8.1 7.4  ALBUMIN 3.0* 2.6*     Recent Labs Lab 03/09/14 0011  LIPASE 33   No results found for this basename: AMMONIA,  in the last 168 hours CBC:  Recent Labs Lab 03/09/14 0011 03/09/14 0613  WBC 4.9 4.4  NEUTROABS  --  3.0  HGB 10.8* 10.3*  HCT 31.8* 30.3*  MCV 79.7 79.3  PLT 355 365   Cardiac Enzymes:  Recent Labs Lab 03/09/14 0613  TROPONINI <0.30   BNP: No components found with this basename: POCBNP,  CBG: No results found for this basename: GLUCAP,  in the last 168 hours  Recent Results (from the past 240 hour(s))  RAPID STREP SCREEN     Status: None   Collection Time    03/08/14 11:20 PM      Result Value Ref Range Status   Streptococcus, Group A Screen (Direct) NEGATIVE  NEGATIVE Final   Comment: (NOTE)     A Rapid Antigen test may result negative if the antigen level in the     sample is below the detection level of this test. The FDA has not     cleared this test as a stand-alone test therefore the rapid antigen     negative result has reflexed to a Group A Strep culture.     Scheduled Meds: . sodium chloride   Intravenous STAT  . [START ON 03/10/2014] azithromycin  250 mg Oral Daily  . enoxaparin (LOVENOX) injection  40 mg Subcutaneous Q24H  . sodium chloride  3 mL Intravenous Q12H   Continuous Infusions:    Savannaha Stonerock, DO  [facility] Pager [phone]  If 7PM-7AM, please contact night-coverage [email or web address] Password [email or web address] 03/09/2014, 12:55 PM   LOS: 0 days

## 2014-03-09 NOTE — ED Provider Notes (Signed)
CSN: [id number]     Arrival date & time 03/08/14  2259 History   First MD Initiated Contact with Patient 03/09/14 0058     Chief Complaint  Patient presents with  . Neck Pain    The patient is compalinng of neck pain and sore throat.  The patient said she is unable to eat due to the pain because she cannot swallow.     (Consider location/radiation/quality/duration/timing/severity/associated sxs/prior Treatment) HPI Comments: Patient presents with swelling to her hands and feet since July 23. She was seen in ED on July 26 and had a negative workup. She reports the pain and swelling have persisted. He denies any change. Denies any fever. She endorses diffuse sore throat over the past 2 days. She has pain with swallowing and nausea. She endorses a gradual onset headache as well. Denies any abdominal pain, chest pain,  shortness of breath. denies any flank pain. Denies any focal weakness, numbness or tingling. Denies any bowel or bladder incontinence. She states she is not eating  because it hurts to swallow. During her last ED visit she was prescribed as needed Lasix as well as compression stockings which she states she's been using.  The history is provided by the patient. The history is limited by the condition of the patient and a language barrier. A language interpreter was used.    Past Medical History  Diagnosis Date  . Medical history non-contributory    History reviewed. No pertinent past surgical history. History reviewed. No pertinent family history. History  Substance Use Topics  . Smoking status: Never Smoker   . Smokeless tobacco: Never Used  . Alcohol Use: No   OB History   Grav Para Term Preterm Abortions TAB SAB Ect Mult Living   3 3 3  0 0 0 0 0 0 3     Review of Systems  Constitutional: Positive for activity change, appetite change and fatigue. Negative for fever.  HENT: Positive for sore throat. Negative for congestion and rhinorrhea.   Respiratory: Negative for  cough, chest tightness and shortness of breath.   Cardiovascular: Positive for leg swelling. Negative for chest pain.  Gastrointestinal: Negative for nausea, vomiting and abdominal pain.  Genitourinary: Negative for dysuria, hematuria, vaginal bleeding and vaginal discharge.  Musculoskeletal: Positive for joint swelling and neck pain.  Skin: Negative for rash.  Neurological: Positive for weakness and headaches. Negative for dizziness and light-headedness.  A complete 10 system review of systems was obtained and all systems are negative except as noted in the HPI and PMH.      Allergies  Review of patient's allergies indicates no known allergies.  Home Medications   Prior to Admission medications   Not on File   BP 122/85  Pulse 111  Temp(Src) 99.4 F (37.4 C) (Oral)  Resp 24  Ht 5' (1.524 m)  Wt 129 lb 14.4 oz (58.922 kg)  BMI 25.37 kg/m2  SpO2 98% Physical Exam  Nursing note and vitals reviewed. Constitutional: She is oriented to person, place, and time. She appears well-developed and well-nourished. No distress.  HENT:  Head: Normocephalic and atraumatic.  Mouth/Throat: No oropharyngeal exudate.  Erythematous oropharynx. No asymmetry. No exudate. Floor of mouth soft  Eyes: Conjunctivae and EOM are normal. Pupils are equal, round, and reactive to light.  Neck: Normal range of motion. Neck supple.  No meningismus. Full range of motion without pain  Cardiovascular: Normal rate, regular rhythm, normal heart sounds and intact distal pulses.   No murmur heard. Pulmonary/Chest:  Effort normal and breath sounds normal. No respiratory distress.  Abdominal: Soft. There is no tenderness. There is no rebound and no guarding.  Musculoskeletal: Normal range of motion. She exhibits edema. She exhibits no tenderness.  +2 pitting edema pretibial bilaterally. No calf swelling or calf tenderness. Swelling to the dorsum of bilateral hands no erythema.   Neurological: She is alert and  oriented to person, place, and time. No cranial nerve deficit. She exhibits normal muscle tone. Coordination normal.  No ataxia on finger to nose bilaterally. No pronator drift. 5/5 strength throughout. CN 2-12 intact. Negative Romberg. Equal grip strength. Sensation intact. Gait is normal.   Skin: Skin is warm.  Psychiatric: She has a normal mood and affect. Her behavior is normal.    ED Course  Procedures (including critical care time) Labs Review Labs Reviewed  CBC - Abnormal; Notable for the following:    Hemoglobin 10.8 (*)    HCT 31.8 (*)    RDW 17.1 (*)    All other components within normal limits  BASIC METABOLIC PANEL - Abnormal; Notable for the following:    Sodium 130 (*)    CO2 18 (*)    Calcium 8.2 (*)    All other components within normal limits  URINALYSIS, ROUTINE W REFLEX MICROSCOPIC - Abnormal; Notable for the following:    APPearance CLOUDY (*)    Hgb urine dipstick SMALL (*)    Protein, ur 30 (*)    Leukocytes, UA SMALL (*)    All other components within normal limits  PRO B NATRIURETIC PEPTIDE - Abnormal; Notable for the following:    Pro B Natriuretic peptide (BNP) 431.5 (*)    All other components within normal limits  URINE MICROSCOPIC-ADD ON - Abnormal; Notable for the following:    Squamous Epithelial / LPF MANY (*)    All other components within normal limits  HEPATIC FUNCTION PANEL - Abnormal; Notable for the following:    Albumin 3.0 (*)    Alkaline Phosphatase 138 (*)    All other components within normal limits  COMPREHENSIVE METABOLIC PANEL - Abnormal; Notable for the following:    Sodium 136 (*)    CO2 18 (*)    Calcium 7.8 (*)    Albumin 2.6 (*)    Alkaline Phosphatase 118 (*)    All other components within normal limits  CBC WITH DIFFERENTIAL - Abnormal; Notable for the following:    RBC 3.82 (*)    Hemoglobin 10.3 (*)    HCT 30.3 (*)    RDW 17.1 (*)    All other components within normal limits  RAPID STREP SCREEN  CULTURE, GROUP A  STREP  LIPASE, BLOOD  LACTATE DEHYDROGENASE  TSH  TROPONIN I  PROTEIN / CREATININE RATIO, URINE  CBC  TROPONIN I  TROPONIN I  I-STAT TROPOININ, ED  I-STAT CG4 LACTIC ACID, ED    Imaging Review Dg Chest 2 View  03/09/2014   CLINICAL DATA:  Chest pain and difficulty swallowing.  EXAM: CHEST  2 VIEW  COMPARISON:  None.  FINDINGS: The cardiac silhouette, mediastinal and hilar contours are within normal limits. There are bilateral lower lobe infiltrates and right greater than left pleural effusions. Fairly significant bronchitic changes are also noted. The bony thorax is intact.  IMPRESSION: Extensive bronchitic changes and bibasilar infiltrates and small effusions, right greater than left.   Electronically Signed   By: Loralie Champagne M.D.   On: 03/09/2014 01:50   Ct Soft Tissue Neck W Contrast  BlT[facility] [id number] [facility] Hospital [id number] [id number] Orthop58a6401MarcyEarlene PlaterenrNorthern Nj Endoscopy Center LLCtsond366Carney BerKentuckyn 2513069LucianHu-Hu-Kam [facility])ne Muss578NortheaElvera Lennoxn NeJohnson & JohnsDelphina Cah97i4057Korealna16401Craige CottaEnzo BiLeonSteffanie Dunnoro [address]lph XTTAG>ADTEXTTAG>KoreaCahill W. Baker Lanea est25335 High dTrinna Post130865784De Blanch8mBroward Health North68KentuckySouthern California Stone CenterEarlene PlaterMarcy Siren40102725Legrand [id number] Steffanie DunnLeonia CoronaCraige CottaKorea25632Enzo BiDelphina CahillJohnson & JohnsonElvera LennoxLucianne MussSan Mateo Medical [id number] OrmondTexas Center For Infectious DiseaseCarney [id number] [phone] FermoWilmon Arms [address]51w6dDenyse AmassRavenswoodKorea60w4dTrinna Post130865784De Blanch62mMagnolia Surgery Center LLC74KentuckyTarrant County Surgery Center LPEarlene PlaterMarcy Siren40102725Legrand [id number] Steffanie DunnLeonia CoronaCraige CottaKorea823321Enzo BiDelphina CahillJohnson & JohnsonElvera LennoxLucianne MussHca Houston Healthcare Northwest Medical [id number]

## 2014-03-09 NOTE — Progress Notes (Signed)
*  PRELIMINARY RESULTS* Vascular Ultrasound Upper extremity venous duplex has been completed.  Preliminary findings: No evidence of deep or superficial thrombosis. Multiple enlarged, hypervascular lymph nodes are noted in bilateral supraclavicular and axilla areas. Etiology unknown.   Farrel DemarkJill Eunice, RDMS, RVT  03/09/2014, 11:31 AM

## 2014-03-10 ENCOUNTER — Inpatient Hospital Stay (HOSPITAL_COMMUNITY): Payer: Medicaid Other

## 2014-03-10 DIAGNOSIS — J90 Pleural effusion, not elsewhere classified: Secondary | ICD-10-CM | POA: Diagnosis present

## 2014-03-10 DIAGNOSIS — D509 Iron deficiency anemia, unspecified: Secondary | ICD-10-CM

## 2014-03-10 DIAGNOSIS — R609 Edema, unspecified: Secondary | ICD-10-CM

## 2014-03-10 DIAGNOSIS — R599 Enlarged lymph nodes, unspecified: Secondary | ICD-10-CM

## 2014-03-10 DIAGNOSIS — J039 Acute tonsillitis, unspecified: Secondary | ICD-10-CM

## 2014-03-10 LAB — COMPREHENSIVE METABOLIC PANEL
ALBUMIN: 2.5 g/dL — AB (ref 3.5–5.2)
ALT: 12 U/L (ref 0–35)
AST: 18 U/L (ref 0–37)
Alkaline Phosphatase: 114 U/L (ref 39–117)
Anion gap: 14 (ref 5–15)
BUN: 16 mg/dL (ref 6–23)
CALCIUM: 8.1 mg/dL — AB (ref 8.4–10.5)
CO2: 15 mEq/L — ABNORMAL LOW (ref 19–32)
CREATININE: 0.53 mg/dL (ref 0.50–1.10)
Chloride: 107 mEq/L (ref 96–112)
GFR calc Af Amer: >90 mL/min (ref >90)
GFR calc non Af Amer: >90 mL/min (ref >90)
Glucose, Bld: 76 mg/dL (ref 70–99)
Potassium: 4.2 mEq/L (ref 3.7–5.3)
Sodium: 136 mEq/L — ABNORMAL LOW (ref 137–147)
Total Bilirubin: 0.3 mg/dL (ref 0.3–1.2)
Total Protein: 7.5 g/dL (ref 6.0–8.3)

## 2014-03-10 LAB — MONONUCLEOSIS SCREEN
MONO SCREEN: NEGATIVE
Mono Screen: NEGATIVE

## 2014-03-10 LAB — BODY FLUID CELL COUNT WITH DIFFERENTIAL
Eos, Fluid: 0 %
Lymphs, Fluid: 46 %
MONOCYTE-MACROPHAGE-SEROUS FLUID: 47 % — AB (ref 50–90)
NEUTROPHIL FLUID: 7 % (ref 0–25)
WBC FLUID: 1550 uL — AB (ref 0–1000)

## 2014-03-10 LAB — CULTURE, GROUP A STREP

## 2014-03-10 LAB — CBC WITH DIFFERENTIAL/PLATELET
BASOS ABS: 0.1 10*3/uL (ref 0.0–0.1)
Basophils Relative: 1 % (ref 0–1)
Eosinophils Absolute: 0.1 10*3/uL (ref 0.0–0.7)
Eosinophils Relative: 1 % (ref 0–5)
HCT: 30.5 % — ABNORMAL LOW (ref 36.0–46.0)
Hemoglobin: 10.3 g/dL — ABNORMAL LOW (ref 12.0–15.0)
LYMPHS ABS: 2 10*3/uL (ref 0.7–4.0)
Lymphocytes Relative: 24 % (ref 12–46)
MCH: 27.6 pg (ref 26.0–34.0)
MCHC: 33.8 g/dL (ref 30.0–36.0)
MCV: 81.8 fL (ref 78.0–100.0)
MONO ABS: 0.7 10*3/uL (ref 0.1–1.0)
Monocytes Relative: 8 % (ref 3–12)
Neutro Abs: 5.3 10*3/uL (ref 1.7–7.7)
Neutrophils Relative %: 66 % (ref 43–77)
Platelets: UNDETERMINED 10*3/uL (ref 150–400)
RBC: 3.73 MIL/uL — AB (ref 3.87–5.11)
RDW: 17.1 % — AB (ref 11.5–15.5)
WBC: 8.2 10*3/uL (ref 4.0–10.5)

## 2014-03-10 LAB — LACTIC ACID, PLASMA: LACTIC ACID, VENOUS: 1 mmol/L (ref 0.5–2.2)

## 2014-03-10 LAB — ANTI-NUCLEAR AB-TITER (ANA TITER): ANA Titer 1: 1:2560 {titer}

## 2014-03-10 LAB — ANA: Anti Nuclear Antibody(ANA): POSITIVE — AB

## 2014-03-10 LAB — PROTEIN, TOTAL: Total Protein: 7.6 g/dL (ref 6.0–8.3)

## 2014-03-10 LAB — PROTEIN, BODY FLUID: Total protein, fluid: 5 g/dL

## 2014-03-10 LAB — LACTATE DEHYDROGENASE, PLEURAL OR PERITONEAL FLUID: LD FL: 942 U/L — AB (ref 3–23)

## 2014-03-10 LAB — FERRITIN: Ferritin: 89 ng/mL (ref 10–291)

## 2014-03-10 LAB — KETONES, QUALITATIVE: ACETONE BLD: NEGATIVE

## 2014-03-10 LAB — CHOLESTEROL, TOTAL: Cholesterol: 92 mg/dL (ref 0–200)

## 2014-03-10 LAB — HIV ANTIBODY (ROUTINE TESTING W REFLEX): HIV 1&2 Ab, 4th Generation: NONREACTIVE

## 2014-03-10 IMAGING — CR DG CHEST 1V PORT
1 series · 1 of 1 positions shown · non-contrast
Comparison: CT chest and PA and lateral chest [DATE].

CLINICAL DATA: Status post right thoracentesis.

EXAM:
PORTABLE CHEST - 1 VIEW

[portable]
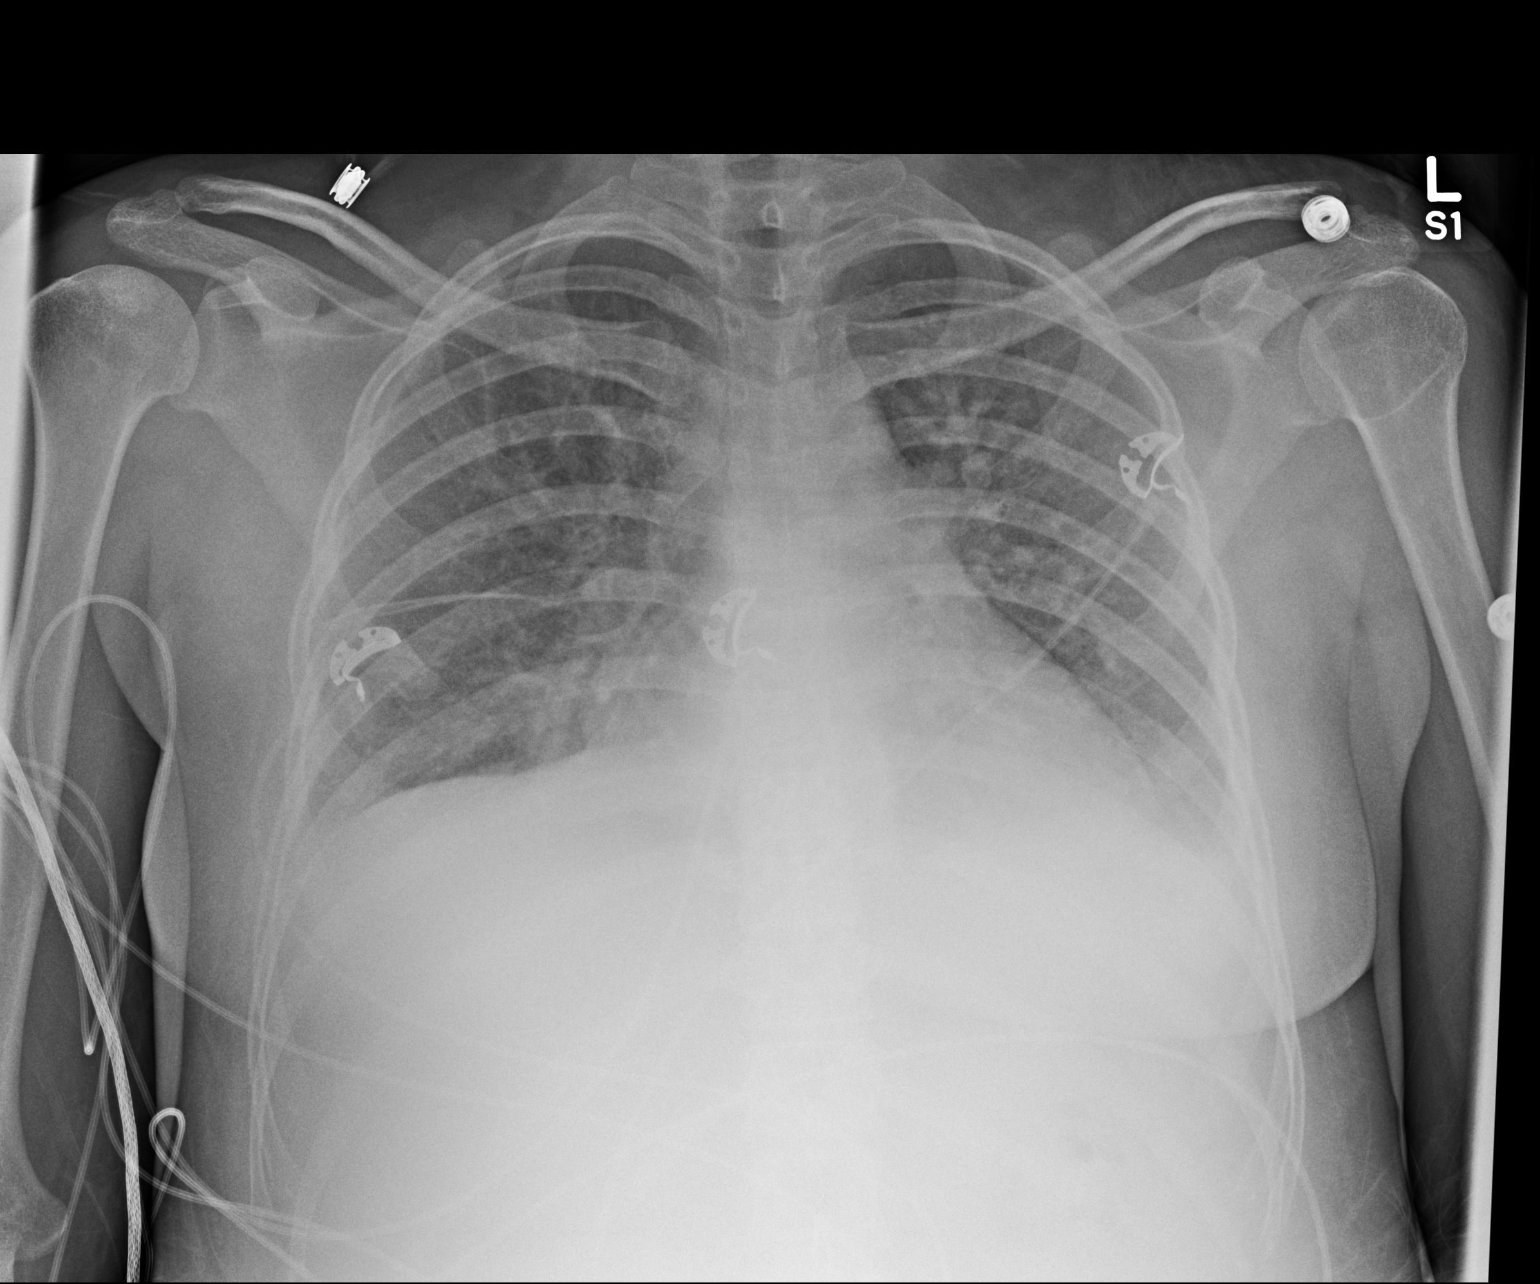

[1 of 1 positions shown; findings below may reference images not displayed]

FINDINGS: Right pleural effusion is decreased after thoracentesis. No
pneumothorax is identified. Left pleural effusion seen on the prior
study appears slightly increased. Bibasilar atelectasis is noted.
IMPRESSION: Negative for pneumothorax after right thoracentesis.

Some increase in a small left pleural effusion.

## 2014-03-10 MED ORDER — SODIUM CHLORIDE 0.9 % IV SOLN
125.0000 mg | Freq: Once | INTRAVENOUS | Status: AC
  Administered 2014-03-10: 125 mg via INTRAVENOUS
  Filled 2014-03-10: qty 10

## 2014-03-10 MED ORDER — FERROUS SULFATE 325 (65 FE) MG PO TABS
325.0000 mg | ORAL_TABLET | Freq: Two times a day (BID) | ORAL | Status: DC
  Administered 2014-03-11 – 2014-03-15 (×9): 325 mg via ORAL
  Filled 2014-03-10 (×11): qty 1

## 2014-03-10 MED ORDER — OXYCODONE HCL 5 MG PO TABS
10.0000 mg | ORAL_TABLET | Freq: Four times a day (QID) | ORAL | Status: DC | PRN
  Administered 2014-03-10 – 2014-03-14 (×6): 10 mg via ORAL
  Filled 2014-03-10 (×6): qty 2

## 2014-03-10 NOTE — Procedures (Signed)
Thoracentesis Procedure Note  Pre-operative Diagnosis: right effusion   Post-operative Diagnosis: same  Indications: evaluation of effusion   Procedure Details  Consent: Informed consent was obtained. Risks of the procedure were discussed including: infection, bleeding, pain, pneumothorax.  Under sterile conditions the patient was positioned. Betadine solution and sterile drapes were utilized.  1% buffered lidocaine was used to anesthetize the pleural space which was identified via real time US. Fluid was obtained without any difficulties and minimal blood loss.  A dressing was applied to the wound and wound care instructions were provided.   Findings 700 ml of clear pleural fluid was obtained. A sample was sent for cytology, cell count, AFB studies, chemistries  Complications:  None; patient tolerated the procedure well.          Condition: stable  I was present for and supervised the entire procedure performed by ACNP Alonza SmokerBabcock  David Simonds, MD ; [facility] service Mobile [phone].  After 5:30 PM or weekends, call [phone]

## 2014-03-10 NOTE — Consult Note (Signed)
Name: Wendy SaltsMaria I Kaufman MRN: [id number] DOB: 05-16-1986    ADMISSION DATE:  03/09/2014 CONSULTATION DATE:  8/20   REFERRING MD :  [facility] PRIMARY SERVICE:  [facility]   CHIEF COMPLAINT/reason for consult Pleural effusion   BRIEF PATIENT DESCRIPTION:  28 year old non-english speaking female [place] who presented on 8/19 w/ CC: 2 wk h/o arm and neck pain w/ associated bilateral Upper and LE edema & 2 d h/o sore throat. Admitted by medical service. Dx eval to date: ECHO w/ nml EF, RA and RV, only gd 1 ddsfxn, HIV was NR, CT chest, As listed above w/ findings of diffuse adenopathy and right effusion. PCCM asked to see for effusion.    Studies  CT chest 8/19: Abnormal increased number of lymph nodes and Mild to moderate abnormal enlargement of lymph nodes in the axillary, subpectoral, internal mammary, infrahilar, mediastinal, and epicardial chains. Moderate right and small left pleural effusions, with suspected pleural enhancement especially anteriorly on the right where there  is loculation of the pleural effusion   SIGNIFICANT EVENTS:  Right thoracentesis 8/20  Site: right  Date:  8/20  Pleural fluid Serum   LDH: LDH:  Protein: Protein:  Cholesterol:  Cholesterol:  Afb:   Gm stain:    Culture:   Fungal:    Hct:    Color:    Wbc:   Neutrophils:    Lymph:    Ph:    Rheumatoid factor:   Adenosine Deaminase:   Glucose:    Cytology:    LE cells from Cytology:    Special notes:        CULTURES: Gp a strep culture 8/19>>> BCX2 8/19>>> QF gold 8/19>>> Monospot 8/19>>> AFB on pleural fluid 8/19>>>   ANTIBIOTICS: azithro 8/19>>>  HISTORY OF PRESENT ILLNESS:   This is a 28 year old non-english speaking female fm [place] who presented on 8/19 w/ CC: 2 wk h/o arm and neck pain w/ associated bilateral Upper and LE edema & 2 d h/o sore throat. Admitted by medical service. Dx eval to date: ECHO w/ nml EF, RA and RV, only gd 1 ddsfxn, HIV was NR, CT chest, As listed  above w/ findings of diffuse adenopathy and right effusion. PCCM asked to see for effusion.   PAST MEDICAL HISTORY :  Past Medical History  Diagnosis Date  . Medical history non-contributory    History reviewed. No pertinent past surgical history. Prior to Admission medications   Not on File   No Known Allergies  FAMILY HISTORY:  History reviewed. No pertinent family history. SOCIAL HISTORY:  reports that she has never smoked. She has never used smokeless tobacco. She reports that she does not drink alcohol or use illicit drugs.  REVIEW OF SYSTEMS:   Constitutional: Negative for fever, chills, weight loss, malaise/fatigue and diaphoresis.  HENT: Negative for hearing loss, ear pain, nosebleeds, congestion, sore throat, neck pain, tinnitus and ear discharge.   Eyes: Negative for blurred vision, double vision, photophobia, pain, discharge and redness.  Respiratory: Negative for cough, hemoptysis, sputum production, shortness of breath, wheezing and stridor.   Cardiovascular: Negative for chest pain, palpitations, orthopnea, claudication, leg swelling and pain, UE swelling and pain and PND.  Gastrointestinal: Negative for heartburn, nausea, vomiting, abdominal pain, diarrhea, constipation, blood in stool and melena.  Genitourinary: Negative for dysuria, urgency, frequency, hematuria and flank pain.  Musculoskeletal: Negative for myalgias, back pain, joint pain and falls.  Skin: Negative for itching and rash.  Neurological: Negative  for dizziness, tingling, tremors, sensory change, speech change, focal weakness, seizures, loss of consciousness, weakness and headaches.  Endo/Heme/Allergies: Negative for environmental allergies and polydipsia. Does not bruise/bleed easily.  SUBJECTIVE: no acute distress.   VITAL SIGNS: Temp:  [98.7 F (37.1 C)-100.3 F (37.9 C)] 98.9 F (37.2 C) (08/20 0528) Pulse Rate:  [102-108] 102 (08/20 0528) Resp:  [20] 20 (08/20 0528) BP: (102-115)/(71-75)  115/74 mmHg (08/20 0528) SpO2:  [97 %] 97 % (08/20 0528) Weight:  [59 kg (130 lb 1.1 oz)] 59 kg (130 lb 1.1 oz) (08/20 0528)  PHYSICAL EXAMINATION: General:  Awake, alert no focal def  Neuro:  No focal def  HEENT:  [place], no JVD  Cardiovascular:  rrr Lungs:  Decreased on right  Abdomen:  Soft, non-tender  Musculoskeletal:  Intact  Skin:  Upper and LE edema    Recent Labs Lab 03/09/14 0011 03/09/14 0613 03/10/14 0615  NA 130* 136* 136*  K 4.3 4.0 4.2  CL 100 105 107  CO2 18* 18* 15*  BUN 17 15 16   CREATININE 0.65 0.57 0.53  GLUCOSE 87 92 76    Recent Labs Lab 03/09/14 0011 03/09/14 0613 03/10/14 0615  HGB 10.8* 10.3* 10.3*  HCT 31.8* 30.3* 30.5*  WBC 4.9 4.4 8.2  PLT 355 365 PLATELET CLUMPS NOTED ON SMEAR, UNABLE TO ESTIMATE   Dg Chest 2 View  03/09/2014   CLINICAL DATA:  Chest pain and difficulty swallowing.  EXAM: CHEST  2 VIEW  COMPARISON:  None.  FINDINGS: The cardiac silhouette, mediastinal and hilar contours are within normal limits. There are bilateral lower lobe infiltrates and right greater than left pleural effusions. Fairly significant bronchitic changes are also noted. The bony thorax is intact.  IMPRESSION: Extensive bronchitic changes and bibasilar infiltrates and small effusions, right greater than left.   Electronically Signed   By: Loralie Champagne M.D.   On: 03/09/2014 01:50   Dg Wrist 2 Views Right  03/09/2014   CLINICAL DATA:  Pain and swelling.  EXAM: RIGHT WRIST - 2 VIEW  COMPARISON:  None.  FINDINGS: There is no evidence of fracture or dislocation. There is no evidence of arthropathy or other focal bone abnormality. Soft tissues are unremarkable.  IMPRESSION: Negative.   Electronically Signed   By: Kennith Center M.D.   On: 03/09/2014 14:29   Ct Soft Tissue Neck W Contrast  03/09/2014   CLINICAL DATA:  Neck pain.  EXAM: CT NECK WITH CONTRAST  TECHNIQUE: Multidetector CT imaging of the neck was performed using the standard protocol following the bolus  administration of intravenous contrast.  CONTRAST:  75mL OMNIPAQUE IOHEXOL 300 MG/ML  SOLN  COMPARISON:  None.  FINDINGS: Low-density expansion of the retropharyngeal space extending from the level of the anterior ring of C1 to C4. There is no peripheral enhancement suggestive of abscess. No cavitation within the lateral retropharyngeal nodes. The adenoid tonsil is enlarged and hyper enhancing, but there is no notable enlargement of the palatine tonsils. Diffuse mild nodal enlargement symmetrically throughout the neck. There is also bilateral axillary lymphadenopathy which is partially visualized. Axillary lymphadenopathy is for a solitary pharyngeal infection.  Prominent size of the bilateral parotid glands, without definite inflammation. There are 2 punctate calcifications within the bilateral glands. No evidence of mass along the surfaces of the aerodigestive tract. Major cervical vessels are patent. The thyroid gland is unremarkable. There is mild inflammatory mucosal thickening within the bilateral maxillary sinuses.  There is a partially visualized layering right pleural effusion which  is at least moderate volume to be visible.  IMPRESSION: 1. Retropharyngeal edema, usually related to complicated pharyngitis/tonsillitis. 2. Diffuse cervical lymphadenopathy which could be related to #1, although the presence of bilateral axillary adenopathy and a moderate right pleural effusion implicates a systemic illness the could be infectious, inflammatory, or lymphoproliferative.   Electronically Signed   By: Tiburcio Pea M.D.   On: 03/09/2014 02:42   Ct Chest W Contrast  03/09/2014   CLINICAL DATA:  Pleural effusions and adenopathy in the chest  EXAM: CT CHEST WITH CONTRAST  TECHNIQUE: Multidetector CT imaging of the chest was performed during intravenous contrast administration.  CONTRAST:  80mL OMNIPAQUE IOHEXOL 300 MG/ML  SOLN  COMPARISON:  Multiple exams, including 03/09/2014 and 02/13/2014  FINDINGS: Bilateral  axillary and subpectoral adenopathy, index left axillary lymph node 1.5 cm on image 15 of series 2. Clustered supraclavicular lymph nodes, with a posterior node measuring 0.9 cm on image 6 of series 2.  Right upper paratracheal lymph node 0.7 cm in short axis. Multiple additional mediastinal lymph nodes are present. 9 mm left infrahilar lymph node. Rim calcified of right infrahilar lymph node.  Bilateral internal mammary adenopathy with a right internal mammary noted 0.8 cm on image 29 series 2.  Moderate to large right pleural effusion with a loculation anteriorly. Mild enhancement along the parietal pleura, especially anteriorly on the right near the loculation. Right epicardial lymph node 0.9 cm in short axis, image 41 series 2. Small left pleural effusion. Bilateral passive atelectasis.  Trace pericardial effusion.  I suspect a right glenohumeral joint effusion. No nodules or edema within the aerated lung.  IMPRESSION: 1. Abnormal increased number of lymph nodes and Mild to moderate abnormal enlargement of lymph nodes in the axillary, subpectoral, internal mammary, infrahilar, mediastinal, and epicardial chains could be due to diffuse reactive adenopathy or lymphatic malignancy. Tissue sampling is likely warranted. 2. Moderate right and small left pleural effusions, with suspected pleural enhancement especially anteriorly on the right where there is loculation of the pleural effusion. This raises suspicion that the right pleural effusion is exudative. 3. Trace pericardial effusion. 4. Suspected right glenohumeral joint effusion.   Electronically Signed   By: Herbie Baltimore M.D.   On: 03/09/2014 15:01    ASSESSMENT / PLAN:  Moderate sized R pleural effusion w/ small anterior area of loculation (not amendable to sampling). Now s/p thoracentesis (700 cc clear pleural fluid 8/20) Diffuse adenopathy  Diffuse edema ? Lymphedema  Metabolic acidosis (mild AG)   Discussion Etiology/ unifying diagnosis  unclear at this point. ? Underlying infection: Tb? Or Mono (mon being less likely). ? Auto-immune? ANA was positive so lupus associated effusion would be a consideration. Also need to consider malignancy.   recs -cont current work up per medical team -have added QF gold and mono-spot -have sent extensive Pleural fluid analysis (see table above)  -in addition to typical studies have also asked that cytology include LE cells.  -if effusion is non-diagnostic then would have surg bx a LN.  -check serum ketones and Lactic acid re: + AG  Seen with ACNP Alonza Smoker, MD ; [facility] service Mobile [phone].  After 5:30 PM or weekends, call [phone]  03/10/2014, 10:22 AM

## 2014-03-10 NOTE — Consult Note (Signed)
Patient underwent thoracentesis today.  If cytology is not helpful, we can consider right axillary sentinel lymph node biopsy tomorrow.  Keep her NPO p MN.  Wilmon ArmsMatthew K. Corliss Skainssuei, MD, [facility] IncFACS [facility]  General/ Trauma Surgery  03/10/2014 4:26 PM

## 2014-03-10 NOTE — Progress Notes (Signed)
Interpreter Wyvonnia DuskyGraciela Namihira for Amina NP

## 2014-03-10 NOTE — Progress Notes (Signed)
PROGRESS NOTE  Wendy Kaufman BTD:[id number] DOB: [birth year] DOA: 03/09/2014 PCP: No primary provider on file.  Assessment/Plan: Diffuse adenopathy with positive ANA (1:2560) -Etiology unclear neoplasm vs vasculitis vs infection -Obtain HIV antibody--neg -HIV RNA-pending -CT chest--bilateral axillary and subpectoral lymph nodes, moderate right-sided loculated pleural effusion, paratracheal, hilar, and mediastinal lymph nodes  -LDH is normal  -check C3, C4, CH50, anti-RNP, dsDNA, lupus anticoagulant, anti-SSA, anti-SSB, RF, ESR, CRP -consulted surgery for excisional biopsy of LN -discussed with oncology--need whole lymph node to best make diagnosis of lymphoma -doubt toxo -await IGRA results Pleural effusion -Appreciate pulmonary -03/10/2014 thoracocentesis-600cc removed -WBC 1550 (94% mono) -fluid appears exudative -await cytology and other studies Diffuse edema  -Etiology is unclear  -Urine protein/creatinine ratio--0.52 not consistent with nephrotic syndrome  -Partly attributable to the patient's low albumin and third spacing  -echocardiogram--EF 36-46%, grade 1 diastolic dysfunction  -Upper extremity duplex negative for DVT, but shows reactive lymph nodes  Sinus tachycardia/SIRS  -Improved with IV hydration  -Secondary to fever and possible infectious process  -pt with low grade fever  -blood cultures--negative to date  Right wrist pain and swelling  -X-ray of right wrist--non acute -Uric acid--6.7 Iron deficiency anemia  -Check iron studies--iron saturation undetectable -Start iron supplementation   Family Communication:   spouse at beside Disposition Plan:   Home when medically stable       Procedures/Studies: Ct Abdomen Pelvis Wo Contrast  02/13/2014   CLINICAL DATA:  Right flank pain and hematuria. Lower back pain. Bilateral hand and foot swelling for 3 days.  EXAM: CT ABDOMEN AND PELVIS WITHOUT CONTRAST  TECHNIQUE: Multidetector CT imaging  of the abdomen and pelvis was performed following the standard protocol without IV contrast.  COMPARISON:  Pelvic ultrasound performed 12/25/2012  FINDINGS: Trace bilateral pleural effusions are seen, with mild interstitial prominence. This could reflect minimal interstitial edema.  The liver and spleen are unremarkable in appearance. The gallbladder is within normal limits. The pancreas and adrenal glands are unremarkable.  The kidneys are unremarkable in appearance. There is no evidence of hydronephrosis. No renal or ureteral stones are seen. No perinephric stranding is appreciated.  No free fluid is identified. The small bowel is unremarkable in appearance. The stomach is within normal limits. No acute vascular abnormalities are seen.  Mild soft tissue edema is noted along the lateral abdominal wall bilaterally.  The appendix is normal in caliber and contains air, without evidence for appendicitis. The colon is unremarkable in appearance.  The bladder is mildly distended and grossly unremarkable. The uterus is within normal limits. The ovaries are relatively symmetric. No suspicious adnexal masses are seen. A small right ovarian follicle is noted.  Scattered prominent bilateral inguinal nodes are seen, measuring up to 1.5 cm in short axis. These are of uncertain significance.  No acute osseous abnormalities are identified.  IMPRESSION: 1. No acute abnormalities seen to explain the patient's symptoms. 2. Trace bilateral pleural effusions, with mild interstitial prominence. This could reflect minimal pulmonary edema. 3. Mild soft tissue edema along the lateral abdominal wall bilaterally. 4. Scattered prominent bilateral inguinal nodes, measuring up to 1.5 cm in short axis. These are of uncertain significance, though the largest node, on the right side, has a relatively benign appearance.   Electronically Signed   By: Garald Balding M.D.   On: 02/13/2014 04:14   Dg Chest 2 View  03/09/2014   CLINICAL DATA:  Chest  pain and difficulty swallowing.  EXAM: CHEST  2 VIEW  COMPARISON:  None.  FINDINGS: The cardiac silhouette, mediastinal and hilar contours are within normal limits. There are bilateral lower lobe infiltrates and right greater than left pleural effusions. Fairly significant bronchitic changes are also noted. The bony thorax is intact.  IMPRESSION: Extensive bronchitic changes and bibasilar infiltrates and small effusions, right greater than left.   Electronically Signed   By: Kalman Jewels M.D.   On: 03/09/2014 01:50   Dg Wrist 2 Views Right  03/09/2014   CLINICAL DATA:  Pain and swelling.  EXAM: RIGHT WRIST - 2 VIEW  COMPARISON:  None.  FINDINGS: There is no evidence of fracture or dislocation. There is no evidence of arthropathy or other focal bone abnormality. Soft tissues are unremarkable.  IMPRESSION: Negative.   Electronically Signed   By: Misty Stanley M.D.   On: 03/09/2014 14:29   Ct Soft Tissue Neck W Contrast  03/09/2014   CLINICAL DATA:  Neck pain.  EXAM: CT NECK WITH CONTRAST  TECHNIQUE: Multidetector CT imaging of the neck was performed using the standard protocol following the bolus administration of intravenous contrast.  CONTRAST:  75m OMNIPAQUE IOHEXOL 300 MG/ML  SOLN  COMPARISON:  None.  FINDINGS: Low-density expansion of the retropharyngeal space extending from the level of the anterior ring of C1 to C4. There is no peripheral enhancement suggestive of abscess. No cavitation within the lateral retropharyngeal nodes. The adenoid tonsil is enlarged and hyper enhancing, but there is no notable enlargement of the palatine tonsils. Diffuse mild nodal enlargement symmetrically throughout the neck. There is also bilateral axillary lymphadenopathy which is partially visualized. Axillary lymphadenopathy is for a solitary pharyngeal infection.  Prominent size of the bilateral parotid glands, without definite inflammation. There are 2 punctate calcifications within the bilateral glands. No evidence  of mass along the surfaces of the aerodigestive tract. Major cervical vessels are patent. The thyroid gland is unremarkable. There is mild inflammatory mucosal thickening within the bilateral maxillary sinuses.  There is a partially visualized layering right pleural effusion which is at least moderate volume to be visible.  IMPRESSION: 1. Retropharyngeal edema, usually related to complicated pharyngitis/tonsillitis. 2. Diffuse cervical lymphadenopathy which could be related to #1, although the presence of bilateral axillary adenopathy and a moderate right pleural effusion implicates a systemic illness the could be infectious, inflammatory, or lymphoproliferative.   Electronically Signed   By: JJorje GuildM.D.   On: 03/09/2014 02:42   Ct Chest W Contrast  03/09/2014   CLINICAL DATA:  Pleural effusions and adenopathy in the chest  EXAM: CT CHEST WITH CONTRAST  TECHNIQUE: Multidetector CT imaging of the chest was performed during intravenous contrast administration.  CONTRAST:  879mOMNIPAQUE IOHEXOL 300 MG/ML  SOLN  COMPARISON:  Multiple exams, including 03/09/2014 and 02/13/2014  FINDINGS: Bilateral axillary and subpectoral adenopathy, index left axillary lymph node 1.5 cm on image 15 of series 2. Clustered supraclavicular lymph nodes, with a posterior node measuring 0.9 cm on image 6 of series 2.  Right upper paratracheal lymph node 0.7 cm in short axis. Multiple additional mediastinal lymph nodes are present. 9 mm left infrahilar lymph node. Rim calcified of right infrahilar lymph node.  Bilateral internal mammary adenopathy with a right internal mammary noted 0.8 cm on image 29 series 2.  Moderate to large right pleural effusion with a loculation anteriorly. Mild enhancement along the parietal pleura, especially anteriorly on the right near the loculation. Right epicardial lymph node 0.9 cm in short axis, image 41  series 2. Small left pleural effusion. Bilateral passive atelectasis.  Trace pericardial  effusion.  I suspect a right glenohumeral joint effusion. No nodules or edema within the aerated lung.  IMPRESSION: 1. Abnormal increased number of lymph nodes and Mild to moderate abnormal enlargement of lymph nodes in the axillary, subpectoral, internal mammary, infrahilar, mediastinal, and epicardial chains could be due to diffuse reactive adenopathy or lymphatic malignancy. Tissue sampling is likely warranted. 2. Moderate right and small left pleural effusions, with suspected pleural enhancement especially anteriorly on the right where there is loculation of the pleural effusion. This raises suspicion that the right pleural effusion is exudative. 3. Trace pericardial effusion. 4. Suspected right glenohumeral joint effusion.   Electronically Signed   By: Sherryl Barters M.D.   On: 03/09/2014 15:01   Dg Chest Port 1 View  03/10/2014   CLINICAL DATA:  Status post right thoracentesis.  EXAM: PORTABLE CHEST - 1 VIEW  COMPARISON:  CT chest and PA and lateral chest 03/09/2014.  FINDINGS: Right pleural effusion is decreased after thoracentesis. No pneumothorax is identified. Left pleural effusion seen on the prior study appears slightly increased. Bibasilar atelectasis is noted.  IMPRESSION: Negative for pneumothorax after right thoracentesis.  Some increase in a small left pleural effusion.   Electronically Signed   By: Inge Rise M.D.   On: 03/10/2014 12:51         Subjective:  patient feels that her sore throat is somewhat better. Denies any chest pain, shortness breath, nausea, vomiting, diarrhea, abdominal pain, dysuria, hematuria no headache or visual disturbance. She continues to complain of right wrist pain.  Objective: Filed Vitals:   03/09/14 0522 03/09/14 1415 03/09/14 2120 03/10/14 0528  BP: 122/85 102/71 115/75 115/74  Pulse: 111 104 108 102  Temp: 99.4 F (37.4 C) 98.7 F (37.1 C) 100.3 F (37.9 C) 98.9 F (37.2 C)  TempSrc: Oral Oral Oral Oral  Resp: 24 20 20 20   Height:  5' (1.524 m)     Weight: 58.922 kg (129 lb 14.4 oz)   59 kg (130 lb 1.1 oz)  SpO2: 98% 97% 97% 97%    Intake/Output Summary (Last 24 hours) at 03/10/14 1809 Last data filed at 03/10/14 0600  Gross per 24 hour  Intake      0 ml  Output    350 ml  Net   -350 ml   Weight change: 0.078 kg (2.7 oz) Exam:   General:  Pt is alert, follows commands appropriately, not in acute distress  HEENT: No icterus, No thrush,  shotty cervical lymphadenopathy, [place]/AT  Cardiovascular: RRR, S1/S2, no rubs, no gallops  Respiratory: bibasilar crackles, right greater than left. No wheezing. Good air movement.   Abdomen: Soft/+BS, non tender, non distended, no guarding  Extremities: 1+ edema in the bilateral upper and lower extremities. There is synovitis of the right wrist without any rash or crepitance.   Data Reviewed: Basic Metabolic Panel:  Recent Labs Lab 03/09/14 0011 03/09/14 0613 03/10/14 0615  NA 130* 136* 136*  K 4.3 4.0 4.2  CL 100 105 107  CO2 18* 18* 15*  GLUCOSE 87 92 76  BUN 17 15 16   CREATININE 0.65 0.57 0.53  CALCIUM 8.2* 7.8* 8.1*   Liver Function Tests:  Recent Labs Lab 03/09/14 0011 03/09/14 0613 03/10/14 0615 03/10/14 1330  AST 27 19 18   --   ALT 15 12 12   --   ALKPHOS 138* 118* 114  --   BILITOT 0.4 0.3 0.3  --  PROT 8.1 7.4 7.5 7.6  ALBUMIN 3.0* 2.6* 2.5*  --     Recent Labs Lab 03/09/14 0011  LIPASE 33   No results found for this basename: AMMONIA,  in the last 168 hours CBC:  Recent Labs Lab 03/09/14 0011 03/09/14 0613 03/10/14 0615  WBC 4.9 4.4 8.2  NEUTROABS  --  3.0 5.3  HGB 10.8* 10.3* 10.3*  HCT 31.8* 30.3* 30.5*  MCV 79.7 79.3 81.8  PLT 355 365 PLATELET CLUMPS NOTED ON SMEAR, UNABLE TO ESTIMATE   Cardiac Enzymes:  Recent Labs Lab 03/09/14 0613 03/09/14 1657  TROPONINI <0.30 <0.30   BNP: No components found with this basename: POCBNP,  CBG: No results found for this basename: GLUCAP,  in the last 168 hours  Recent  Results (from the past 240 hour(s))  RAPID STREP SCREEN     Status: None   Collection Time    03/08/14 11:20 PM      Result Value Ref Range Status   Streptococcus, Group A Screen (Direct) NEGATIVE  NEGATIVE Final   Comment: (NOTE)     A Rapid Antigen test may result negative if the antigen level in the     sample is below the detection level of this test. The FDA has not     cleared this test as a stand-alone test therefore the rapid antigen     negative result has reflexed to a Group A Strep culture.  CULTURE, GROUP A STREP     Status: None   Collection Time    03/08/14 11:20 PM      Result Value Ref Range Status   Specimen Description THROAT   Final   Special Requests CX ADDED AT 2343 ON [id number]   Final   Culture     Final   Value: NO SUSPICIOUS COLONIES, CONTINUING TO HOLD     Performed at [facility]   Report Status PENDING   Incomplete  CULTURE, BLOOD (ROUTINE X 2)     Status: None   Collection Time    03/09/14  3:25 PM      Result Value Ref Range Status   Specimen Description BLOOD LEFT ANTECUBITAL   Final   Special Requests BOTTLES DRAWN AEROBIC AND ANAEROBIC 10CC   Final   Culture  Setup Time     Final   Value: 03/09/2014 21:23     Performed at [facility]   Culture     Final   Value:        BLOOD CULTURE RECEIVED NO GROWTH TO DATE CULTURE WILL BE HELD FOR 5 DAYS BEFORE ISSUING A FINAL NEGATIVE REPORT     Performed at [facility]   Report Status PENDING   Incomplete  CULTURE, BLOOD (ROUTINE X 2)     Status: None   Collection Time    03/09/14  3:40 PM      Result Value Ref Range Status   Specimen Description BLOOD RIGHT HAND   Final   Special Requests BOTTLES DRAWN AEROBIC AND ANAEROBIC 5CC   Final   Culture  Setup Time     Final   Value: 03/09/2014 21:22     Performed at [facility]   Culture     Final   Value:        BLOOD CULTURE RECEIVED NO GROWTH TO DATE CULTURE WILL BE HELD FOR 5 DAYS BEFORE ISSUING A FINAL NEGATIVE REPORT      Performed at [facility]  Report Status PENDING   Incomplete     Scheduled Meds: . azithromycin  250 mg Oral Daily  . enoxaparin (LOVENOX) injection  40 mg Subcutaneous Q24H  . sodium chloride  3 mL Intravenous Q12H   Continuous Infusions:    Milt Coye, DO  [facility] Pager [phone]  If 7PM-7AM, please contact night-coverage [email or web address] Password [email or web address] 03/10/2014, 6:09 PM   LOS: 1 day

## 2014-03-10 NOTE — Consult Note (Signed)
Reason for Consult: lymph node biopsy, adenopathy  Referring Physician: Dr. Shanon Brow Tat  The patients history was obtained by interpreter Lesle Chris   HPI: Wendy Kaufman is a 28 year old Hispanic speaking female who presented yesterday with a 2 week history of upper neck and arm pain, fatigue and generalized swelling.  She reports developing chills and sweats over the last 2 days. She denies any weight loss, night sweats or fevers prior to 2 days ago.  She denies any medical problems.  She denies a family of cancer including lymphoma.  She denies cough, shortness of breath.  She denies weakness, but reports overall malaise.   Work up shows a normal white count, mild anemia with h&h of 10.3/30.5, normal TSH, negative UPT, positive ANA, negative HIV, negative UA, echocardiogram with stage I DD, negative upper extremity venous dopplers.  A Ct of neck and chest showed retropharyngeal edema due to pharyngitis/tonsillitis, diffuse adenopathy, reactive versus lymphatic malignancy, bilateral pleural effusions.  We have been asked to evaluate the patient for lymph node biopsy for further diagnosis.    Past Medical History  Diagnosis Date  . Medical history non-contributory     History reviewed. No pertinent past surgical history.  History reviewed. No pertinent family history.  Social History:  reports that she has never smoked. She has never used smokeless tobacco. She reports that she does not drink alcohol or use illicit drugs.  Allergies: No Known Allergies  Medications:  Scheduled Meds: . azithromycin  250 mg Oral Daily  . enoxaparin (LOVENOX) injection  40 mg Subcutaneous Q24H  . sodium chloride  3 mL Intravenous Q12H   Continuous Infusions:  PRN Meds:.sodium chloride, acetaminophen, acetaminophen, phenol-menthol, sodium chloride   Results for orders placed during the hospital encounter of 03/09/14 (from the past 48 hour(s))  RAPID STREP SCREEN     Status: None   Collection  Time    03/08/14 11:20 PM      Result Value Ref Range   Streptococcus, Group A Screen (Direct) NEGATIVE  NEGATIVE   Comment: (NOTE)     A Rapid Antigen test may result negative if the antigen level in the     sample is below the detection level of this test. The FDA has not     cleared this test as a stand-alone test therefore the rapid antigen     negative result has reflexed to a Group A Strep culture.  URINALYSIS, ROUTINE W REFLEX MICROSCOPIC     Status: Abnormal   Collection Time    03/08/14 11:20 PM      Result Value Ref Range   Color, Urine YELLOW  YELLOW   APPearance CLOUDY (*) CLEAR   Specific Gravity, Urine 1.015  1.005 - 1.030   pH 5.5  5.0 - 8.0   Glucose, UA NEGATIVE  NEGATIVE mg/dL   Hgb urine dipstick SMALL (*) NEGATIVE   Bilirubin Urine NEGATIVE  NEGATIVE   Ketones, ur NEGATIVE  NEGATIVE mg/dL   Protein, ur 30 (*) NEGATIVE mg/dL   Urobilinogen, UA 0.2  0.0 - 1.0 mg/dL   Nitrite NEGATIVE  NEGATIVE   Leukocytes, UA SMALL (*) NEGATIVE  CULTURE, GROUP A STREP     Status: None   Collection Time    03/08/14 11:20 PM      Result Value Ref Range   Specimen Description THROAT     Special Requests CX ADDED AT 2343 ON 213086     Culture       Value:  NO SUSPICIOUS COLONIES, CONTINUING TO HOLD     Performed at [facility]   Report Status PENDING    URINE MICROSCOPIC-ADD ON     Status: Abnormal   Collection Time    03/08/14 11:20 PM      Result Value Ref Range   Squamous Epithelial / LPF MANY (*) RARE   WBC, UA 3-6  <3 WBC/hpf   RBC / HPF 3-6  <3 RBC/hpf   Bacteria, UA RARE  RARE   Urine-Other MUCOUS PRESENT    CBC     Status: Abnormal   Collection Time    03/09/14 12:11 AM      Result Value Ref Range   WBC 4.9  4.0 - 10.5 K/uL   RBC 3.99  3.87 - 5.11 MIL/uL   Hemoglobin 10.8 (*) 12.0 - 15.0 g/dL   HCT 31.8 (*) 36.0 - 46.0 %   MCV 79.7  78.0 - 100.0 fL   MCH 27.1  26.0 - 34.0 pg   MCHC 34.0  30.0 - 36.0 g/dL   RDW 17.1 (*) 11.5 - 15.5 %   Platelets  355  150 - 400 K/uL  BASIC METABOLIC PANEL     Status: Abnormal   Collection Time    03/09/14 12:11 AM      Result Value Ref Range   Sodium 130 (*) 137 - 147 mEq/L   Potassium 4.3  3.7 - 5.3 mEq/L   Chloride 100  96 - 112 mEq/L   CO2 18 (*) 19 - 32 mEq/L   Glucose, Bld 87  70 - 99 mg/dL   BUN 17  6 - 23 mg/dL   Creatinine, Ser 0.65  0.50 - 1.10 mg/dL   Calcium 8.2 (*) 8.4 - 10.5 mg/dL   GFR calc non Af Amer >90  >90 mL/min   GFR calc Af Amer >90  >90 mL/min   Comment: (NOTE)     The eGFR has been calculated using the CKD EPI equation.     This calculation has not been validated in all clinical situations.     eGFR's persistently <90 mL/min signify possible Chronic Kidney     Disease.   Anion gap 12  5 - 15  PRO B NATRIURETIC PEPTIDE     Status: Abnormal   Collection Time    03/09/14 12:11 AM      Result Value Ref Range   Pro B Natriuretic peptide (BNP) 431.5 (*) 0 - 125 pg/mL  HEPATIC FUNCTION PANEL     Status: Abnormal   Collection Time    03/09/14 12:11 AM      Result Value Ref Range   Total Protein 8.1  6.0 - 8.3 g/dL   Albumin 3.0 (*) 3.5 - 5.2 g/dL   AST 27  0 - 37 U/L   ALT 15  0 - 35 U/L   Alkaline Phosphatase 138 (*) 39 - 117 U/L   Total Bilirubin 0.4  0.3 - 1.2 mg/dL   Bilirubin, Direct <0.2  0.0 - 0.3 mg/dL   Indirect Bilirubin NOT CALCULATED  0.3 - 0.9 mg/dL  LIPASE, BLOOD     Status: None   Collection Time    03/09/14 12:11 AM      Result Value Ref Range   Lipase 33  11 - 59 U/L  Randolm Idol, ED     Status: None   Collection Time    03/09/14 12:21 AM      Result Value Ref Range  Troponin i, poc 0.00  0.00 - 0.08 ng/mL   Comment 3            Comment: Due to the release kinetics of cTnI,     a negative result within the first hours     of the onset of symptoms does not rule out     myocardial infarction with certainty.     If myocardial infarction is still suspected,     repeat the test at appropriate intervals.  PROTEIN / CREATININE RATIO, URINE      Status: Abnormal   Collection Time    03/09/14  2:24 AM      Result Value Ref Range   Creatinine, Urine 24.99     Total Protein, Urine 12.9     Comment: NO NORMAL RANGE ESTABLISHED FOR THIS TEST   PROTEIN CREATININE RATIO 0.52 (*) 0.00 - 0.15  I-STAT CG4 LACTIC ACID, ED     Status: None   Collection Time    03/09/14  2:55 AM      Result Value Ref Range   Lactic Acid, Venous 0.78  0.5 - 2.2 mmol/L  LACTATE DEHYDROGENASE     Status: None   Collection Time    03/09/14  6:13 AM      Result Value Ref Range   LDH 159  94 - 250 U/L  COMPREHENSIVE METABOLIC PANEL     Status: Abnormal   Collection Time    03/09/14  6:13 AM      Result Value Ref Range   Sodium 136 (*) 137 - 147 mEq/L   Potassium 4.0  3.7 - 5.3 mEq/L   Chloride 105  96 - 112 mEq/L   CO2 18 (*) 19 - 32 mEq/L   Glucose, Bld 92  70 - 99 mg/dL   BUN 15  6 - 23 mg/dL   Creatinine, Ser 0.57  0.50 - 1.10 mg/dL   Calcium 7.8 (*) 8.4 - 10.5 mg/dL   Total Protein 7.4  6.0 - 8.3 g/dL   Albumin 2.6 (*) 3.5 - 5.2 g/dL   AST 19  0 - 37 U/L   ALT 12  0 - 35 U/L   Alkaline Phosphatase 118 (*) 39 - 117 U/L   Total Bilirubin 0.3  0.3 - 1.2 mg/dL   GFR calc non Af Amer >90  >90 mL/min   GFR calc Af Amer >90  >90 mL/min   Comment: (NOTE)     The eGFR has been calculated using the CKD EPI equation.     This calculation has not been validated in all clinical situations.     eGFR's persistently <90 mL/min signify possible Chronic Kidney     Disease.   Anion gap 13  5 - 15  CBC WITH DIFFERENTIAL     Status: Abnormal   Collection Time    03/09/14  6:13 AM      Result Value Ref Range   WBC 4.4  4.0 - 10.5 K/uL   RBC 3.82 (*) 3.87 - 5.11 MIL/uL   Hemoglobin 10.3 (*) 12.0 - 15.0 g/dL   HCT 30.3 (*) 36.0 - 46.0 %   MCV 79.3  78.0 - 100.0 fL   MCH 27.0  26.0 - 34.0 pg   MCHC 34.0  30.0 - 36.0 g/dL   RDW 17.1 (*) 11.5 - 15.5 %   Platelets 365  150 - 400 K/uL   Neutrophils Relative % 66  43 - 77 %   Lymphocytes Relative 23  12 -  46 %   Monocytes Relative 9  3 - 12 %   Eosinophils Relative 1  0 - 5 %   Basophils Relative 1  0 - 1 %   Neutro Abs 3.0  1.7 - 7.7 K/uL   Lymphs Abs 1.0  0.7 - 4.0 K/uL   Monocytes Absolute 0.4  0.1 - 1.0 K/uL   Eosinophils Absolute 0.0  0.0 - 0.7 K/uL   Basophils Absolute 0.0  0.0 - 0.1 K/uL   RBC Morphology ELLIPTOCYTES     WBC Morphology INCREASED BANDS (>20% BANDS)    TSH     Status: None   Collection Time    03/09/14  6:13 AM      Result Value Ref Range   TSH 3.220  0.350 - 4.500 uIU/mL  TROPONIN I     Status: None   Collection Time    03/09/14  6:13 AM      Result Value Ref Range   Troponin I <0.30  <0.30 ng/mL   Comment:            Due to the release kinetics of cTnI,     a negative result within the first hours     of the onset of symptoms does not rule out     myocardial infarction with certainty.     If myocardial infarction is still suspected,     repeat the test at appropriate intervals.  HIV ANTIBODY (ROUTINE TESTING)     Status: None   Collection Time    03/09/14  1:30 PM      Result Value Ref Range   HIV 1&2 Ab, 4th Generation NONREACTIVE  NONREACTIVE   Comment: (NOTE)     A NONREACTIVE HIV Ag/Ab result does not exclude HIV infection since     the time frame for seroconversion is variable. If acute HIV infection     is suspected, a HIV-1 RNA Qualitative TMA test is recommended.     HIV-1/2 Antibody Diff         Not indicated.     HIV-1 RNA, Qual TMA           Not indicated.     PLEASE NOTE: This information has been disclosed to you from records     whose confidentiality may be protected by state law. If your state     requires such protection, then the state law prohibits you from making     any further disclosure of the information without the specific written     consent of the person to whom it pertains, or as otherwise permitted     by law. A general authorization for the release of medical or other     information is NOT sufficient for this purpose.      The performance of this assay has not been clinically validated in     patients less than 84 years old.     Performed at [facility]     Status: None   Collection Time    03/09/14  1:30 PM      Result Value Ref Range   Uric Acid, Serum 6.7  2.4 - 7.0 mg/dL  IRON AND TIBC     Status: Abnormal   Collection Time    03/09/14  1:30 PM      Result Value Ref Range   Iron <10 (*) 42 - 135 ug/dL   TIBC Not calculated due to Iron <10.  250 - 470 ug/dL   Saturation Ratios Not calculated due to Iron <10.  20 - 55 %   UIBC 179  125 - 400 ug/dL   Comment: Performed at [facility]     Status: None   Collection Time    03/09/14  1:30 PM      Result Value Ref Range   Ferritin 89  10 - 291 ng/mL   Comment: Performed at [facility]  TROPONIN I     Status: None   Collection Time    03/09/14  4:57 PM      Result Value Ref Range   Troponin I <0.30  <0.30 ng/mL   Comment:            Due to the release kinetics of cTnI,     a negative result within the first hours     of the onset of symptoms does not rule out     myocardial infarction with certainty.     If myocardial infarction is still suspected,     repeat the test at appropriate intervals.  CBC WITH DIFFERENTIAL     Status: Abnormal   Collection Time    03/10/14  6:15 AM      Result Value Ref Range   WBC 8.2  4.0 - 10.5 K/uL   Comment: WHITE COUNT CONFIRMED ON SMEAR   RBC 3.73 (*) 3.87 - 5.11 MIL/uL   Hemoglobin 10.3 (*) 12.0 - 15.0 g/dL   HCT 30.5 (*) 36.0 - 46.0 %   MCV 81.8  78.0 - 100.0 fL   MCH 27.6  26.0 - 34.0 pg   MCHC 33.8  30.0 - 36.0 g/dL   RDW 17.1 (*) 11.5 - 15.5 %   Platelets PLATELET CLUMPS NOTED ON SMEAR, UNABLE TO ESTIMATE  150 - 400 K/uL   Neutrophils Relative % 66  43 - 77 %   Lymphocytes Relative 24  12 - 46 %   Monocytes Relative 8  3 - 12 %   Eosinophils Relative 1  0 - 5 %   Basophils Relative 1  0 - 1 %   Neutro Abs 5.3  1.7 - 7.7 K/uL   Lymphs Abs 2.0   0.7 - 4.0 K/uL   Monocytes Absolute 0.7  0.1 - 1.0 K/uL   Eosinophils Absolute 0.1  0.0 - 0.7 K/uL   Basophils Absolute 0.1  0.0 - 0.1 K/uL   RBC Morphology BURR CELLS     WBC Morphology ATYPICAL LYMPHOCYTES    COMPREHENSIVE METABOLIC PANEL     Status: Abnormal   Collection Time    03/10/14  6:15 AM      Result Value Ref Range   Sodium 136 (*) 137 - 147 mEq/L   Potassium 4.2  3.7 - 5.3 mEq/L   Chloride 107  96 - 112 mEq/L   CO2 15 (*) 19 - 32 mEq/L   Glucose, Bld 76  70 - 99 mg/dL   BUN 16  6 - 23 mg/dL   Creatinine, Ser 0.53  0.50 - 1.10 mg/dL   Calcium 8.1 (*) 8.4 - 10.5 mg/dL   Total Protein 7.5  6.0 - 8.3 g/dL   Albumin 2.5 (*) 3.5 - 5.2 g/dL   AST 18  0 - 37 U/L   ALT 12  0 - 35 U/L   Alkaline Phosphatase 114  39 - 117 U/L   Total Bilirubin 0.3  0.3 - 1.2 mg/dL   GFR calc  non Af Amer >90  >90 mL/min   GFR calc Af Amer >90  >90 mL/min   Comment: (NOTE)     The eGFR has been calculated using the CKD EPI equation.     This calculation has not been validated in all clinical situations.     eGFR's persistently <90 mL/min signify possible Chronic Kidney     Disease.   Anion gap 14  5 - 15    Dg Chest 2 View  03/09/2014   CLINICAL DATA:  Chest pain and difficulty swallowing.  EXAM: CHEST  2 VIEW  COMPARISON:  None.  FINDINGS: The cardiac silhouette, mediastinal and hilar contours are within normal limits. There are bilateral lower lobe infiltrates and right greater than left pleural effusions. Fairly significant bronchitic changes are also noted. The bony thorax is intact.  IMPRESSION: Extensive bronchitic changes and bibasilar infiltrates and small effusions, right greater than left.   Electronically Signed   By: Kalman Jewels M.D.   On: 03/09/2014 01:50   Dg Wrist 2 Views Right  03/09/2014   CLINICAL DATA:  Pain and swelling.  EXAM: RIGHT WRIST - 2 VIEW  COMPARISON:  None.  FINDINGS: There is no evidence of fracture or dislocation. There is no evidence of arthropathy or other  focal bone abnormality. Soft tissues are unremarkable.  IMPRESSION: Negative.   Electronically Signed   By: Misty Stanley M.D.   On: 03/09/2014 14:29   Ct Soft Tissue Neck W Contrast  03/09/2014   CLINICAL DATA:  Neck pain.  EXAM: CT NECK WITH CONTRAST  TECHNIQUE: Multidetector CT imaging of the neck was performed using the standard protocol following the bolus administration of intravenous contrast.  CONTRAST:  74m OMNIPAQUE IOHEXOL 300 MG/ML  SOLN  COMPARISON:  None.  FINDINGS: Low-density expansion of the retropharyngeal space extending from the level of the anterior ring of C1 to C4. There is no peripheral enhancement suggestive of abscess. No cavitation within the lateral retropharyngeal nodes. The adenoid tonsil is enlarged and hyper enhancing, but there is no notable enlargement of the palatine tonsils. Diffuse mild nodal enlargement symmetrically throughout the neck. There is also bilateral axillary lymphadenopathy which is partially visualized. Axillary lymphadenopathy is for a solitary pharyngeal infection.  Prominent size of the bilateral parotid glands, without definite inflammation. There are 2 punctate calcifications within the bilateral glands. No evidence of mass along the surfaces of the aerodigestive tract. Major cervical vessels are patent. The thyroid gland is unremarkable. There is mild inflammatory mucosal thickening within the bilateral maxillary sinuses.  There is a partially visualized layering right pleural effusion which is at least moderate volume to be visible.  IMPRESSION: 1. Retropharyngeal edema, usually related to complicated pharyngitis/tonsillitis. 2. Diffuse cervical lymphadenopathy which could be related to #1, although the presence of bilateral axillary adenopathy and a moderate right pleural effusion implicates a systemic illness the could be infectious, inflammatory, or lymphoproliferative.   Electronically Signed   By: JJorje GuildM.D.   On: 03/09/2014 02:42   Ct  Chest W Contrast  03/09/2014   CLINICAL DATA:  Pleural effusions and adenopathy in the chest  EXAM: CT CHEST WITH CONTRAST  TECHNIQUE: Multidetector CT imaging of the chest was performed during intravenous contrast administration.  CONTRAST:  816mOMNIPAQUE IOHEXOL 300 MG/ML  SOLN  COMPARISON:  Multiple exams, including 03/09/2014 and 02/13/2014  FINDINGS: Bilateral axillary and subpectoral adenopathy, index left axillary lymph node 1.5 cm on image 15 of series 2. Clustered supraclavicular lymph nodes, with a  posterior node measuring 0.9 cm on image 6 of series 2.  Right upper paratracheal lymph node 0.7 cm in short axis. Multiple additional mediastinal lymph nodes are present. 9 mm left infrahilar lymph node. Rim calcified of right infrahilar lymph node.  Bilateral internal mammary adenopathy with a right internal mammary noted 0.8 cm on image 29 series 2.  Moderate to large right pleural effusion with a loculation anteriorly. Mild enhancement along the parietal pleura, especially anteriorly on the right near the loculation. Right epicardial lymph node 0.9 cm in short axis, image 41 series 2. Small left pleural effusion. Bilateral passive atelectasis.  Trace pericardial effusion.  I suspect a right glenohumeral joint effusion. No nodules or edema within the aerated lung.  IMPRESSION: 1. Abnormal increased number of lymph nodes and Mild to moderate abnormal enlargement of lymph nodes in the axillary, subpectoral, internal mammary, infrahilar, mediastinal, and epicardial chains could be due to diffuse reactive adenopathy or lymphatic malignancy. Tissue sampling is likely warranted. 2. Moderate right and small left pleural effusions, with suspected pleural enhancement especially anteriorly on the right where there is loculation of the pleural effusion. This raises suspicion that the right pleural effusion is exudative. 3. Trace pericardial effusion. 4. Suspected right glenohumeral joint effusion.   Electronically  Signed   By: Sherryl Barters M.D.   On: 03/09/2014 15:01    Review of Systems  Constitutional: Positive for chills and malaise/fatigue. Negative for fever, weight loss and diaphoresis.  Eyes: Negative.   Respiratory: Negative.   Cardiovascular: Positive for leg swelling. Negative for chest pain, palpitations, orthopnea and PND.  Gastrointestinal: Negative.   Genitourinary: Negative.   Musculoskeletal: Positive for joint pain and neck pain. Negative for myalgias.  Neurological: Negative.  Negative for weakness and headaches.  Psychiatric/Behavioral: Negative.    Blood pressure 115/74, pulse 102, temperature 98.9 F (37.2 C), temperature source Oral, resp. rate 20, height 5' (1.524 m), weight 130 lb 1.1 oz (59 kg), SpO2 97.00%, unknown if currently breastfeeding. Physical Exam  Constitutional: She is oriented to person, place, and time. She appears well-developed and well-nourished. No distress.  HENT:  Head: Normocephalic and atraumatic.  Mouth/Throat: No oropharyngeal exudate.  Neck: Normal range of motion. Neck supple.  Cardiovascular: Normal rate, normal heart sounds and intact distal pulses.  Exam reveals no gallop and no friction rub.   No murmur heard. Respiratory: Effort normal and breath sounds normal. No respiratory distress. She has no wheezes. She has no rales. She exhibits no tenderness.  GI: Soft. Bowel sounds are normal. She exhibits no distension and no mass. There is no tenderness. There is no rebound and no guarding.  Musculoskeletal:  Non pitting edema that is severe to both hands and moderate to BLE equally.   Lymphadenopathy:       Head (right side): Preauricular and posterior auricular adenopathy present.       Head (left side): Preauricular and posterior auricular adenopathy present.    She has cervical adenopathy.       Right cervical: Posterior cervical adenopathy present.       Left cervical: Posterior cervical adenopathy present.    She has axillary  adenopathy.       Right axillary: Lateral adenopathy present.       Left axillary: Lateral adenopathy present.       Right: Inguinal adenopathy present.  Neurological: She is alert and oriented to person, place, and time.  Skin: Skin is warm and dry. She is not diaphoretic.  Psychiatric: She has  a normal mood and affect. Her behavior is normal. Judgment and thought content normal.    Assessment/Plan: Diffuse edema/pain Malaise IDA Bilateral pleural effusions Diffuse lymphadenopathy   Reactive versus malignancy.  Pulmonary planning for a thoracentesis of effusion.  I will discuss with Dr. Georgette Dover whether we are going to pursue a excisional biopsy, although it may be more beneficial to await thoracentesis cytology.  Further recommendations to follow.  Thank you for the consult.   Abbagale Goguen  ANP-BC Pager [phone])  03/10/2014, 10:33 AM

## 2014-03-11 ENCOUNTER — Inpatient Hospital Stay (HOSPITAL_COMMUNITY): Payer: Medicaid Other | Admitting: Anesthesiology

## 2014-03-11 ENCOUNTER — Encounter (HOSPITAL_COMMUNITY)
Admission: EM | Disposition: A | Discharge status: Home / Self Care | Payer: Self-pay | Source: Home / Self Care | Attending: Internal Medicine

## 2014-03-11 DIAGNOSIS — J90 Pleural effusion, not elsewhere classified: Secondary | ICD-10-CM

## 2014-03-11 DIAGNOSIS — R599 Enlarged lymph nodes, unspecified: Secondary | ICD-10-CM

## 2014-03-11 DIAGNOSIS — R894 Abnormal immunological findings in specimens from other organs, systems and tissues: Secondary | ICD-10-CM

## 2014-03-11 DIAGNOSIS — R6510 Systemic inflammatory response syndrome (SIRS) of non-infectious origin without acute organ dysfunction: Secondary | ICD-10-CM

## 2014-03-11 HISTORY — PX: AXILLARY LYMPH NODE BIOPSY: SHX5737

## 2014-03-11 LAB — HIV-1 RNA QUANT-NO REFLEX-BLD
HIV 1 RNA Quant: <20 copies/mL (ref <20)
HIV-1 RNA Quant, Log: <1.3 {Log} (ref <1.30)

## 2014-03-11 LAB — BLOOD GAS, ARTERIAL
ACID-BASE DEFICIT: 6.4 mmol/L — AB (ref 0.0–2.0)
Bicarbonate: 17.5 mEq/L — ABNORMAL LOW (ref 20.0–24.0)
DRAWN BY: 24513
O2 CONTENT: 2 L/min
O2 Saturation: 95 %
PO2 ART: 78.5 mmHg — AB (ref 80.0–100.0)
Patient temperature: 99.9
TCO2: 18.4 mmol/L (ref 0–100)
pCO2 arterial: 30.2 mmHg — ABNORMAL LOW (ref 35.0–45.0)
pH, Arterial: 7.387 (ref 7.350–7.450)

## 2014-03-11 LAB — C-REACTIVE PROTEIN: CRP: 6.7 mg/dL — ABNORMAL HIGH (ref <0.60)

## 2014-03-11 LAB — SEDIMENTATION RATE: Sed Rate: 52 mm/hr — ABNORMAL HIGH (ref 0–22)

## 2014-03-11 LAB — RHEUMATOID FACTOR: Rhuematoid fact SerPl-aCnc: <10 IU/mL (ref <=14)

## 2014-03-11 LAB — MRSA PCR SCREENING: MRSA BY PCR: NEGATIVE

## 2014-03-11 SURGERY — AXILLARY LYMPH NODE BIOPSY
Anesthesia: General | Site: Axilla | Laterality: Right

## 2014-03-11 MED ORDER — OXYCODONE HCL 5 MG PO TABS
5.0000 mg | ORAL_TABLET | Freq: Once | ORAL | Status: AC | PRN
  Administered 2014-03-11: 5 mg via ORAL

## 2014-03-11 MED ORDER — FENTANYL CITRATE 0.05 MG/ML IJ SOLN
INTRAMUSCULAR | Status: AC
  Filled 2014-03-11: qty 5

## 2014-03-11 MED ORDER — PROMETHAZINE HCL 25 MG/ML IJ SOLN: 6.2500 mg | INTRAMUSCULAR | Status: DC | PRN

## 2014-03-11 MED ORDER — LACTATED RINGERS IV SOLN
INTRAVENOUS | Status: DC
  Administered 2014-03-11: 13:00:00 via INTRAVENOUS

## 2014-03-11 MED ORDER — BUPIVACAINE-EPINEPHRINE (PF) 0.25% -1:200000 IJ SOLN
INTRAMUSCULAR | Status: AC
  Filled 2014-03-11: qty 30

## 2014-03-11 MED ORDER — PROPOFOL 10 MG/ML IV BOLUS
INTRAVENOUS | Status: AC
  Filled 2014-03-11: qty 20

## 2014-03-11 MED ORDER — LIDOCAINE HCL (CARDIAC) 20 MG/ML IV SOLN
INTRAVENOUS | Status: AC
  Filled 2014-03-11: qty 5

## 2014-03-11 MED ORDER — HYDROMORPHONE HCL PF 1 MG/ML IJ SOLN
INTRAMUSCULAR | Status: AC
  Filled 2014-03-11: qty 1

## 2014-03-11 MED ORDER — SODIUM CHLORIDE 0.9 % IV BOLUS (SEPSIS)
1000.0000 mL | Freq: Once | INTRAVENOUS | Status: AC
  Administered 2014-03-11: 1000 mL via INTRAVENOUS

## 2014-03-11 MED ORDER — MIDAZOLAM HCL 2 MG/2ML IJ SOLN
INTRAMUSCULAR | Status: AC
  Filled 2014-03-11: qty 2

## 2014-03-11 MED ORDER — DEXTROSE-NACL 5-0.9 % IV SOLN
INTRAVENOUS | Status: DC
  Administered 2014-03-11 (×2): 1000 mL via INTRAVENOUS
  Administered 2014-03-12: 01:00:00 via INTRAVENOUS

## 2014-03-11 MED ORDER — LIDOCAINE HCL (CARDIAC) 20 MG/ML IV SOLN
INTRAVENOUS | Status: DC | PRN
  Administered 2014-03-11: 40 mg via INTRAVENOUS

## 2014-03-11 MED ORDER — FENTANYL CITRATE 0.05 MG/ML IJ SOLN
INTRAMUSCULAR | Status: DC | PRN
  Administered 2014-03-11 (×2): 50 ug via INTRAVENOUS

## 2014-03-11 MED ORDER — HYDROMORPHONE HCL PF 1 MG/ML IJ SOLN
0.2500 mg | INTRAMUSCULAR | Status: DC | PRN
  Administered 2014-03-11 (×2): 0.5 mg via INTRAVENOUS

## 2014-03-11 MED ORDER — ENOXAPARIN SODIUM 40 MG/0.4ML ~~LOC~~ SOLN
40.0000 mg | SUBCUTANEOUS | Status: DC
  Administered 2014-03-11 – 2014-03-14 (×4): 40 mg via SUBCUTANEOUS
  Filled 2014-03-11 (×5): qty 0.4

## 2014-03-11 MED ORDER — LACTATED RINGERS IV SOLN
INTRAVENOUS | Status: DC | PRN
  Administered 2014-03-11: 14:00:00 via INTRAVENOUS

## 2014-03-11 MED ORDER — PROPOFOL 10 MG/ML IV BOLUS
INTRAVENOUS | Status: DC | PRN
  Administered 2014-03-11: 100 mg via INTRAVENOUS

## 2014-03-11 MED ORDER — MIDAZOLAM HCL 2 MG/2ML IJ SOLN
INTRAMUSCULAR | Status: DC | PRN
  Administered 2014-03-11: 1 mg via INTRAVENOUS

## 2014-03-11 MED ORDER — BUPIVACAINE-EPINEPHRINE 0.25% -1:200000 IJ SOLN
INTRAMUSCULAR | Status: DC | PRN
  Administered 2014-03-11: 30 mL

## 2014-03-11 MED ORDER — OXYCODONE HCL 5 MG PO TABS
ORAL_TABLET | ORAL | Status: AC
  Filled 2014-03-11: qty 1

## 2014-03-11 MED ORDER — SUCCINYLCHOLINE CHLORIDE 20 MG/ML IJ SOLN
INTRAMUSCULAR | Status: AC
  Filled 2014-03-11: qty 1

## 2014-03-11 MED ORDER — CEFAZOLIN SODIUM-DEXTROSE 2-3 GM-% IV SOLR
2.0000 g | INTRAVENOUS | Status: AC
  Filled 2014-03-11 (×2): qty 50

## 2014-03-11 MED ORDER — OXYCODONE HCL 5 MG/5ML PO SOLN: 5.0000 mg | Freq: Once | ORAL | Status: AC | PRN

## 2014-03-11 MED ORDER — ONDANSETRON HCL 4 MG/2ML IJ SOLN
INTRAMUSCULAR | Status: AC
  Filled 2014-03-11: qty 2

## 2014-03-11 MED ORDER — 0.9 % SODIUM CHLORIDE (POUR BTL) OPTIME
TOPICAL | Status: DC | PRN
  Administered 2014-03-11: 1000 mL

## 2014-03-11 MED ORDER — ONDANSETRON HCL 4 MG/2ML IJ SOLN
INTRAMUSCULAR | Status: DC | PRN
  Administered 2014-03-11: 4 mg via INTRAVENOUS

## 2014-03-11 MED ORDER — MORPHINE SULFATE 2 MG/ML IJ SOLN
2.0000 mg | INTRAMUSCULAR | Status: DC | PRN
  Administered 2014-03-11 – 2014-03-13 (×3): 2 mg via INTRAVENOUS
  Administered 2014-03-14 – 2014-03-15 (×5): 4 mg via INTRAVENOUS
  Administered 2014-03-15: 2 mg via INTRAVENOUS
  Administered 2014-03-15: 4 mg via INTRAVENOUS
  Filled 2014-03-11: qty 1
  Filled 2014-03-11 (×3): qty 2
  Filled 2014-03-11: qty 1
  Filled 2014-03-11: qty 2
  Filled 2014-03-11: qty 1
  Filled 2014-03-11: qty 2
  Filled 2014-03-11: qty 1
  Filled 2014-03-11: qty 2

## 2014-03-11 SURGICAL SUPPLY — 46 items
APPLIER CLIP 9.375 MED OPEN (MISCELLANEOUS)
BENZOIN TINCTURE PRP APPL 2/3 (GAUZE/BANDAGES/DRESSINGS) ×2 IMPLANT
BLADE SURG 10 STRL SS (BLADE) ×2 IMPLANT
BLADE SURG 15 STRL LF DISP TIS (BLADE) ×1 IMPLANT
BLADE SURG 15 STRL SS (BLADE) ×1
BLADE SURG ROTATE 9660 (MISCELLANEOUS) IMPLANT
CANISTER SUCTION 2500CC (MISCELLANEOUS) IMPLANT
CHLORAPREP W/TINT 26ML (MISCELLANEOUS) ×2 IMPLANT
CLIP APPLIE 9.375 MED OPEN (MISCELLANEOUS) IMPLANT
CONT SPEC 4OZ CLIKSEAL STRL BL (MISCELLANEOUS) ×2 IMPLANT
COVER PROBE W GEL 5X96 (DRAPES) IMPLANT
COVER SURGICAL LIGHT HANDLE (MISCELLANEOUS) ×2 IMPLANT
DECANTER SPIKE VIAL GLASS SM (MISCELLANEOUS) IMPLANT
DRAPE CHEST BREAST 15X10 FENES (DRAPES) ×2 IMPLANT
DRAPE PED LAPAROTOMY (DRAPES) ×2 IMPLANT
DRAPE UTILITY 15X26 W/TAPE STR (DRAPE) ×4 IMPLANT
DRSG TEGADERM 2-3/8X2-3/4 SM (GAUZE/BANDAGES/DRESSINGS) ×2 IMPLANT
DRSG TEGADERM 4X4.75 (GAUZE/BANDAGES/DRESSINGS) IMPLANT
ELECT CAUTERY BLADE 6.4 (BLADE) ×2 IMPLANT
ELECT REM PT RETURN 9FT ADLT (ELECTROSURGICAL) ×2
ELECTRODE REM PT RTRN 9FT ADLT (ELECTROSURGICAL) ×1 IMPLANT
GAUZE SPONGE 2X2 8PLY STRL LF (GAUZE/BANDAGES/DRESSINGS) ×1 IMPLANT
GAUZE SPONGE 4X4 16PLY XRAY LF (GAUZE/BANDAGES/DRESSINGS) ×2 IMPLANT
GLOVE BIO SURGEON STRL SZ7 (GLOVE) ×2 IMPLANT
GLOVE BIOGEL PI IND STRL 7.5 (GLOVE) ×1 IMPLANT
GLOVE BIOGEL PI INDICATOR 7.5 (GLOVE) ×1
GOWN STRL REUS W/ TWL LRG LVL3 (GOWN DISPOSABLE) ×2 IMPLANT
GOWN STRL REUS W/TWL LRG LVL3 (GOWN DISPOSABLE) ×2
KIT BASIN OR (CUSTOM PROCEDURE TRAY) ×2 IMPLANT
KIT ROOM TURNOVER OR (KITS) ×2 IMPLANT
NEEDLE 18GX1X1/2 (RX/OR ONLY) (NEEDLE) ×2 IMPLANT
NEEDLE HYPO 25GX1X1/2 BEV (NEEDLE) ×2 IMPLANT
NS IRRIG 1000ML POUR BTL (IV SOLUTION) ×2 IMPLANT
PACK SURGICAL SETUP 50X90 (CUSTOM PROCEDURE TRAY) ×2 IMPLANT
PAD ARMBOARD 7.5X6 YLW CONV (MISCELLANEOUS) ×2 IMPLANT
PENCIL BUTTON HOLSTER BLD 10FT (ELECTRODE) ×2 IMPLANT
SPONGE GAUZE 2X2 STER 10/PKG (GAUZE/BANDAGES/DRESSINGS) ×1
STRIP CLOSURE SKIN 1/2X4 (GAUZE/BANDAGES/DRESSINGS) ×2 IMPLANT
SUT MNCRL AB 4-0 PS2 18 (SUTURE) ×2 IMPLANT
SUT VIC AB 3-0 SH 27 (SUTURE) ×1
SUT VIC AB 3-0 SH 27X BRD (SUTURE) ×1 IMPLANT
SYR CONTROL 10ML LL (SYRINGE) ×2 IMPLANT
TOWEL OR 17X24 6PK STRL BLUE (TOWEL DISPOSABLE) ×2 IMPLANT
TOWEL OR 17X26 10 PK STRL BLUE (TOWEL DISPOSABLE) ×2 IMPLANT
TUBE CONNECTING 12X1/4 (SUCTIONS) IMPLANT
YANKAUER SUCT BULB TIP NO VENT (SUCTIONS) IMPLANT

## 2014-03-11 NOTE — Progress Notes (Signed)
Name: Wendy Kaufman MRN: [id number] DOB: [birth year]    ADMISSION DATE:  03/09/2014 CONSULTATION DATE:  03/10/14  REFERRING MD :  TAT  CHIEF COMPLAINT: Short of breath  BRIEF PATIENT DESCRIPTION:  28 yo [place] from [place] presented with 2 weeks for arm/neck pain with upper/lower extremity edema.  Found to have diffuse LAN and Rt pleural effusion on CT chest.  PCCM asked to assess.  STUDIES: 7/26 CT abd/pelvis >> scattered b/l inguinal LAN up to 1.5 cm 8/19 CT neck >> expansion of retropharyngeal space, enlarged adenoid tonsils, diffuse LAN in neck, b/l axillary LAN 8/19 CT chest >> b/l axillary and subpectoral LAN, borderline mediastinal LAN, mod/large Rt pleural effusion 8/19 Doppler upper extremities >> no DVT 8/19 Echo >> EF 60 to 65%, grade 1 diastolic dysfx 8/19 Labs >> ANA 1:2560, HIV negative 8/20 Monoscreen negative 8/20 Rt pleural fluid >> 700 ml fluid, 1550 WBC (47% L), protein 5, LDH 942  SIGNIFICANT EVENTS:  8/19 Admit 8/20 Rt thoracentesis  CULTURES: Blood 8/19 >> Rt pleural fluid 8/20 >> Rt pleural fluid AFB 8/20 >>  ANTIBIOTICS: Zithromax 8/19 >>   SUBJECTIVE:  Denies chest pain, dyspnea, cough.  VITAL SIGNS: Temp:  [99.3 F (37.4 C)-100.4 F (38 C)] 100.4 F (38 C) (08/21 0511) Pulse Rate:  [114-124] 124 (08/21 0511) Resp:  [18] 18 (08/21 0511) BP: (114-129)/(79-86) 114/86 mmHg (08/21 0511) SpO2:  [91 %-97 %] 95 % (08/21 0511) Weight:  [126 lb (57.153 kg)] 126 lb (57.153 kg) (08/21 0500)  PHYSICAL EXAMINATION: General:  Awake, alert no focal defects Neuro:  No focal defects HEENT:  [place], no JVD  Cardiovascular:  rrr Lungs:  Decreased on right , Band-Aid rt thora site Abdomen:  Soft, non-tender  Musculoskeletal:  Intact  Skin:  Upper and LE edema   CBC Recent Labs     03/09/14  0011  03/09/14  0613  03/10/14  0615  WBC  4.9  4.4  8.2  HGB  10.8*  10.3*  10.3*  HCT  31.8*  30.3*  30.5*  PLT  355  365  PLATELET CLUMPS NOTED  ON SMEAR, UNABLE TO ESTIMATE    BMET Recent Labs     03/09/14  0011  03/09/14  0613  03/10/14  0615  NA  130*  136*  136*  K  4.3  4.0  4.2  CL  100  105  107  CO2  18*  18*  15*  BUN  17  15  16   CREATININE  0.65  0.57  0.53  GLUCOSE  87  92  76    Electrolytes Recent Labs     03/09/14  0011  03/09/14  0613  03/10/14  0615  CALCIUM  8.2*  7.8*  8.1*   Liver Enzymes Recent Labs     03/09/14  0011  03/09/14  0613  03/10/14  0615  AST  27  19  18   ALT  15  12  12   ALKPHOS  138*  118*  114  BILITOT  0.4  0.3  0.3  ALBUMIN  3.0*  2.6*  2.5*    Cardiac Enzymes Recent Labs     03/09/14  0011  03/09/14  0613  03/09/14  1657  TROPONINI   --   <0.30  <0.30  PROBNP  431.5*   --    --     Imaging Dg Wrist 2 Views Right  03/09/2014   CLINICAL DATA:  Pain and swelling.  EXAM: RIGHT WRIST - 2 VIEW  COMPARISON:  None.  FINDINGS: There is no evidence of fracture or dislocation. There is no evidence of arthropathy or other focal bone abnormality. Soft tissues are unremarkable.  IMPRESSION: Negative.   Electronically Signed   By: Kennith Center M.D.   On: 03/09/2014 14:29   Ct Chest W Contrast  03/09/2014   CLINICAL DATA:  Pleural effusions and adenopathy in the chest  EXAM: CT CHEST WITH CONTRAST  TECHNIQUE: Multidetector CT imaging of the chest was performed during intravenous contrast administration.  CONTRAST:  80mL OMNIPAQUE IOHEXOL 300 MG/ML  SOLN  COMPARISON:  Multiple exams, including 03/09/2014 and 02/13/2014  FINDINGS: Bilateral axillary and subpectoral adenopathy, index left axillary lymph node 1.5 cm on image 15 of series 2. Clustered supraclavicular lymph nodes, with a posterior node measuring 0.9 cm on image 6 of series 2.  Right upper paratracheal lymph node 0.7 cm in short axis. Multiple additional mediastinal lymph nodes are present. 9 mm left infrahilar lymph node. Rim calcified of right infrahilar lymph node.  Bilateral internal mammary adenopathy with a right  internal mammary noted 0.8 cm on image 29 series 2.  Moderate to large right pleural effusion with a loculation anteriorly. Mild enhancement along the parietal pleura, especially anteriorly on the right near the loculation. Right epicardial lymph node 0.9 cm in short axis, image 41 series 2. Small left pleural effusion. Bilateral passive atelectasis.  Trace pericardial effusion.  I suspect a right glenohumeral joint effusion. No nodules or edema within the aerated lung.  IMPRESSION: 1. Abnormal increased number of lymph nodes and Mild to moderate abnormal enlargement of lymph nodes in the axillary, subpectoral, internal mammary, infrahilar, mediastinal, and epicardial chains could be due to diffuse reactive adenopathy or lymphatic malignancy. Tissue sampling is likely warranted. 2. Moderate right and small left pleural effusions, with suspected pleural enhancement especially anteriorly on the right where there is loculation of the pleural effusion. This raises suspicion that the right pleural effusion is exudative. 3. Trace pericardial effusion. 4. Suspected right glenohumeral joint effusion.   Electronically Signed   By: Herbie Baltimore M.D.   On: 03/09/2014 15:01   Dg Chest Port 1 View  03/10/2014   CLINICAL DATA:  Status post right thoracentesis.  EXAM: PORTABLE CHEST - 1 VIEW  COMPARISON:  CT chest and PA and lateral chest 03/09/2014.  FINDINGS: Right pleural effusion is decreased after thoracentesis. No pneumothorax is identified. Left pleural effusion seen on the prior study appears slightly increased. Bibasilar atelectasis is noted.  IMPRESSION: Negative for pneumothorax after right thoracentesis.  Some increase in a small left pleural effusion.   Electronically Signed   By: Drusilla Kanner M.D.   On: 03/10/2014 12:51    Impression:  28 yo female with diffuse LAN and Rt exudate pleural effusion with positive ANA.  Positive ANA with diffuse LAN. Plan: F/u serology labs from 8/21 Surgery  consulted to assess for Lymph node bx 8/21 Will likely need further rheumatology evaluation unless LN bx shows malignancy  Rt exudate pleural effusion. Plan: F/u pleural fluid cytology, LE cells, cultures from 8/20 F/u quantiferon gold from 8/20   [facility] ACNP Adolph Pollack PCCM Pager [phone] till 3 pm If no answer page [phone] 03/11/2014, 9:25 AM  Pulmonary plan in place.  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  PCCM will sign off.  Please call if further assistance is needed in assessing pleural fluid results.  Epiphany Seltzer  Craige CottaSood, MD [facility] Pulmonary/Critical Care 03/11/2014, 10:13 AM Pager:  [phone] After 3pm call: [phone]

## 2014-03-11 NOTE — Progress Notes (Signed)
Patient ID: Wendy Kaufman, female   DOB: 04/07/1986, 28 y.o.   MRN: [id number]     [address] [place], [place] [address]    Phone: [phone] FAX: [phone]     Subjective: Pt resting comfortably.    Objective:  Vital signs:  Filed Vitals:   03/10/14 2225 03/10/14 2354 03/11/14 0500 03/11/14 0511  BP: 129/81   114/86  Pulse: 115   124  Temp: 100.4 F (38 C) 100 F (37.8 C)  100.4 F (38 C)  TempSrc: Oral Oral  Oral  Resp: 18   18  Height:      Weight:   126 lb (57.153 kg)   SpO2: 91%   95%    Last BM Date: 03/10/14  Intake/Output   Yesterday:    This shift:    I/O last 3 completed shifts: In: -  Out: 350 [Urine:350]    Physical Exam: General: Pt awake/alert/oriented x4 in no acute distress Lymph: bilateral axillary adenopathy.   Chest: cta.  No chest wall pain w good excursion CV:  Pulses intact.  Regular rhythm.  Tachycardic    Problem List:   Active Problems:   Edema   Tonsillitis   Anemia   SIRS (systemic inflammatory response syndrome)   Lymphadenopathy   Pleural effusion on right   Anemia, iron deficiency    Results:   Labs: Results for orders placed during the hospital encounter of 03/09/14 (from the past 48 hour(s))  HIV ANTIBODY (ROUTINE TESTING)     Status: None   Collection Time    03/09/14  1:30 PM      Result Value Ref Range   HIV 1&2 Ab, 4th Generation NONREACTIVE  NONREACTIVE   Comment: (NOTE)     A NONREACTIVE HIV Ag/Ab result does not exclude HIV infection since     the time frame for seroconversion is variable. If acute HIV infection     is suspected, a HIV-1 RNA Qualitative TMA test is recommended.     HIV-1/2 Antibody Diff         Not indicated.     HIV-1 RNA, Qual TMA           Not indicated.     PLEASE NOTE: This information has been disclosed to you from records     whose confidentiality may be protected by state law. If your state   requires such protection, then the state law prohibits you from making     any further disclosure of the information without the specific written     consent of the person to whom it pertains, or as otherwise permitted     by law. A general authorization for the release of medical or other     information is NOT sufficient for this purpose.     The performance of this assay has not been clinically validated in     patients less than 92 years old.     Performed at [facility]  HIV 1 RNA QUANT-NO REFLEX-BLD     Status: None   Collection Time    03/09/14  1:30 PM      Result Value Ref Range   HIV 1 RNA Quant <20  <20 copies/mL   Comment: HIV 1 RNA not detected.   HIV1 RNA Quant, Log <1.30  <1.30 log 10   Comment: (NOTE)     This test utilizes the [place] FDA  approved Roche HIV-1 Test Kit by RT-PCR.     Performed at [facility]     Status: None   Collection Time    03/09/14  1:30 PM      Result Value Ref Range   Uric Acid, Serum 6.7  2.4 - 7.0 mg/dL  ANA     Status: Abnormal   Collection Time    03/09/14  1:30 PM      Result Value Ref Range   ANA POSITIVE (*) NEGATIVE   Comment: Performed at [facility]     Status: Abnormal   Collection Time    03/09/14  1:30 PM      Result Value Ref Range   Iron <10 (*) 42 - 135 ug/dL   TIBC Not calculated due to Iron <10.  250 - 470 ug/dL   Saturation Ratios Not calculated due to Iron <10.  20 - 55 %   UIBC 179  125 - 400 ug/dL   Comment: Performed at [facility]     Status: None   Collection Time    03/09/14  1:30 PM      Result Value Ref Range   Ferritin 89  10 - 291 ng/mL   Comment: Performed at [facility] AB-TITER (ANA TITER)     Status: None   Collection Time    03/09/14  1:30 PM      Result Value Ref Range   ANA Titer 1 1:2560  <1:40   Comment: (NOTE)     Reference Ranges:     1:40 - 1:80 Weakly positive, usually not clinically significant.      > or = to 1:160 Result may be clinically significant.                                                                             ANA Pattern 1 HOMOGENOUS     Comment: Performed at [place], BLOOD (ROUTINE X 2)     Status: None   Collection Time    03/09/14  3:25 PM      Result Value Ref Range   Specimen Description BLOOD LEFT ANTECUBITAL     Special Requests BOTTLES DRAWN AEROBIC AND ANAEROBIC 10CC     Culture  Setup Time       Value: 03/09/2014 21:23     Performed at [facility]   Culture       Value:        BLOOD CULTURE RECEIVED NO GROWTH TO DATE CULTURE WILL BE HELD FOR 5 DAYS BEFORE ISSUING A FINAL NEGATIVE REPORT     Performed at [facility]   Report Status PENDING    CULTURE, BLOOD (ROUTINE X 2)     Status: None   Collection Time    03/09/14  3:40 PM      Result Value Ref Range   Specimen Description BLOOD RIGHT HAND     Special Requests BOTTLES DRAWN AEROBIC AND ANAEROBIC 5CC     Culture  Setup Time       Value: 03/09/2014 21:22     Performed at [facility]   Culture       Value:        BLOOD CULTURE RECEIVED NO GROWTH TO DATE CULTURE WILL BE HELD FOR 5 DAYS BEFORE ISSUING A FINAL NEGATIVE REPORT     Performed at [facility]   Report Status PENDING    TROPONIN I     Status: None   Collection Time    03/09/14  4:57 PM      Result Value Ref Range   Troponin I <0.30  <0.30 ng/mL   Comment:            Due to the release kinetics of cTnI,     a negative result within the first hours     of the onset of symptoms does not rule out     myocardial infarction with certainty.     If myocardial infarction is still suspected,     repeat the test at appropriate intervals.  CBC WITH DIFFERENTIAL     Status: Abnormal   Collection Time    03/10/14  6:15 AM      Result Value Ref Range   WBC 8.2  4.0 - 10.5 K/uL   Comment: WHITE COUNT CONFIRMED ON SMEAR   RBC 3.73 (*) 3.87 - 5.11 MIL/uL   Hemoglobin 10.3 (*) 12.0 -  15.0 g/dL   HCT 30.5 (*) 36.0 - 46.0 %   MCV 81.8  78.0 - 100.0 fL   MCH 27.6  26.0 - 34.0 pg   MCHC 33.8  30.0 - 36.0 g/dL   RDW 17.1 (*) 11.5 - 15.5 %   Platelets PLATELET CLUMPS NOTED ON SMEAR, UNABLE TO ESTIMATE  150 - 400 K/uL   Neutrophils Relative % 66  43 - 77 %   Lymphocytes Relative 24  12 - 46 %   Monocytes Relative 8  3 - 12 %   Eosinophils Relative 1  0 - 5 %   Basophils Relative 1  0 - 1 %   Neutro Abs 5.3  1.7 - 7.7 K/uL   Lymphs Abs 2.0  0.7 - 4.0 K/uL   Monocytes Absolute 0.7  0.1 - 1.0 K/uL   Eosinophils Absolute 0.1  0.0 - 0.7 K/uL   Basophils Absolute 0.1  0.0 - 0.1 K/uL   RBC Morphology BURR CELLS     WBC Morphology ATYPICAL LYMPHOCYTES    COMPREHENSIVE METABOLIC PANEL     Status: Abnormal   Collection Time    03/10/14  6:15 AM      Result Value Ref Range   Sodium 136 (*) 137 - 147 mEq/L   Potassium 4.2  3.7 - 5.3 mEq/L   Chloride 107  96 - 112 mEq/L   CO2 15 (*) 19 - 32 mEq/L   Glucose, Bld 76  70 - 99 mg/dL   BUN 16  6 - 23 mg/dL   Creatinine, Ser 0.53  0.50 - 1.10 mg/dL   Calcium 8.1 (*) 8.4 - 10.5 mg/dL   Total Protein 7.5  6.0 - 8.3 g/dL   Albumin 2.5 (*) 3.5 - 5.2 g/dL   AST 18  0 - 37 U/L   ALT 12  0 - 35 U/L   Alkaline Phosphatase 114  39 - 117 U/L   Total Bilirubin 0.3  0.3 - 1.2 mg/dL   GFR calc non Af Amer >90  >90 mL/min   GFR calc Af Amer >90  >90 mL/min   Comment: (NOTE)     The  eGFR has been calculated using the CKD EPI equation.     This calculation has not been validated in all clinical situations.     eGFR's persistently <90 mL/min signify possible Chronic Kidney     Disease.   Anion gap 14  5 - 15  MONONUCLEOSIS SCREEN     Status: None   Collection Time    03/10/14 11:30 AM      Result Value Ref Range   Mono Screen NEGATIVE  NEGATIVE  LACTATE DEHYDROGENASE, BODY FLUID     Status: Abnormal   Collection Time    03/10/14 12:06 PM      Result Value Ref Range   LD, Fluid 942 (*) 3 - 23 U/L   Fluid Type-FLDH FLUID     Comment:  RIGHT     PLEURAL     CORRECTED ON 08/20 AT 1245: PREVIOUSLY REPORTED AS PLEURAL  PROTEIN, BODY FLUID     Status: None   Collection Time    03/10/14 12:06 PM      Result Value Ref Range   Total protein, fluid 5.0     Comment: NO NORMAL RANGE ESTABLISHED FOR THIS TEST   Fluid Type-FTP FLUID     Comment: RIGHT     PLEURAL     CORRECTED ON 08/20 AT 1246: PREVIOUSLY REPORTED AS PLEURAL  BODY FLUID CELL COUNT WITH DIFFERENTIAL     Status: Abnormal   Collection Time    03/10/14 12:06 PM      Result Value Ref Range   Fluid Type-FCT FLUID     Comment: RIGHT     PLEURAL     CORRECTED ON 08/20 AT 1245: PREVIOUSLY REPORTED AS PLEURAL   Color, Fluid YELLOW (*) YELLOW   Appearance, Fluid HAZY (*) CLEAR   WBC, Fluid 1550 (*) 0 - 1000 cu mm   Neutrophil Count, Fluid 7  0 - 25 %   Lymphs, Fluid 46     Comment: FEW PLASMACYTOID LYMPHS.   Monocyte-Macrophage-Serous Fluid 47 (*) 50 - 90 %   Eos, Fluid 0     Other Cells, Fluid FEW PYKNOTIC NEUTROPHILS NOTED.    BODY FLUID CULTURE     Status: None   Collection Time    03/10/14 12:06 PM      Result Value Ref Range   Specimen Description FLUID RIGHT PLEURAL     Special Requests Normal     Gram Stain       Value: FEW WBC PRESENT,BOTH PMN AND MONONUCLEAR     NO ORGANISMS SEEN     Performed at [facility]   Culture PENDING     Report Status PENDING    PROTEIN, TOTAL     Status: None   Collection Time    03/10/14  1:30 PM      Result Value Ref Range   Total Protein 7.6  6.0 - 8.3 g/dL  CHOLESTEROL, TOTAL     Status: None   Collection Time    03/10/14  1:30 PM      Result Value Ref Range   Cholesterol 92  0 - 200 mg/dL  MONONUCLEOSIS SCREEN     Status: None   Collection Time    03/10/14  1:30 PM      Result Value Ref Range   Mono Screen NEGATIVE  NEGATIVE  KETONES, QUALITATIVE     Status: None   Collection Time    03/10/14  1:30 PM      Result Value Ref  Range   Acetone, Bld NEGATIVE  NEGATIVE  LACTIC ACID, PLASMA      Status: None   Collection Time    03/10/14  1:30 PM      Result Value Ref Range   Lactic Acid, Venous 1.0  0.5 - 2.2 mmol/L  SEDIMENTATION RATE     Status: Abnormal   Collection Time    03/11/14  6:22 AM      Result Value Ref Range   Sed Rate 52 (*) 0 - 22 mm/hr    Imaging / Studies: Dg Wrist 2 Views Right  March 30, 2014   CLINICAL DATA:  Pain and swelling.  EXAM: RIGHT WRIST - 2 VIEW  COMPARISON:  None.  FINDINGS: There is no evidence of fracture or dislocation. There is no evidence of arthropathy or other focal bone abnormality. Soft tissues are unremarkable.  IMPRESSION: Negative.   Electronically Signed   By: Misty Stanley M.D.   On: 03/30/14 14:29   Ct Chest W Contrast  03/30/2014   CLINICAL DATA:  Pleural effusions and adenopathy in the chest  EXAM: CT CHEST WITH CONTRAST  TECHNIQUE: Multidetector CT imaging of the chest was performed during intravenous contrast administration.  CONTRAST:  75m OMNIPAQUE IOHEXOL 300 MG/ML  SOLN  COMPARISON:  Multiple exams, including 0Sep 09, 2015and 02/13/2014  FINDINGS: Bilateral axillary and subpectoral adenopathy, index left axillary lymph node 1.5 cm on image 15 of series 2. Clustered supraclavicular lymph nodes, with a posterior node measuring 0.9 cm on image 6 of series 2.  Right upper paratracheal lymph node 0.7 cm in short axis. Multiple additional mediastinal lymph nodes are present. 9 mm left infrahilar lymph node. Rim calcified of right infrahilar lymph node.  Bilateral internal mammary adenopathy with a right internal mammary noted 0.8 cm on image 29 series 2.  Moderate to large right pleural effusion with a loculation anteriorly. Mild enhancement along the parietal pleura, especially anteriorly on the right near the loculation. Right epicardial lymph node 0.9 cm in short axis, image 41 series 2. Small left pleural effusion. Bilateral passive atelectasis.  Trace pericardial effusion.  I suspect a right glenohumeral joint effusion. No nodules or edema  within the aerated lung.  IMPRESSION: 1. Abnormal increased number of lymph nodes and Mild to moderate abnormal enlargement of lymph nodes in the axillary, subpectoral, internal mammary, infrahilar, mediastinal, and epicardial chains could be due to diffuse reactive adenopathy or lymphatic malignancy. Tissue sampling is likely warranted. 2. Moderate right and small left pleural effusions, with suspected pleural enhancement especially anteriorly on the right where there is loculation of the pleural effusion. This raises suspicion that the right pleural effusion is exudative. 3. Trace pericardial effusion. 4. Suspected right glenohumeral joint effusion.   Electronically Signed   By: WSherryl BartersM.D.   On: 009/09/201515:01   Dg Chest Port 1 View  03/10/2014   CLINICAL DATA:  Status post right thoracentesis.  EXAM: PORTABLE CHEST - 1 VIEW  COMPARISON:  CT chest and PA and lateral chest 009-09-15  FINDINGS: Right pleural effusion is decreased after thoracentesis. No pneumothorax is identified. Left pleural effusion seen on the prior study appears slightly increased. Bibasilar atelectasis is noted.  IMPRESSION: Negative for pneumothorax after right thoracentesis.  Some increase in a small left pleural effusion.   Electronically Signed   By: TInge RiseM.D.   On: 03/10/2014 12:51    Medications / Allergies:  Scheduled Meds: . azithromycin  250 mg Oral Daily  . enoxaparin (LOVENOX) injection  40 mg Subcutaneous Q24H  . ferrous sulfate  325 mg Oral BID WC  . sodium chloride  3 mL Intravenous Q12H   Continuous Infusions:  PRN Meds:.sodium chloride, acetaminophen, acetaminophen, oxyCODONE, phenol-menthol, sodium chloride  Antibiotics: Anti-infectives   Start     Dose/Rate Route Frequency Ordered Stop   03/10/14 1000  azithromycin (ZITHROMAX) tablet 250 mg     250 mg Oral Daily 03/09/14 0429 03/14/14 0959   03/09/14 1000  azithromycin (ZITHROMAX) tablet 500 mg     500 mg Oral Daily 03/09/14  0429 03/09/14 0942   03/09/14 0300  clindamycin (CLEOCIN) IVPB 600 mg     600 mg 100 mL/hr over 30 Minutes Intravenous  Once 03/09/14 0259 03/09/14 0405        Assessment/Plan Diffuse edema/pain  Malaise  IDA  Bilateral pleural effusions  Diffuse lymphadenopathy   Keep NPO and hold lovenox.  Add IVF.  Will proceed with a axillary lymph node biopsy later today.  Graciela to interpret at 10:30am today to review risks and benefits of the procedure.   Erby Pian, [facility] Pager [phone]) For consults and floor pages call [phone]([phone])  03/11/2014  10:07 AM

## 2014-03-11 NOTE — Progress Notes (Signed)
Patient back from OR. Alert and oriented, denied any pain or other discomfort; dressing on right axillary - dry, clean, intact. Will continue to monitor.

## 2014-03-11 NOTE — Progress Notes (Signed)
Patient going to OR for biopsy; nurse was called and report was given.

## 2014-03-11 NOTE — Op Note (Signed)
Pre-op Diagnosis:  Lymphadenopathy Post-op Diagnosis:  Same Procedure: Right axillary lymph node biopsy Surgeon:  Kyrene Longan K. Anesthesia - Gen LMA Indications:  28 yo with fever, malaise, lymphadenopathy.  We are asked to excise a lymph node to aid in diagnosis.  She has an easily palpable node in the right axilla.  Description of procedure:  She was placed in a supine position on the OR table.  After an adequate level of anesthesia was obtained, her right chest and axilla were prepped with Chloraprep and draped in sterile fashion.  A time out was taken.  We infiltrated the area over the palpable node with 0.25% Marcaine.  A transverse incision was made.  Dissection was carried down to the axillary fascia with cautery.  We entered the axilla and immediately identified a couple of large lymph nodes.  We excised the most superficial node with cautery.  This was sent to pathology.  We inspected for hemostasis and closed with 3-0 Vicryl/ 4-0 Monocryl/ steri-strips/ clean dressing.  All counts were corret.  Wilmon ArmsMatthew K. Corliss Skainssuei, MD, [facility]  General/ Trauma Surgery  03/11/2014 2:31 PM

## 2014-03-11 NOTE — Transfer of Care (Signed)
Immediate Anesthesia Transfer of Care Note  Patient: Wendy Kaufman  Procedure(s) Performed: Procedure(s): AXILLARY LYMPH NODE BIOPSY (Right)  Patient Location: PACU  Anesthesia Type:General  Level of Consciousness: awake and alert   Airway & Oxygen Therapy: Patient Spontanous Breathing, Patient connected to nasal cannula oxygen and Patient connected to face mask oxygen  Post-op Assessment: Report given to PACU RN, Post -op Vital signs reviewed and stable and Patient moving all extremities  Post vital signs: Reviewed and stable  Complications: No apparent anesthesia complications

## 2014-03-11 NOTE — Progress Notes (Signed)
Plan right axillary lymph node biopsy under anesthesia today.  The surgical procedure has been discussed with the patient.  Potential risks, benefits, alternative treatments, and expected outcomes have been explained.  All of the patient's questions at this time have been answered.  The likelihood of reaching the patient's treatment goal is good.  The patient understand the proposed surgical procedure and wishes to proceed. Wendy ArmsMatthew K. Corliss Skainssuei, MD, [facility]  General/ Trauma Surgery  03/11/2014 1:08 PM

## 2014-03-11 NOTE — Progress Notes (Signed)
PROGRESS NOTE  JAHARI WIGINTON HRC:[id number] DOB: Feb 21, 1986 DOA: 03/09/2014 PCP: No primary provider on file.  Assessment/Plan: Diffuse adenopathy with positive ANA (1:2560)  -Etiology unclear neoplasm vs vasculitis vs infection  -Obtain HIV antibody--neg  -HIV RNA-neg -CT chest--bilateral axillary and subpectoral lymph nodes, moderate right-sided loculated pleural effusion, paratracheal, hilar, and mediastinal lymph nodes  -LDH is normal  -check C3, C4, CH50, anti-RNP, dsDNA, anti-Smith, lupus anticoagulant, anti-SSA, anti-SSB, RF, ESR, CRP  -consulted surgery for excisional biopsy of LN--performed 03/11/14 -discussed with oncology--need whole lymph node to best make diagnosis of lymphoma  -doubt toxo  -await IGRA results  -dc zithromax on 8/21 Pleural effusion  -Appreciate pulmonary  -03/10/2014 thoracocentesis-700cc removed  -WBC 1550 (94% mono)  -fluid appears exudative--culture neg  -await cytology--neg Diffuse edema  -Etiology is unclear  -Urine protein/creatinine ratio--0.52 not consistent with nephrotic syndrome  -Partly attributable to the patient's low albumin and third spacing  -echocardiogram--EF 68-03%, grade 1 diastolic dysfunction  -Upper extremity duplex negative for DVT, but shows reactive lymph nodes  Sinus tachycardia/SIRS  -Improved with IV hydration  -Secondary to fever and possible infectious process  -pt with low grade fever  -blood cultures--negative to date  Right wrist pain and swelling  -X-ray of right wrist--non acute  -Uric acid--6.7  Iron deficiency anemia  -Check iron studies--iron saturation undetectable  -Start iron supplementation    Family Communication:   Pt at beside Disposition Plan:   Home when medically stable       Procedures/Studies: Ct Abdomen Pelvis Wo Contrast  02/13/2014   CLINICAL DATA:  Right flank pain and hematuria. Lower back pain. Bilateral hand and foot swelling for 3 days.  EXAM: CT ABDOMEN  AND PELVIS WITHOUT CONTRAST  TECHNIQUE: Multidetector CT imaging of the abdomen and pelvis was performed following the standard protocol without IV contrast.  COMPARISON:  Pelvic ultrasound performed 12/25/2012  FINDINGS: Trace bilateral pleural effusions are seen, with mild interstitial prominence. This could reflect minimal interstitial edema.  The liver and spleen are unremarkable in appearance. The gallbladder is within normal limits. The pancreas and adrenal glands are unremarkable.  The kidneys are unremarkable in appearance. There is no evidence of hydronephrosis. No renal or ureteral stones are seen. No perinephric stranding is appreciated.  No free fluid is identified. The small bowel is unremarkable in appearance. The stomach is within normal limits. No acute vascular abnormalities are seen.  Mild soft tissue edema is noted along the lateral abdominal wall bilaterally.  The appendix is normal in caliber and contains air, without evidence for appendicitis. The colon is unremarkable in appearance.  The bladder is mildly distended and grossly unremarkable. The uterus is within normal limits. The ovaries are relatively symmetric. No suspicious adnexal masses are seen. A small right ovarian follicle is noted.  Scattered prominent bilateral inguinal nodes are seen, measuring up to 1.5 cm in short axis. These are of uncertain significance.  No acute osseous abnormalities are identified.  IMPRESSION: 1. No acute abnormalities seen to explain the patient's symptoms. 2. Trace bilateral pleural effusions, with mild interstitial prominence. This could reflect minimal pulmonary edema. 3. Mild soft tissue edema along the lateral abdominal wall bilaterally. 4. Scattered prominent bilateral inguinal nodes, measuring up to 1.5 cm in short axis. These are of uncertain significance, though the largest node, on the right side, has a relatively benign appearance.   Electronically Signed   By: Garald Balding M.D.   On:  02/13/2014  04:14   Dg Chest 2 View  03/09/2014   CLINICAL DATA:  Chest pain and difficulty swallowing.  EXAM: CHEST  2 VIEW  COMPARISON:  None.  FINDINGS: The cardiac silhouette, mediastinal and hilar contours are within normal limits. There are bilateral lower lobe infiltrates and right greater than left pleural effusions. Fairly significant bronchitic changes are also noted. The bony thorax is intact.  IMPRESSION: Extensive bronchitic changes and bibasilar infiltrates and small effusions, right greater than left.   Electronically Signed   By: Kalman Jewels M.D.   On: 03/09/2014 01:50   Dg Wrist 2 Views Right  03/09/2014   CLINICAL DATA:  Pain and swelling.  EXAM: RIGHT WRIST - 2 VIEW  COMPARISON:  None.  FINDINGS: There is no evidence of fracture or dislocation. There is no evidence of arthropathy or other focal bone abnormality. Soft tissues are unremarkable.  IMPRESSION: Negative.   Electronically Signed   By: Misty Stanley M.D.   On: 03/09/2014 14:29   Ct Soft Tissue Neck W Contrast  03/09/2014   CLINICAL DATA:  Neck pain.  EXAM: CT NECK WITH CONTRAST  TECHNIQUE: Multidetector CT imaging of the neck was performed using the standard protocol following the bolus administration of intravenous contrast.  CONTRAST:  64m OMNIPAQUE IOHEXOL 300 MG/ML  SOLN  COMPARISON:  None.  FINDINGS: Low-density expansion of the retropharyngeal space extending from the level of the anterior ring of C1 to C4. There is no peripheral enhancement suggestive of abscess. No cavitation within the lateral retropharyngeal nodes. The adenoid tonsil is enlarged and hyper enhancing, but there is no notable enlargement of the palatine tonsils. Diffuse mild nodal enlargement symmetrically throughout the neck. There is also bilateral axillary lymphadenopathy which is partially visualized. Axillary lymphadenopathy is for a solitary pharyngeal infection.  Prominent size of the bilateral parotid glands, without definite inflammation.  There are 2 punctate calcifications within the bilateral glands. No evidence of mass along the surfaces of the aerodigestive tract. Major cervical vessels are patent. The thyroid gland is unremarkable. There is mild inflammatory mucosal thickening within the bilateral maxillary sinuses.  There is a partially visualized layering right pleural effusion which is at least moderate volume to be visible.  IMPRESSION: 1. Retropharyngeal edema, usually related to complicated pharyngitis/tonsillitis. 2. Diffuse cervical lymphadenopathy which could be related to #1, although the presence of bilateral axillary adenopathy and a moderate right pleural effusion implicates a systemic illness the could be infectious, inflammatory, or lymphoproliferative.   Electronically Signed   By: JJorje GuildM.D.   On: 03/09/2014 02:42   Ct Chest W Contrast  03/09/2014   CLINICAL DATA:  Pleural effusions and adenopathy in the chest  EXAM: CT CHEST WITH CONTRAST  TECHNIQUE: Multidetector CT imaging of the chest was performed during intravenous contrast administration.  CONTRAST:  854mOMNIPAQUE IOHEXOL 300 MG/ML  SOLN  COMPARISON:  Multiple exams, including 03/09/2014 and 02/13/2014  FINDINGS: Bilateral axillary and subpectoral adenopathy, index left axillary lymph node 1.5 cm on image 15 of series 2. Clustered supraclavicular lymph nodes, with a posterior node measuring 0.9 cm on image 6 of series 2.  Right upper paratracheal lymph node 0.7 cm in short axis. Multiple additional mediastinal lymph nodes are present. 9 mm left infrahilar lymph node. Rim calcified of right infrahilar lymph node.  Bilateral internal mammary adenopathy with a right internal mammary noted 0.8 cm on image 29 series 2.  Moderate to large right pleural effusion with a loculation anteriorly. Mild enhancement along the parietal  pleura, especially anteriorly on the right near the loculation. Right epicardial lymph node 0.9 cm in short axis, image 41 series 2. Small  left pleural effusion. Bilateral passive atelectasis.  Trace pericardial effusion.  I suspect a right glenohumeral joint effusion. No nodules or edema within the aerated lung.  IMPRESSION: 1. Abnormal increased number of lymph nodes and Mild to moderate abnormal enlargement of lymph nodes in the axillary, subpectoral, internal mammary, infrahilar, mediastinal, and epicardial chains could be due to diffuse reactive adenopathy or lymphatic malignancy. Tissue sampling is likely warranted. 2. Moderate right and small left pleural effusions, with suspected pleural enhancement especially anteriorly on the right where there is loculation of the pleural effusion. This raises suspicion that the right pleural effusion is exudative. 3. Trace pericardial effusion. 4. Suspected right glenohumeral joint effusion.   Electronically Signed   By: Sherryl Barters M.D.   On: 03/09/2014 15:01   Dg Chest Port 1 View  03/10/2014   CLINICAL DATA:  Status post right thoracentesis.  EXAM: PORTABLE CHEST - 1 VIEW  COMPARISON:  CT chest and PA and lateral chest 03/09/2014.  FINDINGS: Right pleural effusion is decreased after thoracentesis. No pneumothorax is identified. Left pleural effusion seen on the prior study appears slightly increased. Bibasilar atelectasis is noted.  IMPRESSION: Negative for pneumothorax after right thoracentesis.  Some increase in a small left pleural effusion.   Electronically Signed   By: Inge Rise M.D.   On: 03/10/2014 12:51         Subjective: Patient states that the right wrist pain is a little better. Patient denies fevers, chills, headache, chest pain, dyspnea, nausea, vomiting, diarrhea, abdominal pain, dysuria, hematuria   Objective: Filed Vitals:   03/11/14 1515 03/11/14 1530 03/11/14 1545 03/11/14 1612  BP: 104/66 102/66 113/58 103/53  Pulse: 121 120 120 127  Temp:   101.8 F (38.8 C) 99.4 F (37.4 C)  TempSrc:    Oral  Resp: 12 11 13 14   Height:      Weight:      SpO2:  96% 97% 97% 88%    Intake/Output Summary (Last 24 hours) at 03/11/14 1939 Last data filed at 03/11/14 1800  Gross per 24 hour  Intake    940 ml  Output      0 ml  Net    940 ml   Weight change: -1.847 kg (-4 lb 1.1 oz) Exam:   General:  Pt is alert, follows commands appropriately, not in acute distress  HEENT: No icterus, No thrush, bilateral cervical adenopathy [place]/AT  Cardiovascular: RRR, S1/S2, no rubs, no gallops  Respiratory: Bibasilar crackles, right greater than left. No wheezing  Abdomen: Soft/+BS, non tender, non distended, no guarding  Extremities: 1+ UE and LE edema, No lymphangitis, No petechiae, No rashes, mild synovitis the right wrist without crepitance  Data Reviewed: Basic Metabolic Panel:  Recent Labs Lab 03/09/14 0011 03/09/14 0613 03/10/14 0615  NA 130* 136* 136*  K 4.3 4.0 4.2  CL 100 105 107  CO2 18* 18* 15*  GLUCOSE 87 92 76  BUN 17 15 16   CREATININE 0.65 0.57 0.53  CALCIUM 8.2* 7.8* 8.1*   Liver Function Tests:  Recent Labs Lab 03/09/14 0011 03/09/14 0613 03/10/14 0615 03/10/14 1330  AST 27 19 18   --   ALT 15 12 12   --   ALKPHOS 138* 118* 114  --   BILITOT 0.4 0.3 0.3  --   PROT 8.1 7.4 7.5 7.6  ALBUMIN 3.0* 2.6* 2.5*  --  Recent Labs Lab 03/09/14 0011  LIPASE 33   No results found for this basename: AMMONIA,  in the last 168 hours CBC:  Recent Labs Lab 03/09/14 0011 03/09/14 0613 03/10/14 0615  WBC 4.9 4.4 8.2  NEUTROABS  --  3.0 5.3  HGB 10.8* 10.3* 10.3*  HCT 31.8* 30.3* 30.5*  MCV 79.7 79.3 81.8  PLT 355 365 PLATELET CLUMPS NOTED ON SMEAR, UNABLE TO ESTIMATE   Cardiac Enzymes:  Recent Labs Lab 03/09/14 0613 03/09/14 1657  TROPONINI <0.30 <0.30   BNP: No components found with this basename: POCBNP,  CBG: No results found for this basename: GLUCAP,  in the last 168 hours  Recent Results (from the past 240 hour(s))  RAPID STREP SCREEN     Status: None   Collection Time    03/08/14 11:20 PM       Result Value Ref Range Status   Streptococcus, Group A Screen (Direct) NEGATIVE  NEGATIVE Final   Comment: (NOTE)     A Rapid Antigen test may result negative if the antigen level in the     sample is below the detection level of this test. The FDA has not     cleared this test as a stand-alone test therefore the rapid antigen     negative result has reflexed to a Group A Strep culture.  CULTURE, GROUP A STREP     Status: None   Collection Time    03/08/14 11:20 PM      Result Value Ref Range Status   Specimen Description THROAT   Final   Special Requests CX ADDED AT 2343 ON 157262   Final   Culture     Final   Value: No Beta Hemolytic Streptococci Isolated     Performed at [facility]   Report Status 03/10/2014 FINAL   Final  CULTURE, BLOOD (ROUTINE X 2)     Status: None   Collection Time    03/09/14  3:25 PM      Result Value Ref Range Status   Specimen Description BLOOD LEFT ANTECUBITAL   Final   Special Requests BOTTLES DRAWN AEROBIC AND ANAEROBIC 10CC   Final   Culture  Setup Time     Final   Value: 03/09/2014 21:23     Performed at [facility]   Culture     Final   Value:        BLOOD CULTURE RECEIVED NO GROWTH TO DATE CULTURE WILL BE HELD FOR 5 DAYS BEFORE ISSUING A FINAL NEGATIVE REPORT     Performed at [facility]   Report Status PENDING   Incomplete  CULTURE, BLOOD (ROUTINE X 2)     Status: None   Collection Time    03/09/14  3:40 PM      Result Value Ref Range Status   Specimen Description BLOOD RIGHT HAND   Final   Special Requests BOTTLES DRAWN AEROBIC AND ANAEROBIC 5CC   Final   Culture  Setup Time     Final   Value: 03/09/2014 21:22     Performed at [facility]   Culture     Final   Value:        BLOOD CULTURE RECEIVED NO GROWTH TO DATE CULTURE WILL BE HELD FOR 5 DAYS BEFORE ISSUING A FINAL NEGATIVE REPORT     Performed at [facility]   Report Status PENDING   Incomplete  AFB CULTURE WITH SMEAR     Status: None  Collection Time    03/10/14 12:06 PM      Result Value Ref Range Status   Specimen Description FLUID RIGHT PLEURAL   Final   Special Requests Normal   Final   Acid Fast Smear     Final   Value: NO ACID FAST BACILLI SEEN     Performed at [facility]   Culture     Final   Value: CULTURE WILL BE EXAMINED FOR 6 WEEKS BEFORE ISSUING A FINAL REPORT     Performed at [facility]   Report Status PENDING   Incomplete  BODY FLUID CULTURE     Status: None   Collection Time    03/10/14 12:06 PM      Result Value Ref Range Status   Specimen Description FLUID RIGHT PLEURAL   Final   Special Requests Normal   Final   Gram Stain     Final   Value: FEW WBC PRESENT,BOTH PMN AND MONONUCLEAR     NO ORGANISMS SEEN     Performed at [facility]   Culture     Final   Value: NO GROWTH 1 DAY     Performed at [facility]   Report Status PENDING   Incomplete  MRSA PCR SCREENING     Status: None   Collection Time    03/11/14 11:18 AM      Result Value Ref Range Status   MRSA by PCR NEGATIVE  NEGATIVE Final   Comment:            The GeneXpert MRSA Assay (FDA     approved for NASAL specimens     only), is one component of a     comprehensive MRSA colonization     surveillance program. It is not     intended to diagnose MRSA     infection nor to guide or     monitor treatment for     MRSA infections.     Scheduled Meds: .  ceFAZolin (ANCEF) IV  2 g Intravenous On Call to OR  . enoxaparin (LOVENOX) injection  40 mg Subcutaneous Q24H  . ferrous sulfate  325 mg Oral BID WC  . HYDROmorphone      . oxyCODONE      . sodium chloride  3 mL Intravenous Q12H   Continuous Infusions: . dextrose 5 % and 0.9% NaCl 1,000 mL (03/11/14 1623)  . lactated ringers 50 mL/hr at 03/11/14 1254     Kenyetta Fife, DO  [facility] Pager [phone]  If 7PM-7AM, please contact night-coverage [email or web address] Password [facility] 03/11/2014, 7:39 PM   LOS: 2 days

## 2014-03-11 NOTE — Anesthesia Procedure Notes (Signed)
Procedure Name: LMA Insertion Date/Time: 03/11/2014 1:50 PM Performed by: Coralee RudFLORES, Lakishia Bourassa Pre-anesthesia Checklist: Patient identified, Emergency Drugs available, Suction available and Patient being monitored Patient Re-evaluated:Patient Re-evaluated prior to inductionOxygen Delivery Method: Circle system utilized Preoxygenation: Pre-oxygenation with 100% oxygen Intubation Type: IV induction Ventilation: Mask ventilation without difficulty LMA: LMA inserted LMA Size: 4.0 Number of attempts: 1 Placement Confirmation: positive ETCO2 and breath sounds checked- equal and bilateral Tube secured with: Tape Dental Injury: Teeth and Oropharynx as per pre-operative assessment

## 2014-03-11 NOTE — Anesthesia Postprocedure Evaluation (Signed)
Anesthesia Post Note  Patient: Wendy Kaufman  Procedure(s) Performed: Procedure(s) (LRB): AXILLARY LYMPH NODE BIOPSY (Right)  Anesthesia type: general  Patient location: PACU  Post pain: Pain level controlled  Post assessment: Patient's Cardiovascular Status Stable  Last Vitals:  Filed Vitals:   03/11/14 1500  BP: 103/60  Pulse: 119  Temp:   Resp: 20    Post vital signs: Reviewed and stable  Level of consciousness: sedated  Complications: No apparent anesthesia complications

## 2014-03-11 NOTE — Progress Notes (Signed)
Wendy Kaufman, Spanish interpreter was here to interpret and ask if patient understands procedure, if she has any questions,  and if she consents to the procedure.  Pt verbalizes understanding of procedure,gives approval for consented procedure and denies having any further questions.

## 2014-03-11 NOTE — Anesthesia Preprocedure Evaluation (Addendum)
Anesthesia Evaluation  Patient identified by MRN, date of birth, ID band Patient awake    Reviewed: Allergy & Precautions, H&P , NPO status , Patient's Chart, lab work & pertinent test results  Airway Mallampati: II TM Distance: >3 FB Neck ROM: Full    Dental  (+) Teeth Intact, Dental Advisory Given   Pulmonary neg pulmonary ROS,    Pulmonary exam normal       Cardiovascular negative cardio ROS      Neuro/Psych negative neurological ROS  negative psych ROS   GI/Hepatic negative GI ROS, Neg liver ROS,   Endo/Other  negative endocrine ROS  Renal/GU negative Renal ROS     Musculoskeletal   Abdominal   Peds  Hematology   Anesthesia Other Findings   Reproductive/Obstetrics negative OB ROS                          Anesthesia Physical Anesthesia Plan  ASA: III  Anesthesia Plan: General LMA and General   Post-op Pain Management:    Induction: Intravenous  Airway Management Planned:   Additional Equipment:   Intra-op Plan:   Post-operative Plan: Extubation in OR  Informed Consent: I have reviewed the patients History and Physical, chart, labs and discussed the procedure including the risks, benefits and alternatives for the proposed anesthesia with the patient or authorized representative who has indicated his/her understanding and acceptance.   Dental advisory given  Plan Discussed with: CRNA, Anesthesiologist and Surgeon  Anesthesia Plan Comments: (Interpreter was used for the interview)       Anesthesia Quick Evaluation

## 2014-03-12 ENCOUNTER — Inpatient Hospital Stay (HOSPITAL_COMMUNITY): Payer: Medicaid Other

## 2014-03-12 DIAGNOSIS — E871 Hypo-osmolality and hyponatremia: Secondary | ICD-10-CM

## 2014-03-12 DIAGNOSIS — E872 Acidosis, unspecified: Secondary | ICD-10-CM

## 2014-03-12 LAB — COMPREHENSIVE METABOLIC PANEL WITH GFR
ALT: 8 U/L (ref 0–35)
ALT: 9 U/L (ref 0–35)
AST: 15 U/L (ref 0–37)
AST: 28 U/L (ref 0–37)
Albumin: 2 g/dL — ABNORMAL LOW (ref 3.5–5.2)
Albumin: 2 g/dL — ABNORMAL LOW (ref 3.5–5.2)
Alkaline Phosphatase: 82 U/L (ref 39–117)
Alkaline Phosphatase: 94 U/L (ref 39–117)
Anion gap: 11 (ref 5–15)
Anion gap: 12 (ref 5–15)
BUN: 13 mg/dL (ref 6–23)
BUN: 17 mg/dL (ref 6–23)
CO2: 17 meq/L — ABNORMAL LOW (ref 19–32)
CO2: 17 meq/L — ABNORMAL LOW (ref 19–32)
Calcium: 7.3 mg/dL — ABNORMAL LOW (ref 8.4–10.5)
Calcium: 7.3 mg/dL — ABNORMAL LOW (ref 8.4–10.5)
Chloride: 104 meq/L (ref 96–112)
Chloride: 107 meq/L (ref 96–112)
Creatinine, Ser: 0.49 mg/dL — ABNORMAL LOW (ref 0.50–1.10)
Creatinine, Ser: 0.72 mg/dL (ref 0.50–1.10)
GFR calc Af Amer: >90 mL/min
GFR calc Af Amer: >90 mL/min
GFR calc non Af Amer: >90 mL/min
GFR calc non Af Amer: >90 mL/min
Glucose, Bld: 85 mg/dL (ref 70–99)
Glucose, Bld: 94 mg/dL (ref 70–99)
Potassium: 4.1 meq/L (ref 3.7–5.3)
Potassium: 4.7 meq/L (ref 3.7–5.3)
Sodium: 133 meq/L — ABNORMAL LOW (ref 137–147)
Sodium: 135 meq/L — ABNORMAL LOW (ref 137–147)
Total Bilirubin: 0.3 mg/dL (ref 0.3–1.2)
Total Bilirubin: 0.3 mg/dL (ref 0.3–1.2)
Total Protein: 6.6 g/dL (ref 6.0–8.3)
Total Protein: 6.8 g/dL (ref 6.0–8.3)

## 2014-03-12 LAB — CBC WITH DIFFERENTIAL/PLATELET
Basophils Absolute: 0 10*3/uL (ref 0.0–0.1)
Basophils Relative: 0 % (ref 0–1)
Eosinophils Absolute: 0 10*3/uL (ref 0.0–0.7)
Eosinophils Relative: 0 % (ref 0–5)
HCT: 27.5 % — ABNORMAL LOW (ref 36.0–46.0)
Hemoglobin: 9.3 g/dL — ABNORMAL LOW (ref 12.0–15.0)
Lymphocytes Relative: 22 % (ref 12–46)
Lymphs Abs: 1.1 10*3/uL (ref 0.7–4.0)
MCH: 27.4 pg (ref 26.0–34.0)
MCHC: 33.8 g/dL (ref 30.0–36.0)
MCV: 81.1 fL (ref 78.0–100.0)
Monocytes Absolute: 0.3 10*3/uL (ref 0.1–1.0)
Monocytes Relative: 6 % (ref 3–12)
Neutro Abs: 3.6 10*3/uL (ref 1.7–7.7)
Neutrophils Relative %: 72 % (ref 43–77)
Platelets: 371 10*3/uL (ref 150–400)
RBC: 3.39 MIL/uL — ABNORMAL LOW (ref 3.87–5.11)
RDW: 17.1 % — ABNORMAL HIGH (ref 11.5–15.5)
WBC: 5 10*3/uL (ref 4.0–10.5)

## 2014-03-12 LAB — MAGNESIUM
MAGNESIUM: 2 mg/dL (ref 1.5–2.5)
Magnesium: 2 mg/dL (ref 1.5–2.5)

## 2014-03-12 LAB — CBC
HEMATOCRIT: 25.8 % — AB (ref 36.0–46.0)
Hemoglobin: 8.7 g/dL — ABNORMAL LOW (ref 12.0–15.0)
MCH: 26.7 pg (ref 26.0–34.0)
MCHC: 33.7 g/dL (ref 30.0–36.0)
MCV: 79.1 fL (ref 78.0–100.0)
Platelets: 361 10*3/uL (ref 150–400)
RBC: 3.26 MIL/uL — ABNORMAL LOW (ref 3.87–5.11)
RDW: 17 % — AB (ref 11.5–15.5)
WBC: 4.5 10*3/uL (ref 4.0–10.5)

## 2014-03-12 LAB — LACTIC ACID, PLASMA: Lactic Acid, Venous: 1 mmol/L (ref 0.5–2.2)

## 2014-03-12 LAB — QUANTIFERON TB GOLD ASSAY (BLOOD)
Interferon Gamma Release Assay: NEGATIVE
Mitogen value: 4.55 IU/mL
Quantiferon Nil Value: 0.08 IU/mL
TB Ag value: 0.06 IU/mL
TB Antigen Minus Nil Value: 0 IU/mL

## 2014-03-12 LAB — TROPONIN I: Troponin I: <0.3 ng/mL

## 2014-03-12 LAB — PROCALCITONIN: Procalcitonin: 0.22 ng/mL

## 2014-03-12 LAB — C4 COMPLEMENT: Complement C4, Body Fluid: 3 mg/dL — ABNORMAL LOW (ref 10–40)

## 2014-03-12 LAB — C3 COMPLEMENT: C3 Complement: 23 mg/dL — ABNORMAL LOW (ref 90–180)

## 2014-03-12 LAB — COMPLEMENT, TOTAL

## 2014-03-12 MED ORDER — SODIUM CHLORIDE 0.9 % IV SOLN
250.0000 mg | Freq: Four times a day (QID) | INTRAVENOUS | Status: DC
  Administered 2014-03-12 – 2014-03-15 (×12): 250 mg via INTRAVENOUS
  Filled 2014-03-12 (×15): qty 2

## 2014-03-12 MED ORDER — SODIUM CHLORIDE 0.9 % IV SOLN: INTRAVENOUS | Status: DC

## 2014-03-12 MED ORDER — SODIUM CHLORIDE 0.45 % IV SOLN
INTRAVENOUS | Status: DC
  Administered 2014-03-12: 06:00:00 via INTRAVENOUS
  Filled 2014-03-12 (×4): qty 75

## 2014-03-12 MED ORDER — IOHEXOL 350 MG/ML SOLN
80.0000 mL | Freq: Once | INTRAVENOUS | Status: AC | PRN
  Administered 2014-03-12: 80 mL via INTRAVENOUS

## 2014-03-12 MED ORDER — METHYLPREDNISOLONE SODIUM SUCC 125 MG IJ SOLR
125.0000 mg | Freq: Four times a day (QID) | INTRAMUSCULAR | Status: DC
  Filled 2014-03-12 (×3): qty 2

## 2014-03-12 MED ORDER — SODIUM BICARBONATE 650 MG PO TABS
650.0000 mg | ORAL_TABLET | Freq: Two times a day (BID) | ORAL | Status: DC
  Administered 2014-03-12 – 2014-03-15 (×6): 650 mg via ORAL
  Filled 2014-03-12 (×7): qty 1

## 2014-03-12 NOTE — Progress Notes (Addendum)
Pt HR elevated 130s' bpm compared to baseline. EKG done. Wood,MD floor cover made aware. New orders made and carried out. We will continue to monitor.

## 2014-03-12 NOTE — Progress Notes (Addendum)
Wendy Kaufman ZOX:[id number] DOB: [birth year] DOA: 03/09/2014 PCP: No primary provider on file.   Subj: 28 yo HF PMHx None.Presented generalized edema. Pt states that the edema has been going on for over several weeks. Pt also has sore neck. Pt denies fever, chills, cp, palp, sob, orthopnea, pnd. Pt has lower ext edema.  Pt can't recall any hx of blood clots. Pt will be admitted for generalized edema.  8/21 paged to bedside secondary to tachycardia, mild tachypnea, negative CP.   Obj: Objective: VITAL SIGNS: Temp: 99.9 F (37.7 C) (08/21 2252) Temp src: Oral (08/21 2252) BP: 101/67 mmHg (08/21 2118) Pulse Rate: 125 (08/21 2118) SPO2;95% on 2L O2 via   FIO2:   Intake/Output Summary (Last 24 hours) at 03/12/14 0421 Last data filed at 03/11/14 1800  Gross per 24 hour  Intake    940 ml  Output      0 ml  Net    940 ml     Exam: General: A./O. x4, mild acute respiratory distress, Lungs: Clear to auscultation bilaterally without wheezes or crackles Cardiovascular: Tachycardia, Regular rhythm without murmur gallop or rub normal S1 and S2 Abdomen: Nontender, nondistended, soft, bowel sounds positive, no rebound, no ascites, no appreciable mass Extremities: No significant cyanosis, clubbing. Right upper extremity 2-3 + edema hand to elbow.   Procedure/Significant Events: 8/21Right axillary lymph node biopsy    Culture 8/21 right axillary lymph node pathology pending  Antibiotics: NA   A/P Non-anion gap metabolic acidosis -Patient responded well to fluid challenge, tachycardia resolving, fever resolving, bicarbonate increasing. -Continue 0.45% normal saline + sodium bicarbonate 75 mEq at 11825ml/hr -DC lactated Ringer's as exacerbating hyponatremia  Hyponatremia -See none anion gap metabolic acidosis

## 2014-03-12 NOTE — Progress Notes (Signed)
Reviewed results of CTA of chest. With lab results of elevated ANA and low C3, C4 and CH50 and worsening pleural effusion and increase pericardial effusion now requiring oxygenation these findings likely represent lupus with end organ involvement.  Procalcitonin only 0.22.    Start solumedrol 250mg  IV every 6 hrs x 3 days. Pt meets SLICC criteria for Lupus.  DTat

## 2014-03-12 NOTE — Progress Notes (Signed)
PROGRESS NOTE  Wendy Kaufman WGY:[id number] DOB: [birth year] DOA: 03/09/2014 PCP: No primary provider on file.  Assessment/Plan: Diffuse adenopathy with positive ANA (1:2560)  -Etiology unclear neoplasm vs vasculitis vs infection  -Obtain HIV antibody--neg  -HIV RNA-neg  -CT chest--bilateral axillary and subpectoral lymph nodes, moderate right-sided loculated pleural effusion, paratracheal, hilar, and mediastinal lymph nodes  -LDH is normal  -check C3, C4, CH50, anti-RNP, dsDNA, anti-Smith, lupus anticoagulant, anti-SSA, anti-SSB, RF -esr--52 -CRP--6.7  -consulted surgery for excisional biopsy of LN--performed 03/11/14--awaiting pathology  -discussed with oncology--need whole lymph node to best make diagnosis of lymphoma  -doubt toxo  -await IGRA results  -dc zithromax on 8/21  Fever -new fever 102.4 night of 03/11/14 -Likely related to the patient's autoimmune disease -Blood cultures x2 sets -As the patient is hemodynamically stable presently, will remain off antibiotics -Procalcitonin Atypical chest pain -Cycle troponins -EKG shows sinus tachycardia without any ST changes -CT angiogram of the chest as discussed below Pleural effusion  -Appreciate pulmonary  -03/10/2014 thoracocentesis-700cc removed  -WBC 1550 (94% mono)  -fluid appears exudative--culture neg  -await cytology--neg  Acute respiratory failure -Patient developed tachycardia and hypoxemia on the evening of 03/11/2014 -Given concerns for malignancy and presumptive autoimmune disease--> CT angiogram chest to rule out pulmonary embolus -Continue supplemental oxygen Diffuse edema  -Etiology is unclear  -Urine protein/creatinine ratio--0.52 not consistent with nephrotic syndrome  -Partly attributable to the patient's low albumin and third spacing  -echocardiogram--EF 77-93%, grade 1 diastolic dysfunction  -Upper extremity duplex negative for DVT, but shows reactive lymph nodes  Sinus  tachycardia/SIRS  -Improved with IV hydration  -Secondary to fever and possible autoimmune disease -pt with low grade fever  -blood cultures--negative to date  Right wrist pain and swelling  -X-ray of right wrist--non acute  -Uric acid--6.7  Iron deficiency anemia  -Check iron studies--iron saturation undetectable  -Start iron supplementation   Family Communication:   Pt at beside Disposition Plan:   Home when medically stable    Antibiotics:  clinda 8/19 x 1  zithromax 8/19>>8/21    Procedures/Studies: Ct Abdomen Pelvis Wo Contrast  02/13/2014   CLINICAL DATA:  Right flank pain and hematuria. Lower back pain. Bilateral hand and foot swelling for 3 days.  EXAM: CT ABDOMEN AND PELVIS WITHOUT CONTRAST  TECHNIQUE: Multidetector CT imaging of the abdomen and pelvis was performed following the standard protocol without IV contrast.  COMPARISON:  Pelvic ultrasound performed 12/25/2012  FINDINGS: Trace bilateral pleural effusions are seen, with mild interstitial prominence. This could reflect minimal interstitial edema.  The liver and spleen are unremarkable in appearance. The gallbladder is within normal limits. The pancreas and adrenal glands are unremarkable.  The kidneys are unremarkable in appearance. There is no evidence of hydronephrosis. No renal or ureteral stones are seen. No perinephric stranding is appreciated.  No free fluid is identified. The small bowel is unremarkable in appearance. The stomach is within normal limits. No acute vascular abnormalities are seen.  Mild soft tissue edema is noted along the lateral abdominal wall bilaterally.  The appendix is normal in caliber and contains air, without evidence for appendicitis. The colon is unremarkable in appearance.  The bladder is mildly distended and grossly unremarkable. The uterus is within normal limits. The ovaries are relatively symmetric. No suspicious adnexal masses are seen. A small right ovarian follicle is noted.   Scattered prominent bilateral inguinal nodes are seen, measuring up to 1.5 cm in short axis. These are  of uncertain significance.  No acute osseous abnormalities are identified.  IMPRESSION: 1. No acute abnormalities seen to explain the patient's symptoms. 2. Trace bilateral pleural effusions, with mild interstitial prominence. This could reflect minimal pulmonary edema. 3. Mild soft tissue edema along the lateral abdominal wall bilaterally. 4. Scattered prominent bilateral inguinal nodes, measuring up to 1.5 cm in short axis. These are of uncertain significance, though the largest node, on the right side, has a relatively benign appearance.   Electronically Signed   By: Garald Balding M.D.   On: 02/13/2014 04:14   Dg Chest 2 View  03/09/2014   CLINICAL DATA:  Chest pain and difficulty swallowing.  EXAM: CHEST  2 VIEW  COMPARISON:  None.  FINDINGS: The cardiac silhouette, mediastinal and hilar contours are within normal limits. There are bilateral lower lobe infiltrates and right greater than left pleural effusions. Fairly significant bronchitic changes are also noted. The bony thorax is intact.  IMPRESSION: Extensive bronchitic changes and bibasilar infiltrates and small effusions, right greater than left.   Electronically Signed   By: Kalman Jewels M.D.   On: 03/09/2014 01:50   Dg Wrist 2 Views Right  03/09/2014   CLINICAL DATA:  Pain and swelling.  EXAM: RIGHT WRIST - 2 VIEW  COMPARISON:  None.  FINDINGS: There is no evidence of fracture or dislocation. There is no evidence of arthropathy or other focal bone abnormality. Soft tissues are unremarkable.  IMPRESSION: Negative.   Electronically Signed   By: Misty Stanley M.D.   On: 03/09/2014 14:29   Ct Soft Tissue Neck W Contrast  03/09/2014   CLINICAL DATA:  Neck pain.  EXAM: CT NECK WITH CONTRAST  TECHNIQUE: Multidetector CT imaging of the neck was performed using the standard protocol following the bolus administration of intravenous contrast.   CONTRAST:  41m OMNIPAQUE IOHEXOL 300 MG/ML  SOLN  COMPARISON:  None.  FINDINGS: Low-density expansion of the retropharyngeal space extending from the level of the anterior ring of C1 to C4. There is no peripheral enhancement suggestive of abscess. No cavitation within the lateral retropharyngeal nodes. The adenoid tonsil is enlarged and hyper enhancing, but there is no notable enlargement of the palatine tonsils. Diffuse mild nodal enlargement symmetrically throughout the neck. There is also bilateral axillary lymphadenopathy which is partially visualized. Axillary lymphadenopathy is for a solitary pharyngeal infection.  Prominent size of the bilateral parotid glands, without definite inflammation. There are 2 punctate calcifications within the bilateral glands. No evidence of mass along the surfaces of the aerodigestive tract. Major cervical vessels are patent. The thyroid gland is unremarkable. There is mild inflammatory mucosal thickening within the bilateral maxillary sinuses.  There is a partially visualized layering right pleural effusion which is at least moderate volume to be visible.  IMPRESSION: 1. Retropharyngeal edema, usually related to complicated pharyngitis/tonsillitis. 2. Diffuse cervical lymphadenopathy which could be related to #1, although the presence of bilateral axillary adenopathy and a moderate right pleural effusion implicates a systemic illness the could be infectious, inflammatory, or lymphoproliferative.   Electronically Signed   By: JJorje GuildM.D.   On: 03/09/2014 02:42   Ct Chest W Contrast  03/09/2014   CLINICAL DATA:  Pleural effusions and adenopathy in the chest  EXAM: CT CHEST WITH CONTRAST  TECHNIQUE: Multidetector CT imaging of the chest was performed during intravenous contrast administration.  CONTRAST:  869mOMNIPAQUE IOHEXOL 300 MG/ML  SOLN  COMPARISON:  Multiple exams, including 03/09/2014 and 02/13/2014  FINDINGS: Bilateral axillary and subpectoral adenopathy,  index left axillary lymph node 1.5 cm on image 15 of series 2. Clustered supraclavicular lymph nodes, with a posterior node measuring 0.9 cm on image 6 of series 2.  Right upper paratracheal lymph node 0.7 cm in short axis. Multiple additional mediastinal lymph nodes are present. 9 mm left infrahilar lymph node. Rim calcified of right infrahilar lymph node.  Bilateral internal mammary adenopathy with a right internal mammary noted 0.8 cm on image 29 series 2.  Moderate to large right pleural effusion with a loculation anteriorly. Mild enhancement along the parietal pleura, especially anteriorly on the right near the loculation. Right epicardial lymph node 0.9 cm in short axis, image 41 series 2. Small left pleural effusion. Bilateral passive atelectasis.  Trace pericardial effusion.  I suspect a right glenohumeral joint effusion. No nodules or edema within the aerated lung.  IMPRESSION: 1. Abnormal increased number of lymph nodes and Mild to moderate abnormal enlargement of lymph nodes in the axillary, subpectoral, internal mammary, infrahilar, mediastinal, and epicardial chains could be due to diffuse reactive adenopathy or lymphatic malignancy. Tissue sampling is likely warranted. 2. Moderate right and small left pleural effusions, with suspected pleural enhancement especially anteriorly on the right where there is loculation of the pleural effusion. This raises suspicion that the right pleural effusion is exudative. 3. Trace pericardial effusion. 4. Suspected right glenohumeral joint effusion.   Electronically Signed   By: Sherryl Barters M.D.   On: 03/09/2014 15:01   Dg Chest Port 1 View  03/10/2014   CLINICAL DATA:  Status post right thoracentesis.  EXAM: PORTABLE CHEST - 1 VIEW  COMPARISON:  CT chest and PA and lateral chest 03/09/2014.  FINDINGS: Right pleural effusion is decreased after thoracentesis. No pneumothorax is identified. Left pleural effusion seen on the prior study appears slightly increased.  Bibasilar atelectasis is noted.  IMPRESSION: Negative for pneumothorax after right thoracentesis.  Some increase in a small left pleural effusion.   Electronically Signed   By: Inge Rise M.D.   On: 03/10/2014 12:51         Subjective: Patient shortness of breath and chest discomfort overnight without respiratory distress. Presently continues to complain of chest discomfort. Denies any dizziness, nausea, vomiting. Had some fevers and chills. Denies any abdominal pain, dysuria.  Objective: Filed Vitals:   03/11/14 2119 03/11/14 2252 03/12/14 0500 03/12/14 0549  BP:    107/70  Pulse:    108  Temp:  99.9 F (37.7 C)  99.9 F (37.7 C)  TempSrc:  Oral  Oral  Resp:    16  Height:      Weight:   59.376 kg (130 lb 14.4 oz)   SpO2: 95%   97%    Intake/Output Summary (Last 24 hours) at 03/12/14 1051 Last data filed at 03/11/14 1945  Gross per 24 hour  Intake    960 ml  Output      0 ml  Net    960 ml   Weight change: 2.223 kg (4 lb 14.4 oz) Exam:   General:  Pt is alert, follows commands appropriately, not in acute distress  HEENT: No icterus, No thrush,  [place]/AT  Cardiovascular: RRR, S1/S2, no rubs, no gallops  Respiratory: Bibasilar crackles. No wheezing  Abdomen: Soft/+BS, non tender, non distended, no guarding  Extremities: Bilateral upper and lower extremity edema, No lymphangitis, No petechiae, No rashes, no synovitis  Data Reviewed: Basic Metabolic Panel:  Recent Labs Lab 03/09/14 0011 03/09/14 6578 03/10/14 0615 03/11/14 2354 03/12/14 0530 03/12/14  0803  NA 130* 136* 136* 133*  --  135*  K 4.3 4.0 4.2 4.1  --  4.7  CL 100 105 107 104  --  107  CO2 18* 18* 15* 17*  --  17*  GLUCOSE 87 92 76 94  --  85  BUN 17 15 16 17   --  13  CREATININE 0.65 0.57 0.53 0.72  --  0.49*  CALCIUM 8.2* 7.8* 8.1* 7.3*  --  7.3*  MG  --   --   --  2.0 2.0  --    Liver Function Tests:  Recent Labs Lab 03/09/14 0011 03/09/14 0613 03/10/14 0615 03/10/14 1330  03/11/14 2354 03/12/14 0803  AST 27 19 18   --  15 28  ALT 15 12 12   --  8 9  ALKPHOS 138* 118* 114  --  82 94  BILITOT 0.4 0.3 0.3  --  0.3 0.3  PROT 8.1 7.4 7.5 7.6 6.6 6.8  ALBUMIN 3.0* 2.6* 2.5*  --  2.0* 2.0*    Recent Labs Lab 03/09/14 0011  LIPASE 33   No results found for this basename: AMMONIA,  in the last 168 hours CBC:  Recent Labs Lab 03/09/14 0011 03/09/14 0613 03/10/14 0615 03/11/14 2354  WBC 4.9 4.4 8.2 5.0  NEUTROABS  --  3.0 5.3 3.6  HGB 10.8* 10.3* 10.3* 9.3*  HCT 31.8* 30.3* 30.5* 27.5*  MCV 79.7 79.3 81.8 81.1  PLT 355 365 PLATELET CLUMPS NOTED ON SMEAR, UNABLE TO ESTIMATE 371   Cardiac Enzymes:  Recent Labs Lab 03/09/14 0613 03/09/14 1657  TROPONINI <0.30 <0.30   BNP: No components found with this basename: POCBNP,  CBG: No results found for this basename: GLUCAP,  in the last 168 hours  Recent Results (from the past 240 hour(s))  RAPID STREP SCREEN     Status: None   Collection Time    03/08/14 11:20 PM      Result Value Ref Range Status   Streptococcus, Group A Screen (Direct) NEGATIVE  NEGATIVE Final   Comment: (NOTE)     A Rapid Antigen test may result negative if the antigen level in the     sample is below the detection level of this test. The FDA has not     cleared this test as a stand-alone test therefore the rapid antigen     negative result has reflexed to a Group A Strep culture.  CULTURE, GROUP A STREP     Status: None   Collection Time    03/08/14 11:20 PM      Result Value Ref Range Status   Specimen Description THROAT   Final   Special Requests CX ADDED AT 2343 ON [id number]   Final   Culture     Final   Value: No Beta Hemolytic Streptococci Isolated     Performed at [facility]   Report Status 03/10/2014 FINAL   Final  CULTURE, BLOOD (ROUTINE X 2)     Status: None   Collection Time    03/09/14  3:25 PM      Result Value Ref Range Status   Specimen Description BLOOD LEFT ANTECUBITAL   Final   Special  Requests BOTTLES DRAWN AEROBIC AND ANAEROBIC 10CC   Final   Culture  Setup Time     Final   Value: 03/09/2014 21:23     Performed at [facility]   Culture     Final   Value:  BLOOD CULTURE RECEIVED NO GROWTH TO DATE CULTURE WILL BE HELD FOR 5 DAYS BEFORE ISSUING A FINAL NEGATIVE REPORT     Performed at [facility]   Report Status PENDING   Incomplete  CULTURE, BLOOD (ROUTINE X 2)     Status: None   Collection Time    03/09/14  3:40 PM      Result Value Ref Range Status   Specimen Description BLOOD RIGHT HAND   Final   Special Requests BOTTLES DRAWN AEROBIC AND ANAEROBIC 5CC   Final   Culture  Setup Time     Final   Value: 03/09/2014 21:22     Performed at [facility]   Culture     Final   Value:        BLOOD CULTURE RECEIVED NO GROWTH TO DATE CULTURE WILL BE HELD FOR 5 DAYS BEFORE ISSUING A FINAL NEGATIVE REPORT     Performed at [facility]   Report Status PENDING   Incomplete  AFB CULTURE WITH SMEAR     Status: None   Collection Time    03/10/14 12:06 PM      Result Value Ref Range Status   Specimen Description FLUID RIGHT PLEURAL   Final   Special Requests Normal   Final   Acid Fast Smear     Final   Value: NO ACID FAST BACILLI SEEN     Performed at [facility]   Culture     Final   Value: CULTURE WILL BE EXAMINED FOR 6 WEEKS BEFORE ISSUING A FINAL REPORT     Performed at [facility]   Report Status PENDING   Incomplete  BODY FLUID CULTURE     Status: None   Collection Time    03/10/14 12:06 PM      Result Value Ref Range Status   Specimen Description FLUID RIGHT PLEURAL   Final   Special Requests Normal   Final   Gram Stain     Final   Value: FEW WBC PRESENT,BOTH PMN AND MONONUCLEAR     NO ORGANISMS SEEN     Performed at [facility]   Culture     Final   Value: NO GROWTH 1 DAY     Performed at [facility]   Report Status PENDING   Incomplete  MRSA PCR SCREENING     Status: None    Collection Time    03/11/14 11:18 AM      Result Value Ref Range Status   MRSA by PCR NEGATIVE  NEGATIVE Final   Comment:            The GeneXpert MRSA Assay (FDA     approved for NASAL specimens     only), is one component of a     comprehensive MRSA colonization     surveillance program. It is not     intended to diagnose MRSA     infection nor to guide or     monitor treatment for     MRSA infections.     Scheduled Meds: . enoxaparin (LOVENOX) injection  40 mg Subcutaneous Q24H  . ferrous sulfate  325 mg Oral BID WC  . sodium chloride  3 mL Intravenous Q12H   Continuous Infusions: .  sodium bicarbonate  infusion 1000 mL 125 mL/hr at 03/12/14 0535     Wendy Fridman, DO  [facility] Pager [phone]  If 7PM-7AM, please contact night-coverage [email or web address] Password [facility] 03/12/2014, 10:51  AM   LOS: 3 days

## 2014-03-12 NOTE — Progress Notes (Signed)
1 Day Post-Op  Subjective: Pt doing well.  Min pain from R Axilla  Objective: Vital signs in last 24 hours: Temp:  [99.4 F (37.4 C)-102.4 F (39.1 C)] 99.9 F (37.7 C) (08/22 0549) Pulse Rate:  [108-127] 108 (08/22 0549) Resp:  [11-20] 16 (08/22 0549) BP: (101-113)/(53-70) 107/70 mmHg (08/22 0549) SpO2:  [87 %-100 %] 97 % (08/22 0549) Weight:  [130 lb 14.4 oz (59.376 kg)] 130 lb 14.4 oz (59.376 kg) (08/22 0500) Last BM Date: 03/10/14  Intake/Output from previous day: 08/21 0701 - 08/22 0700 In: 1040 [P.O.:240; I.V.:800] Out: -  Intake/Output this shift:    General appearance: alert and cooperative Incision/Wound: wound c/d/i, dressing in place  Lab Results:   Recent Labs  03/10/14 0615 03/11/14 2354  WBC 8.2 5.0  HGB 10.3* 9.3*  HCT 30.5* 27.5*  PLT PLATELET CLUMPS NOTED ON SMEAR, UNABLE TO ESTIMATE 371   BMET  Recent Labs  03/10/14 0615 03/11/14 2354  NA 136* 133*  K 4.2 4.1  CL 107 104  CO2 15* 17*  GLUCOSE 76 94  BUN 16 17  CREATININE 0.53 0.72  CALCIUM 8.1* 7.3*   PT/INR No results found for this basename: LABPROT, INR,  in the last 72 hours ABG  Recent Labs  03/11/14 2210  PHART 7.387  HCO3 17.5*    Studies/Results: Dg Chest Port 1 View  03/10/2014   CLINICAL DATA:  Status post right thoracentesis.  EXAM: PORTABLE CHEST - 1 VIEW  COMPARISON:  CT chest and PA and lateral chest 03/09/2014.  FINDINGS: Right pleural effusion is decreased after thoracentesis. No pneumothorax is identified. Left pleural effusion seen on the prior study appears slightly increased. Bibasilar atelectasis is noted.  IMPRESSION: Negative for pneumothorax after right thoracentesis.  Some increase in a small left pleural effusion.   Electronically Signed   By: Drusilla Kannerhomas  Dalessio M.D.   On: 03/10/2014 12:51    Anti-infectives: Anti-infectives   Start     Dose/Rate Route Frequency Ordered Stop   03/11/14 1130  ceFAZolin (ANCEF) IVPB 2 g/50 mL premix    Comments:  Pharmacy  may adjust dosing strength, interval, or rate of medication as needed for optimal therapy for the patient  Send with patient on call to the OR.  Anesthesia to complete antibiotic administration <4560min prior [facility].   2 g 100 mL/hr over 30 Minutes Intravenous On call to O.R. 03/11/14 1119 03/12/14 0559   03/10/14 1000  azithromycin (ZITHROMAX) tablet 250 mg  Status:  Discontinued     250 mg Oral Daily 03/09/14 0429 03/11/14 1102   03/09/14 1000  azithromycin (ZITHROMAX) tablet 500 mg     500 mg Oral Daily 03/09/14 0429 03/09/14 0942   03/09/14 0300  clindamycin (CLEOCIN) IVPB 600 mg     600 mg 100 mL/hr over 30 Minutes Intravenous  Once 03/09/14 0259 03/09/14 0405      Assessment/Plan: s/p Procedure(s): AXILLARY LYMPH NODE BIOPSY (Right) Wound look good. Routine wound care.  Can DC dressing Sunday AM. Path pending Pt can f/u with us as needed in clinic when discharge Call with questions   LOS: 3 days    Marigene Ehlersamirez Jr., [facility] 03/12/2014

## 2014-03-13 LAB — BASIC METABOLIC PANEL
Anion gap: 11 (ref 5–15)
BUN: 12 mg/dL (ref 6–23)
CO2: 18 meq/L — AB (ref 19–32)
Calcium: 7.7 mg/dL — ABNORMAL LOW (ref 8.4–10.5)
Chloride: 105 mEq/L (ref 96–112)
Creatinine, Ser: 0.44 mg/dL — ABNORMAL LOW (ref 0.50–1.10)
GFR calc Af Amer: >90 mL/min (ref >90)
GFR calc non Af Amer: >90 mL/min (ref >90)
GLUCOSE: 118 mg/dL — AB (ref 70–99)
POTASSIUM: 4.5 meq/L (ref 3.7–5.3)
SODIUM: 134 meq/L — AB (ref 137–147)

## 2014-03-13 LAB — CBC
HEMATOCRIT: 27.1 % — AB (ref 36.0–46.0)
Hemoglobin: 9.3 g/dL — ABNORMAL LOW (ref 12.0–15.0)
MCH: 27 pg (ref 26.0–34.0)
MCHC: 34.3 g/dL (ref 30.0–36.0)
MCV: 78.8 fL (ref 78.0–100.0)
PLATELETS: 347 10*3/uL (ref 150–400)
RBC: 3.44 MIL/uL — AB (ref 3.87–5.11)
RDW: 16.9 % — AB (ref 11.5–15.5)
WBC: 3.6 10*3/uL — AB (ref 4.0–10.5)

## 2014-03-13 LAB — BODY FLUID CULTURE
CULTURE: NO GROWTH
SPECIAL REQUESTS: NORMAL

## 2014-03-13 LAB — ANA, BODY FLUID: Anti-Nuclear Ab, IgG: DETECTED — AB

## 2014-03-13 NOTE — Progress Notes (Signed)
PROGRESS NOTE  Wendy Kaufman ZDG:[id number] DOB: 07/09/1986 DOA: 03/09/2014 PCP: No primary provider on file.  Assessment/Plan: Diffuse adenopathy with positive ANA (1:2560)  -pt likely has lupus -Obtain HIV antibody--neg  -HIV RNA-neg  -CT chest--bilateral axillary and subpectoral lymph nodes, moderate right-sided loculated pleural effusion, paratracheal, hilar, and mediastinal lymph nodes  -LDH is normal  -check C3, C4, CH50--all low  -anti-RNP, dsDNA, anti-Smith, lupus anticoagulant, anti-SSA, anti-SSB -RF--neg -esr--52  -CRP--6.7  -consulted surgery for excisional biopsy of LN--performed 03/11/14--awaiting pathology  -discussed with oncology--need whole lymph node to best make diagnosis of lymphoma  -await IGRA results--neg  -dc zithromax on 8/21 -03/12/14 evening-->started IV solumedrol  Fever  -secondary to lupus -Improved with steroids -Likely related to the patient's autoimmune disease  -Blood cultures x2 sets  -As the patient is hemodynamically stable presently, will remain off antibiotics  -Procalcitonin--0.22  Atypical chest pain  -Cycle troponins--neg  -EKG shows sinus tachycardia without any ST changes  -CT angiogram of the chest as discussed below  Pleural effusion  -Appreciate pulmonary  -03/10/2014 thoracocentesis-700cc removed  -WBC 1550 (94% mono)  -fluid appears exudative--culture neg  -await cytology--neg  -ANA from pleural fluid is positive Acute respiratory failure  -Patient developed tachycardia and hypoxemia on the evening of 03/11/2014  -CTA chest neg for PE but with b/l pleural effusion and increase pericardial effusion-->repeat echo to eval pericardial effusion -Continue supplemental oxygen  Diffuse edema  -secondary to third spacing,?degree if lymphatic obstruction from lymphadenopathy -Urine protein/creatinine ratio--0.52 not consistent with nephrotic syndrome  -Partly attributable to the patient's low albumin and third  spacing  -echocardiogram--EF 59-56%, grade 1 diastolic dysfunction  -Upper extremity duplex negative for DVT, but shows reactive lymph nodes  Sinus tachycardia/SIRS  -Improved with IV hydration  -Secondary to fever and possible autoimmune disease  -pt with low grade fever  -blood cultures--negative to date  Right wrist pain and swelling  -X-ray of right wrist--non acute  -Uric acid--6.7  -improving with steroids Iron deficiency anemia  -Check iron studies--iron saturation undetectable  -Start iron supplementation  Family Communication: spouse updated at beside  Disposition Plan: Home when medically stable  Antibiotics:  clinda 8/19 x 1  zithromax 8/19>>8/21          Procedures/Studies: Ct Abdomen Pelvis Wo Contrast  02/13/2014   CLINICAL DATA:  Right flank pain and hematuria. Lower back pain. Bilateral hand and foot swelling for 3 days.  EXAM: CT ABDOMEN AND PELVIS WITHOUT CONTRAST  TECHNIQUE: Multidetector CT imaging of the abdomen and pelvis was performed following the standard protocol without IV contrast.  COMPARISON:  Pelvic ultrasound performed 12/25/2012  FINDINGS: Trace bilateral pleural effusions are seen, with mild interstitial prominence. This could reflect minimal interstitial edema.  The liver and spleen are unremarkable in appearance. The gallbladder is within normal limits. The pancreas and adrenal glands are unremarkable.  The kidneys are unremarkable in appearance. There is no evidence of hydronephrosis. No renal or ureteral stones are seen. No perinephric stranding is appreciated.  No free fluid is identified. The small bowel is unremarkable in appearance. The stomach is within normal limits. No acute vascular abnormalities are seen.  Mild soft tissue edema is noted along the lateral abdominal wall bilaterally.  The appendix is normal in caliber and contains air, without evidence for appendicitis. The colon is unremarkable in appearance.  The bladder is mildly  distended and grossly unremarkable. The uterus is within normal limits. The ovaries are relatively symmetric.  No suspicious adnexal masses are seen. A small right ovarian follicle is noted.  Scattered prominent bilateral inguinal nodes are seen, measuring up to 1.5 cm in short axis. These are of uncertain significance.  No acute osseous abnormalities are identified.  IMPRESSION: 1. No acute abnormalities seen to explain the patient's symptoms. 2. Trace bilateral pleural effusions, with mild interstitial prominence. This could reflect minimal pulmonary edema. 3. Mild soft tissue edema along the lateral abdominal wall bilaterally. 4. Scattered prominent bilateral inguinal nodes, measuring up to 1.5 cm in short axis. These are of uncertain significance, though the largest node, on the right side, has a relatively benign appearance.   Electronically Signed   By: Garald Balding M.D.   On: 02/13/2014 04:14   Dg Chest 2 View  03/09/2014   CLINICAL DATA:  Chest pain and difficulty swallowing.  EXAM: CHEST  2 VIEW  COMPARISON:  None.  FINDINGS: The cardiac silhouette, mediastinal and hilar contours are within normal limits. There are bilateral lower lobe infiltrates and right greater than left pleural effusions. Fairly significant bronchitic changes are also noted. The bony thorax is intact.  IMPRESSION: Extensive bronchitic changes and bibasilar infiltrates and small effusions, right greater than left.   Electronically Signed   By: Kalman Jewels M.D.   On: 03/09/2014 01:50   Dg Wrist 2 Views Right  03/09/2014   CLINICAL DATA:  Pain and swelling.  EXAM: RIGHT WRIST - 2 VIEW  COMPARISON:  None.  FINDINGS: There is no evidence of fracture or dislocation. There is no evidence of arthropathy or other focal bone abnormality. Soft tissues are unremarkable.  IMPRESSION: Negative.   Electronically Signed   By: Misty Stanley M.D.   On: 03/09/2014 14:29   Ct Soft Tissue Neck W Contrast  03/09/2014   CLINICAL DATA:  Neck  pain.  EXAM: CT NECK WITH CONTRAST  TECHNIQUE: Multidetector CT imaging of the neck was performed using the standard protocol following the bolus administration of intravenous contrast.  CONTRAST:  76m OMNIPAQUE IOHEXOL 300 MG/ML  SOLN  COMPARISON:  None.  FINDINGS: Low-density expansion of the retropharyngeal space extending from the level of the anterior ring of C1 to C4. There is no peripheral enhancement suggestive of abscess. No cavitation within the lateral retropharyngeal nodes. The adenoid tonsil is enlarged and hyper enhancing, but there is no notable enlargement of the palatine tonsils. Diffuse mild nodal enlargement symmetrically throughout the neck. There is also bilateral axillary lymphadenopathy which is partially visualized. Axillary lymphadenopathy is for a solitary pharyngeal infection.  Prominent size of the bilateral parotid glands, without definite inflammation. There are 2 punctate calcifications within the bilateral glands. No evidence of mass along the surfaces of the aerodigestive tract. Major cervical vessels are patent. The thyroid gland is unremarkable. There is mild inflammatory mucosal thickening within the bilateral maxillary sinuses.  There is a partially visualized layering right pleural effusion which is at least moderate volume to be visible.  IMPRESSION: 1. Retropharyngeal edema, usually related to complicated pharyngitis/tonsillitis. 2. Diffuse cervical lymphadenopathy which could be related to #1, although the presence of bilateral axillary adenopathy and a moderate right pleural effusion implicates a systemic illness the could be infectious, inflammatory, or lymphoproliferative.   Electronically Signed   By: JJorje GuildM.D.   On: 03/09/2014 02:42   Ct Chest W Contrast  03/09/2014   CLINICAL DATA:  Pleural effusions and adenopathy in the chest  EXAM: CT CHEST WITH CONTRAST  TECHNIQUE: Multidetector CT imaging of the chest was  performed during intravenous contrast  administration.  CONTRAST:  63m OMNIPAQUE IOHEXOL 300 MG/ML  SOLN  COMPARISON:  Multiple exams, including 03/09/2014 and 02/13/2014  FINDINGS: Bilateral axillary and subpectoral adenopathy, index left axillary lymph node 1.5 cm on image 15 of series 2. Clustered supraclavicular lymph nodes, with a posterior node measuring 0.9 cm on image 6 of series 2.  Right upper paratracheal lymph node 0.7 cm in short axis. Multiple additional mediastinal lymph nodes are present. 9 mm left infrahilar lymph node. Rim calcified of right infrahilar lymph node.  Bilateral internal mammary adenopathy with a right internal mammary noted 0.8 cm on image 29 series 2.  Moderate to large right pleural effusion with a loculation anteriorly. Mild enhancement along the parietal pleura, especially anteriorly on the right near the loculation. Right epicardial lymph node 0.9 cm in short axis, image 41 series 2. Small left pleural effusion. Bilateral passive atelectasis.  Trace pericardial effusion.  I suspect a right glenohumeral joint effusion. No nodules or edema within the aerated lung.  IMPRESSION: 1. Abnormal increased number of lymph nodes and Mild to moderate abnormal enlargement of lymph nodes in the axillary, subpectoral, internal mammary, infrahilar, mediastinal, and epicardial chains could be due to diffuse reactive adenopathy or lymphatic malignancy. Tissue sampling is likely warranted. 2. Moderate right and small left pleural effusions, with suspected pleural enhancement especially anteriorly on the right where there is loculation of the pleural effusion. This raises suspicion that the right pleural effusion is exudative. 3. Trace pericardial effusion. 4. Suspected right glenohumeral joint effusion.   Electronically Signed   By: WSherryl BartersM.D.   On: 03/09/2014 15:01   Ct Angio Chest Pe W/cm &/or Wo Cm  03/12/2014   CLINICAL DATA:  Hypoxemia, chest pain, present and lupus, adenopathy, effusions  EXAM: CT ANGIOGRAPHY CHEST  WITH CONTRAST  TECHNIQUE: Multidetector CT imaging of the chest was performed using the standard protocol during bolus administration of intravenous contrast. Multiplanar CT image reconstructions and MIPs were obtained to evaluate the vascular anatomy.  CONTRAST:  878mOMNIPAQUE IOHEXOL 350 MG/ML SOLN  COMPARISON:  03/09/2014  FINDINGS: Pulmonary arteries appear patent. No significant filling defect or pulmonary embolus demonstrated by CTA. Intact thoracic aorta. Negative for aneurysm or dissection. Mild cardiomegaly. Slight enlarging pericardial effusion noted. Enlarging bilateral pleural effusions which is partially loculated on the right. Increased bilateral lower lobe compressive atelectasis/ consolidation. Lingula and right middle lobe streaky subsegmental atelectasis as well. Motion artifact throughout the upper lobes.  Bilateral axillary adenopathy. Postop changes in the right axilla, suspect recent lymph node resection.  Stable mildly prominent right pericardial lymph node inferiorly. Included upper abdomen demonstrates no acute process.  No acute osseous finding.  Review of the MIP images confirms the above findings.  IMPRESSION: Negative for significant acute pulmonary embolus by CTA.  Enlarging bilateral pleural effusions with increased lower lobe compressive atelectasis/ consolidation.  Additional lingula and right middle lobe subsegmental atelectasis.  Small pericardial effusion  Bilateral axillary adenopathy   Electronically Signed   By: TrDaryll Brod.D.   On: 03/12/2014 13:13   Dg Chest Port 1 View  03/10/2014   CLINICAL DATA:  Status post right thoracentesis.  EXAM: PORTABLE CHEST - 1 VIEW  COMPARISON:  CT chest and PA and lateral chest 03/09/2014.  FINDINGS: Right pleural effusion is decreased after thoracentesis. No pneumothorax is identified. Left pleural effusion seen on the prior study appears slightly increased. Bibasilar atelectasis is noted.  IMPRESSION: Negative for pneumothorax after  right thoracentesis.  Some increase in a small left pleural effusion.   Electronically Signed   By: Inge Rise M.D.   On: 03/10/2014 12:51         Subjective: Patient states that her breathing and extremity pains are improving. Denies fevers, chills, chest pain, nausea, vomiting, diarrhea, abdominal pain, dysuria, hematuria. No headache or visual disturbance   Objective: Filed Vitals:   03/12/14 1357 03/12/14 2136 03/13/14 0123 03/13/14 0613  BP: 110/76 114/75  124/79  Pulse: 120 115  87  Temp: 97.3 F (36.3 C) 101.4 F (38.6 C) 98.9 F (37.2 C) 98 F (36.7 C)  TempSrc: Oral Oral Oral Oral  Resp: 18 18  17   Height:      Weight:    57.788 kg (127 lb 6.4 oz)  SpO2: 93% 95%  95%    Intake/Output Summary (Last 24 hours) at 03/13/14 1235 Last data filed at 03/12/14 1810  Gross per 24 hour  Intake 1572.92 ml  Output      0 ml  Net 1572.92 ml   Weight change: -1.588 kg (-3 lb 8 oz) Exam:   General:  Pt is alert, follows commands appropriately, not in acute distress  HEENT: No icterus, No thrush, [place]/AT  Cardiovascular: RRR, S1/S2, no rubs, no gallops  Respiratory: Bibasilar crackles with diminished breath sounds at the bilateral bases  Abdomen: Soft/+BS, non tender, non distended, no guarding  Extremities: 2+LE and UE edema, No lymphangitis, No petechiae, No rashes, no synovitis  Data Reviewed: Basic Metabolic Panel:  Recent Labs Lab 03/09/14 0613 03/10/14 0615 03/11/14 2354 03/12/14 0530 03/12/14 0803 03/13/14 0515  NA 136* 136* 133*  --  135* 134*  K 4.0 4.2 4.1  --  4.7 4.5  CL 105 107 104  --  107 105  CO2 18* 15* 17*  --  17* 18*  GLUCOSE 92 76 94  --  85 118*  BUN 15 16 17   --  13 12  CREATININE 0.57 0.53 0.72  --  0.49* 0.44*  CALCIUM 7.8* 8.1* 7.3*  --  7.3* 7.7*  MG  --   --  2.0 2.0  --   --    Liver Function Tests:  Recent Labs Lab 03/09/14 0011 03/09/14 0613 03/10/14 0615 03/10/14 1330 03/11/14 2354 03/12/14 0803  AST 27 19  18   --  15 28  ALT 15 12 12   --  8 9  ALKPHOS 138* 118* 114  --  82 94  BILITOT 0.4 0.3 0.3  --  0.3 0.3  PROT 8.1 7.4 7.5 7.6 6.6 6.8  ALBUMIN 3.0* 2.6* 2.5*  --  2.0* 2.0*    Recent Labs Lab 03/09/14 0011  LIPASE 33   No results found for this basename: AMMONIA,  in the last 168 hours CBC:  Recent Labs Lab 03/09/14 0613 03/10/14 0615 03/11/14 2354 03/12/14 1120 03/13/14 0515  WBC 4.4 8.2 5.0 4.5 3.6*  NEUTROABS 3.0 5.3 3.6  --   --   HGB 10.3* 10.3* 9.3* 8.7* 9.3*  HCT 30.3* 30.5* 27.5* 25.8* 27.1*  MCV 79.3 81.8 81.1 79.1 78.8  PLT 365 PLATELET CLUMPS NOTED ON SMEAR, UNABLE TO ESTIMATE 371 361 347   Cardiac Enzymes:  Recent Labs Lab 03/09/14 0613 03/09/14 1657 03/12/14 1120  TROPONINI <0.30 <0.30 <0.30   BNP: No components found with this basename: POCBNP,  CBG: No results found for this basename: GLUCAP,  in the last 168 hours  Recent Results (from the past 240 hour(s))  RAPID  STREP SCREEN     Status: None   Collection Time    03/08/14 11:20 PM      Result Value Ref Range Status   Streptococcus, Group A Screen (Direct) NEGATIVE  NEGATIVE Final   Comment: (NOTE)     A Rapid Antigen test may result negative if the antigen level in the     sample is below the detection level of this test. The FDA has not     cleared this test as a stand-alone test therefore the rapid antigen     negative result has reflexed to a Group A Strep culture.  CULTURE, GROUP A STREP     Status: None   Collection Time    03/08/14 11:20 PM      Result Value Ref Range Status   Specimen Description THROAT   Final   Special Requests CX ADDED AT 2343 ON [id number]   Final   Culture     Final   Value: No Beta Hemolytic Streptococci Isolated     Performed at [facility]   Report Status 03/10/2014 FINAL   Final  CULTURE, BLOOD (ROUTINE X 2)     Status: None   Collection Time    03/09/14  3:25 PM      Result Value Ref Range Status   Specimen Description BLOOD LEFT ANTECUBITAL    Final   Special Requests BOTTLES DRAWN AEROBIC AND ANAEROBIC 10CC   Final   Culture  Setup Time     Final   Value: 03/09/2014 21:23     Performed at [facility]   Culture     Final   Value:        BLOOD CULTURE RECEIVED NO GROWTH TO DATE CULTURE WILL BE HELD FOR 5 DAYS BEFORE ISSUING A FINAL NEGATIVE REPORT     Performed at [facility]   Report Status PENDING   Incomplete  CULTURE, BLOOD (ROUTINE X 2)     Status: None   Collection Time    03/09/14  3:40 PM      Result Value Ref Range Status   Specimen Description BLOOD RIGHT HAND   Final   Special Requests BOTTLES DRAWN AEROBIC AND ANAEROBIC 5CC   Final   Culture  Setup Time     Final   Value: 03/09/2014 21:22     Performed at [facility]   Culture     Final   Value:        BLOOD CULTURE RECEIVED NO GROWTH TO DATE CULTURE WILL BE HELD FOR 5 DAYS BEFORE ISSUING A FINAL NEGATIVE REPORT     Performed at [facility]   Report Status PENDING   Incomplete  AFB CULTURE WITH SMEAR     Status: None   Collection Time    03/10/14 12:06 PM      Result Value Ref Range Status   Specimen Description FLUID RIGHT PLEURAL   Final   Special Requests Normal   Final   Acid Fast Smear     Final   Value: NO ACID FAST BACILLI SEEN     Performed at [facility]   Culture     Final   Value: CULTURE WILL BE EXAMINED FOR 6 WEEKS BEFORE ISSUING A FINAL REPORT     Performed at [facility]   Report Status PENDING   Incomplete  BODY FLUID CULTURE     Status: None   Collection Time    03/10/14 12:06  PM      Result Value Ref Range Status   Specimen Description FLUID RIGHT PLEURAL   Final   Special Requests Normal   Final   Gram Stain     Final   Value: FEW WBC PRESENT,BOTH PMN AND MONONUCLEAR     NO ORGANISMS SEEN     Performed at [facility]   Culture     Final   Value: NO GROWTH 2 DAYS     Performed at [facility]   Report Status PENDING   Incomplete  MRSA PCR SCREENING      Status: None   Collection Time    03/11/14 11:18 AM      Result Value Ref Range Status   MRSA by PCR NEGATIVE  NEGATIVE Final   Comment:            The GeneXpert MRSA Assay (FDA     approved for NASAL specimens     only), is one component of a     comprehensive MRSA colonization     surveillance program. It is not     intended to diagnose MRSA     infection nor to guide or     monitor treatment for     MRSA infections.     Scheduled Meds: . enoxaparin (LOVENOX) injection  40 mg Subcutaneous Q24H  . ferrous sulfate  325 mg Oral BID WC  . methylPREDNISolone (SOLU-MEDROL) injection  250 mg Intravenous Q6H  . sodium bicarbonate  650 mg Oral BID  . sodium chloride  3 mL Intravenous Q12H   Continuous Infusions:    Elior Robinette, DO  [facility] Pager [phone]  If 7PM-7AM, please contact night-coverage [email or web address] Password [facility] 03/13/2014, 12:35 PM   LOS: 4 days

## 2014-03-14 ENCOUNTER — Encounter (HOSPITAL_COMMUNITY): Payer: Self-pay | Admitting: Surgery

## 2014-03-14 DIAGNOSIS — I369 Nonrheumatic tricuspid valve disorder, unspecified: Secondary | ICD-10-CM

## 2014-03-14 DIAGNOSIS — I776 Arteritis, unspecified: Secondary | ICD-10-CM

## 2014-03-14 DIAGNOSIS — M329 Systemic lupus erythematosus, unspecified: Principal | ICD-10-CM

## 2014-03-14 DIAGNOSIS — I7789 Other specified disorders of arteries and arterioles: Secondary | ICD-10-CM

## 2014-03-14 LAB — ANTI-DNA ANTIBODY, DOUBLE-STRANDED: DS DNA AB: 2510 [IU]/mL — AB

## 2014-03-14 LAB — BASIC METABOLIC PANEL
ANION GAP: 9 (ref 5–15)
BUN: 22 mg/dL (ref 6–23)
CHLORIDE: 106 meq/L (ref 96–112)
CO2: 19 mEq/L (ref 19–32)
Calcium: 7.7 mg/dL — ABNORMAL LOW (ref 8.4–10.5)
Creatinine, Ser: 0.48 mg/dL — ABNORMAL LOW (ref 0.50–1.10)
GFR calc Af Amer: >90 mL/min (ref >90)
GFR calc non Af Amer: >90 mL/min (ref >90)
Glucose, Bld: 162 mg/dL — ABNORMAL HIGH (ref 70–99)
POTASSIUM: 4.2 meq/L (ref 3.7–5.3)
Sodium: 134 mEq/L — ABNORMAL LOW (ref 137–147)

## 2014-03-14 LAB — CBC
HCT: 26.8 % — ABNORMAL LOW (ref 36.0–46.0)
Hemoglobin: 9.2 g/dL — ABNORMAL LOW (ref 12.0–15.0)
MCH: 27.5 pg (ref 26.0–34.0)
MCHC: 34.3 g/dL (ref 30.0–36.0)
MCV: 80.2 fL (ref 78.0–100.0)
Platelets: 367 10*3/uL (ref 150–400)
RBC: 3.34 MIL/uL — AB (ref 3.87–5.11)
RDW: 17.1 % — ABNORMAL HIGH (ref 11.5–15.5)
WBC: 3.5 10*3/uL — ABNORMAL LOW (ref 4.0–10.5)

## 2014-03-14 LAB — PROCALCITONIN: PROCALCITONIN: 0.17 ng/mL

## 2014-03-14 LAB — LUPUS ANTICOAGULANT PANEL
DRVVT: 25.9 s (ref <42.9)
Lupus Anticoagulant: NOT DETECTED
PTT Lupus Anticoagulant: 40.4 secs (ref 28.0–43.0)

## 2014-03-14 LAB — ANTI-SMITH ANTIBODY: ENA SM Ab Ser-aCnc: >8

## 2014-03-14 LAB — ANTI-RIBONUCLEIC ACID ANTIBODY: Sm/rnp: >8

## 2014-03-14 LAB — SJOGRENS SYNDROME-A EXTRACTABLE NUCLEAR ANTIBODY: SSA (Ro) (ENA) Antibody, IgG: >8

## 2014-03-14 LAB — SJOGRENS SYNDROME-B EXTRACTABLE NUCLEAR ANTIBODY: SSB (La) (ENA) Antibody, IgG: 6

## 2014-03-14 LAB — CARDIOLIPIN ANTIBODY: PHOSPHOLIPIDS: 123 mg/dL — AB (ref 151–264)

## 2014-03-14 MED ORDER — OXYCODONE HCL 5 MG PO TABS
10.0000 mg | ORAL_TABLET | ORAL | Status: DC | PRN
Start: 2014-03-14 — End: 2014-03-15
  Administered 2014-03-14 – 2014-03-15 (×3): 10 mg via ORAL
  Filled 2014-03-14 (×3): qty 2

## 2014-03-14 NOTE — Progress Notes (Signed)
  Echocardiogram 2D Echocardiogram has been performed.  Dorothey Baseman 03/14/2014, 10:33 AM

## 2014-03-14 NOTE — Progress Notes (Signed)
PROGRESS NOTE  Wendy Kaufman TJQ:[id number] DOB: Dec 18, 1985 DOA: 03/09/2014 PCP: No primary provider on file.  Assessment/Plan: Diffuse adenopathy with positive ANA (1:2560)  -pt likely has lupus  -Obtain HIV antibody--neg  -HIV RNA-neg  -CT chest--bilateral axillary and subpectoral lymph nodes, moderate right-sided loculated pleural effusion, paratracheal, hilar, and mediastinal lymph nodes  -LDH is normal  -check C3, C4, CH50--all low  -anti-RNP,anti-SSA, anti-SSB--all positive -dsDNA-2510, anti-Smith--positive -esr--52   -CRP--6.7  -consulted surgery for excisional biopsy of LN--performed 03/11/14--awaiting pathology  -discussed with oncology--need whole lymph node to best make diagnosis of lymphoma  -await IGRA results--neg  -dc zithromax on 8/21  -03/12/14 evening-->started IV solumedrol  Fever  -secondary to lupus  -Improved with steroids  -Likely related to the patient's autoimmune disease  -Blood cultures x2 sets  -As the patient is hemodynamically stable presently, will remain off antibiotics  -Procalcitonin--0.22  Atypical chest pain  -Cycle troponins--neg  -EKG shows sinus tachycardia without any ST changes  -CT angiogram of the chest as discussed below  -03/14/14 repeat echo--unchanged pericardial effusion Pleural effusion  -Appreciate pulmonary consult -03/10/2014 thoracocentesis-700cc removed  -WBC 1550 (94% mono)  -fluid appears exudative--culture neg  -await cytology--neg  -ANA from pleural fluid is positive  Acute respiratory failure  -Patient developed tachycardia and hypoxemia on the evening of 03/11/2014  -CTA chest neg for PE but with b/l pleural effusion and increase pericardial effusion-->repeat echo to eval pericardial effusion  -Continue supplemental oxygen-->weaned to RA after starting steroids Diffuse edema  -secondary to third spacing,?degree if lymphatic obstruction from lymphadenopathy  -Urine protein/creatinine ratio--0.52  not consistent with nephrotic syndrome  -Partly attributable to the patient's low albumin and third spacing  -echocardiogram--EF 76-22%, grade 1 diastolic dysfunction  -Upper extremity duplex negative for DVT, but shows reactive lymph nodes  Sinus tachycardia/SIRS  -Improved with IV hydration  -Secondary to fever and possible autoimmune disease  -pt with low grade fever--improved after starting steroids -blood cultures--negative to date  Right wrist pain and swelling  -X-ray of right wrist--non acute  -Uric acid--6.7  -improving with steroids  Iron deficiency anemia  -Check iron studies--iron saturation undetectable  -Start iron supplementation  Family Communication: spouse updated at beside  Disposition Plan: Home when medically stable  Antibiotics:  clinda 8/19 x 1  zithromax 8/19>>8/21         Procedures/Studies: Ct Abdomen Pelvis Wo Contrast  02/13/2014   CLINICAL DATA:  Right flank pain and hematuria. Lower back pain. Bilateral hand and foot swelling for 3 days.  EXAM: CT ABDOMEN AND PELVIS WITHOUT CONTRAST  TECHNIQUE: Multidetector CT imaging of the abdomen and pelvis was performed following the standard protocol without IV contrast.  COMPARISON:  Pelvic ultrasound performed 12/25/2012  FINDINGS: Trace bilateral pleural effusions are seen, with mild interstitial prominence. This could reflect minimal interstitial edema.  The liver and spleen are unremarkable in appearance. The gallbladder is within normal limits. The pancreas and adrenal glands are unremarkable.  The kidneys are unremarkable in appearance. There is no evidence of hydronephrosis. No renal or ureteral stones are seen. No perinephric stranding is appreciated.  No free fluid is identified. The small bowel is unremarkable in appearance. The stomach is within normal limits. No acute vascular abnormalities are seen.  Mild soft tissue edema is noted along the lateral abdominal wall bilaterally.  The appendix is normal in  caliber and contains air, without evidence for appendicitis. The colon is unremarkable in appearance.  The bladder is  mildly distended and grossly unremarkable. The uterus is within normal limits. The ovaries are relatively symmetric. No suspicious adnexal masses are seen. A small right ovarian follicle is noted.  Scattered prominent bilateral inguinal nodes are seen, measuring up to 1.5 cm in short axis. These are of uncertain significance.  No acute osseous abnormalities are identified.  IMPRESSION: 1. No acute abnormalities seen to explain the patient's symptoms. 2. Trace bilateral pleural effusions, with mild interstitial prominence. This could reflect minimal pulmonary edema. 3. Mild soft tissue edema along the lateral abdominal wall bilaterally. 4. Scattered prominent bilateral inguinal nodes, measuring up to 1.5 cm in short axis. These are of uncertain significance, though the largest node, on the right side, has a relatively benign appearance.   Electronically Signed   By: Garald Balding M.D.   On: 02/13/2014 04:14   Dg Chest 2 View  03/09/2014   CLINICAL DATA:  Chest pain and difficulty swallowing.  EXAM: CHEST  2 VIEW  COMPARISON:  None.  FINDINGS: The cardiac silhouette, mediastinal and hilar contours are within normal limits. There are bilateral lower lobe infiltrates and right greater than left pleural effusions. Fairly significant bronchitic changes are also noted. The bony thorax is intact.  IMPRESSION: Extensive bronchitic changes and bibasilar infiltrates and small effusions, right greater than left.   Electronically Signed   By: Kalman Jewels M.D.   On: 03/09/2014 01:50   Dg Wrist 2 Views Right  03/09/2014   CLINICAL DATA:  Pain and swelling.  EXAM: RIGHT WRIST - 2 VIEW  COMPARISON:  None.  FINDINGS: There is no evidence of fracture or dislocation. There is no evidence of arthropathy or other focal bone abnormality. Soft tissues are unremarkable.  IMPRESSION: Negative.   Electronically  Signed   By: Misty Stanley M.D.   On: 03/09/2014 14:29   Ct Soft Tissue Neck W Contrast  03/09/2014   CLINICAL DATA:  Neck pain.  EXAM: CT NECK WITH CONTRAST  TECHNIQUE: Multidetector CT imaging of the neck was performed using the standard protocol following the bolus administration of intravenous contrast.  CONTRAST:  65m OMNIPAQUE IOHEXOL 300 MG/ML  SOLN  COMPARISON:  None.  FINDINGS: Low-density expansion of the retropharyngeal space extending from the level of the anterior ring of C1 to C4. There is no peripheral enhancement suggestive of abscess. No cavitation within the lateral retropharyngeal nodes. The adenoid tonsil is enlarged and hyper enhancing, but there is no notable enlargement of the palatine tonsils. Diffuse mild nodal enlargement symmetrically throughout the neck. There is also bilateral axillary lymphadenopathy which is partially visualized. Axillary lymphadenopathy is for a solitary pharyngeal infection.  Prominent size of the bilateral parotid glands, without definite inflammation. There are 2 punctate calcifications within the bilateral glands. No evidence of mass along the surfaces of the aerodigestive tract. Major cervical vessels are patent. The thyroid gland is unremarkable. There is mild inflammatory mucosal thickening within the bilateral maxillary sinuses.  There is a partially visualized layering right pleural effusion which is at least moderate volume to be visible.  IMPRESSION: 1. Retropharyngeal edema, usually related to complicated pharyngitis/tonsillitis. 2. Diffuse cervical lymphadenopathy which could be related to #1, although the presence of bilateral axillary adenopathy and a moderate right pleural effusion implicates a systemic illness the could be infectious, inflammatory, or lymphoproliferative.   Electronically Signed   By: JJorje GuildM.D.   On: 03/09/2014 02:42   Ct Chest W Contrast  03/09/2014   CLINICAL DATA:  Pleural effusions and adenopathy in the  chest   EXAM: CT CHEST WITH CONTRAST  TECHNIQUE: Multidetector CT imaging of the chest was performed during intravenous contrast administration.  CONTRAST:  4m OMNIPAQUE IOHEXOL 300 MG/ML  SOLN  COMPARISON:  Multiple exams, including 03/09/2014 and 02/13/2014  FINDINGS: Bilateral axillary and subpectoral adenopathy, index left axillary lymph node 1.5 cm on image 15 of series 2. Clustered supraclavicular lymph nodes, with a posterior node measuring 0.9 cm on image 6 of series 2.  Right upper paratracheal lymph node 0.7 cm in short axis. Multiple additional mediastinal lymph nodes are present. 9 mm left infrahilar lymph node. Rim calcified of right infrahilar lymph node.  Bilateral internal mammary adenopathy with a right internal mammary noted 0.8 cm on image 29 series 2.  Moderate to large right pleural effusion with a loculation anteriorly. Mild enhancement along the parietal pleura, especially anteriorly on the right near the loculation. Right epicardial lymph node 0.9 cm in short axis, image 41 series 2. Small left pleural effusion. Bilateral passive atelectasis.  Trace pericardial effusion.  I suspect a right glenohumeral joint effusion. No nodules or edema within the aerated lung.  IMPRESSION: 1. Abnormal increased number of lymph nodes and Mild to moderate abnormal enlargement of lymph nodes in the axillary, subpectoral, internal mammary, infrahilar, mediastinal, and epicardial chains could be due to diffuse reactive adenopathy or lymphatic malignancy. Tissue sampling is likely warranted. 2. Moderate right and small left pleural effusions, with suspected pleural enhancement especially anteriorly on the right where there is loculation of the pleural effusion. This raises suspicion that the right pleural effusion is exudative. 3. Trace pericardial effusion. 4. Suspected right glenohumeral joint effusion.   Electronically Signed   By: WSherryl BartersM.D.   On: 03/09/2014 15:01   Ct Angio Chest Pe W/cm &/or Wo  Cm  03/12/2014   CLINICAL DATA:  Hypoxemia, chest pain, present and lupus, adenopathy, effusions  EXAM: CT ANGIOGRAPHY CHEST WITH CONTRAST  TECHNIQUE: Multidetector CT imaging of the chest was performed using the standard protocol during bolus administration of intravenous contrast. Multiplanar CT image reconstructions and MIPs were obtained to evaluate the vascular anatomy.  CONTRAST:  828mOMNIPAQUE IOHEXOL 350 MG/ML SOLN  COMPARISON:  03/09/2014  FINDINGS: Pulmonary arteries appear patent. No significant filling defect or pulmonary embolus demonstrated by CTA. Intact thoracic aorta. Negative for aneurysm or dissection. Mild cardiomegaly. Slight enlarging pericardial effusion noted. Enlarging bilateral pleural effusions which is partially loculated on the right. Increased bilateral lower lobe compressive atelectasis/ consolidation. Lingula and right middle lobe streaky subsegmental atelectasis as well. Motion artifact throughout the upper lobes.  Bilateral axillary adenopathy. Postop changes in the right axilla, suspect recent lymph node resection.  Stable mildly prominent right pericardial lymph node inferiorly. Included upper abdomen demonstrates no acute process.  No acute osseous finding.  Review of the MIP images confirms the above findings.  IMPRESSION: Negative for significant acute pulmonary embolus by CTA.  Enlarging bilateral pleural effusions with increased lower lobe compressive atelectasis/ consolidation.  Additional lingula and right middle lobe subsegmental atelectasis.  Small pericardial effusion  Bilateral axillary adenopathy   Electronically Signed   By: TrDaryll Brod.D.   On: 03/12/2014 13:13   Dg Chest Port 1 View  03/10/2014   CLINICAL DATA:  Status post right thoracentesis.  EXAM: PORTABLE CHEST - 1 VIEW  COMPARISON:  CT chest and PA and lateral chest 03/09/2014.  FINDINGS: Right pleural effusion is decreased after thoracentesis. No pneumothorax is identified. Left pleural effusion seen  on the prior study  appears slightly increased. Bibasilar atelectasis is noted.  IMPRESSION: Negative for pneumothorax after right thoracentesis.  Some increase in a small left pleural effusion.   Electronically Signed   By: Inge Rise M.D.   On: 03/10/2014 12:51         Subjective: Patient denies fevers, chills, headache, chest pain, dyspnea, nausea, vomiting, diarrhea, abdominal pain, dysuria, hematuria   Objective: Filed Vitals:   03/13/14 2121 03/14/14 0357 03/14/14 0524 03/14/14 1352  BP: 122/79  115/75 135/84  Pulse: 86  75 84  Temp: 98 F (36.7 C)  97.8 F (36.6 C) 98.4 F (36.9 C)  TempSrc: Oral  Oral Oral  Resp: 16  17 18   Height:      Weight:  56.7 kg (125 lb)    SpO2: 95%  93% 95%    Intake/Output Summary (Last 24 hours) at 03/14/14 1904 Last data filed at 03/14/14 1808  Gross per 24 hour  Intake    908 ml  Output      0 ml  Net    908 ml   Weight change: -1.089 kg (-2 lb 6.4 oz) Exam:   General:  Pt is alert, follows commands appropriately, not in acute distress  HEENT: No icterus, No thrush, No neck mass, [place]/AT  Cardiovascular: RRR, S1/S2, no rubs,   Respiratory: Bibasilar crackles, right greater than left. No wheezing. Good air movement.  Abdomen: Soft/+BS, non tender, non distended, no guarding  Extremities: 2+UE and LE edema, No lymphangitis, No petechiae, No rashes, no synovitis  Data Reviewed: Basic Metabolic Panel:  Recent Labs Lab 03/10/14 0615 03/11/14 2354 03/12/14 0530 03/12/14 0803 03/13/14 0515 03/14/14 0536  NA 136* 133*  --  135* 134* 134*  K 4.2 4.1  --  4.7 4.5 4.2  CL 107 104  --  107 105 106  CO2 15* 17*  --  17* 18* 19  GLUCOSE 76 94  --  85 118* 162*  BUN 16 17  --  13 12 22   CREATININE 0.53 0.72  --  0.49* 0.44* 0.48*  CALCIUM 8.1* 7.3*  --  7.3* 7.7* 7.7*  MG  --  2.0 2.0  --   --   --    Liver Function Tests:  Recent Labs Lab 03/09/14 0011 03/09/14 0613 03/10/14 0615 03/10/14 1330 03/11/14 2354  03/12/14 0803  AST 27 19 18   --  15 28  ALT 15 12 12   --  8 9  ALKPHOS 138* 118* 114  --  82 94  BILITOT 0.4 0.3 0.3  --  0.3 0.3  PROT 8.1 7.4 7.5 7.6 6.6 6.8  ALBUMIN 3.0* 2.6* 2.5*  --  2.0* 2.0*    Recent Labs Lab 03/09/14 0011  LIPASE 33   No results found for this basename: AMMONIA,  in the last 168 hours CBC:  Recent Labs Lab 03/09/14 0613 03/10/14 0615 03/11/14 2354 03/12/14 1120 03/13/14 0515 03/14/14 0536  WBC 4.4 8.2 5.0 4.5 3.6* 3.5*  NEUTROABS 3.0 5.3 3.6  --   --   --   HGB 10.3* 10.3* 9.3* 8.7* 9.3* 9.2*  HCT 30.3* 30.5* 27.5* 25.8* 27.1* 26.8*  MCV 79.3 81.8 81.1 79.1 78.8 80.2  PLT 365 PLATELET CLUMPS NOTED ON SMEAR, UNABLE TO ESTIMATE 371 361 347 367   Cardiac Enzymes:  Recent Labs Lab 03/09/14 0613 03/09/14 1657 03/12/14 1120  TROPONINI <0.30 <0.30 <0.30   BNP: No components found with this basename: POCBNP,  CBG: No results found for this  basename: GLUCAP,  in the last 168 hours  Recent Results (from the past 240 hour(s))  RAPID STREP SCREEN     Status: None   Collection Time    03/08/14 11:20 PM      Result Value Ref Range Status   Streptococcus, Group A Screen (Direct) NEGATIVE  NEGATIVE Final   Comment: (NOTE)     A Rapid Antigen test may result negative if the antigen level in the     sample is below the detection level of this test. The FDA has not     cleared this test as a stand-alone test therefore the rapid antigen     negative result has reflexed to a Group A Strep culture.  CULTURE, GROUP A STREP     Status: None   Collection Time    03/08/14 11:20 PM      Result Value Ref Range Status   Specimen Description THROAT   Final   Special Requests CX ADDED AT 2343 ON [id number]   Final   Culture     Final   Value: No Beta Hemolytic Streptococci Isolated     Performed at [facility]   Report Status 03/10/2014 FINAL   Final  CULTURE, BLOOD (ROUTINE X 2)     Status: None   Collection Time    03/09/14  3:25 PM       Result Value Ref Range Status   Specimen Description BLOOD LEFT ANTECUBITAL   Final   Special Requests BOTTLES DRAWN AEROBIC AND ANAEROBIC 10CC   Final   Culture  Setup Time     Final   Value: 03/09/2014 21:23     Performed at [facility]   Culture     Final   Value:        BLOOD CULTURE RECEIVED NO GROWTH TO DATE CULTURE WILL BE HELD FOR 5 DAYS BEFORE ISSUING A FINAL NEGATIVE REPORT     Performed at [facility]   Report Status PENDING   Incomplete  CULTURE, BLOOD (ROUTINE X 2)     Status: None   Collection Time    03/09/14  3:40 PM      Result Value Ref Range Status   Specimen Description BLOOD RIGHT HAND   Final   Special Requests BOTTLES DRAWN AEROBIC AND ANAEROBIC 5CC   Final   Culture  Setup Time     Final   Value: 03/09/2014 21:22     Performed at [facility]   Culture     Final   Value:        BLOOD CULTURE RECEIVED NO GROWTH TO DATE CULTURE WILL BE HELD FOR 5 DAYS BEFORE ISSUING A FINAL NEGATIVE REPORT     Performed at [facility]   Report Status PENDING   Incomplete  AFB CULTURE WITH SMEAR     Status: None   Collection Time    03/10/14 12:06 PM      Result Value Ref Range Status   Specimen Description FLUID RIGHT PLEURAL   Final   Special Requests Normal   Final   Acid Fast Smear     Final   Value: NO ACID FAST BACILLI SEEN     Performed at [facility]   Culture     Final   Value: CULTURE WILL BE EXAMINED FOR 6 WEEKS BEFORE ISSUING A FINAL REPORT     Performed at [facility]   Report Status PENDING   Incomplete  BODY FLUID CULTURE     Status: None   Collection Time    03/10/14 12:06 PM      Result Value Ref Range Status   Specimen Description FLUID RIGHT PLEURAL   Final   Special Requests Normal   Final   Gram Stain     Final   Value: FEW WBC PRESENT,BOTH PMN AND MONONUCLEAR     NO ORGANISMS SEEN     Performed at [facility]   Culture     Final   Value: NO GROWTH 3 DAYS     Performed at [facility]   Report Status 03/13/2014 FINAL   Final  MRSA PCR SCREENING     Status: None   Collection Time    03/11/14 11:18 AM      Result Value Ref Range Status   MRSA by PCR NEGATIVE  NEGATIVE Final   Comment:            The GeneXpert MRSA Assay (FDA     approved for NASAL specimens     only), is one component of a     comprehensive MRSA colonization     surveillance program. It is not     intended to diagnose MRSA     infection nor to guide or     monitor treatment for     MRSA infections.  CULTURE, BLOOD (ROUTINE X 2)     Status: None   Collection Time    03/12/14 11:20 AM      Result Value Ref Range Status   Specimen Description BLOOD LEFT ARM   Final   Special Requests BOTTLES DRAWN AEROBIC AND ANAEROBIC 10CC   Final   Culture  Setup Time     Final   Value: 03/12/2014 20:00     Performed at [facility]   Culture     Final   Value:        BLOOD CULTURE RECEIVED NO GROWTH TO DATE CULTURE WILL BE HELD FOR 5 DAYS BEFORE ISSUING A FINAL NEGATIVE REPORT     Performed at [facility]   Report Status PENDING   Incomplete  CULTURE, BLOOD (ROUTINE X 2)     Status: None   Collection Time    03/12/14 11:28 AM      Result Value Ref Range Status   Specimen Description BLOOD RIGHT HAND   Final   Special Requests BOTTLES DRAWN AEROBIC AND ANAEROBIC 10CC   Final   Culture  Setup Time     Final   Value: 03/12/2014 20:00     Performed at [facility]   Culture     Final   Value:        BLOOD CULTURE RECEIVED NO GROWTH TO DATE CULTURE WILL BE HELD FOR 5 DAYS BEFORE ISSUING A FINAL NEGATIVE REPORT     Performed at [facility]   Report Status PENDING   Incomplete     Scheduled Meds: . enoxaparin (LOVENOX) injection  40 mg Subcutaneous Q24H  . ferrous sulfate  325 mg Oral BID WC  . methylPREDNISolone (SOLU-MEDROL) injection  250 mg Intravenous Q6H  . sodium bicarbonate  650 mg Oral BID  . sodium chloride  3 mL Intravenous Q12H   Continuous  Infusions:    Toran Murch, DO  [facility] Pager [phone]  If 7PM-7AM, please contact night-coverage [email or web address] Password [email or web address] 03/14/2014, 7:04 PM   LOS: 5 days

## 2014-03-15 LAB — BASIC METABOLIC PANEL
ANION GAP: 10 (ref 5–15)
BUN: 25 mg/dL — ABNORMAL HIGH (ref 6–23)
CHLORIDE: 104 meq/L (ref 96–112)
CO2: 19 meq/L (ref 19–32)
Calcium: 7.7 mg/dL — ABNORMAL LOW (ref 8.4–10.5)
Creatinine, Ser: 0.4 mg/dL — ABNORMAL LOW (ref 0.50–1.10)
GFR calc Af Amer: >90 mL/min (ref >90)
GFR calc non Af Amer: >90 mL/min (ref >90)
Glucose, Bld: 160 mg/dL — ABNORMAL HIGH (ref 70–99)
Potassium: 5 mEq/L (ref 3.7–5.3)
Sodium: 133 mEq/L — ABNORMAL LOW (ref 137–147)

## 2014-03-15 LAB — CULTURE, BLOOD (ROUTINE X 2)
Culture: NO GROWTH
Culture: NO GROWTH

## 2014-03-15 MED ORDER — PREDNISONE 20 MG PO TABS: 60.0000 mg | ORAL_TABLET | Freq: Every day | ORAL | Status: DC

## 2014-03-15 MED ORDER — OXYCODONE HCL 10 MG PO TABS: 10.0000 mg | ORAL_TABLET | ORAL | Status: DC | PRN

## 2014-03-15 MED ORDER — FERROUS SULFATE 325 (65 FE) MG PO TABS: 325.0000 mg | ORAL_TABLET | Freq: Two times a day (BID) | ORAL | Status: DC

## 2014-03-15 NOTE — Care Management Note (Signed)
    Page 1 of 1   03/15/2014     12:39:21 PM CARE MANAGEMENT NOTE 03/15/2014  Patient:  Wendy Kaufman, Wendy Kaufman   Account Number:  [id number]  Date Initiated:  03/15/2014  Documentation initiated by:  Letha Cape  Subjective/Objective Assessment:   dx lymphadenopathy, lupus  admit- lives with boyfriend.     Action/Plan:   Anticipated DC Date:  03/15/2014   Anticipated DC Plan:  HOME/SELF CARE      DC Planning Services  CM consult  [facility]  Medication Assistance      Choice offered to / List presented to:             Status of service:  Completed, signed off Medicare Important Message given?  NO (If response is "NO", the following Medicare IM given date fields will be blank) Date Medicare IM given:   Medicare IM given by:   Date Additional Medicare IM given:   Additional Medicare IM given by:    Discharge Disposition:  HOME/SELF CARE  Per UR Regulation:  Reviewed for med. necessity/level of care/duration of stay  If discussed at Long Length of Stay Meetings, dates discussed:    Comments:  03/15/14 1235 Letha Cape RN, BSN [phone] spanish interpreter on site.  NCM informed patient of her f/u hospital apt at the [facility] clinic and that she needs to go to [employer] to apply for emergency medicaid per financial counselor.  Patient is on prednison  #90 which is 25.49 at [facility], patient states she can afford this and will pay for this her self.  Patient has transportation to get home.  Patient did not want to use Match Program on this admission.

## 2014-03-15 NOTE — Progress Notes (Signed)
NURSING PROGRESS NOTE  Wendy Kaufman [id number] Discharge Data: 03/15/2014 3:16 PM Attending Provider: No att. providers found PCP:No primary provider on file.   Scharlene Corn Sanchez-Flores to be D/C'd Home per MD order.    All IV's will be discontinued and monitored for bleeding.  All belongings will be returned to patient for patient to take home.  Interpreter used for discharge instructions.   Last Documented Vital Signs:  Blood pressure 149/88, pulse 60, temperature 97.4 F (36.3 C), temperature source Oral, resp. rate 17, height 5' (1.524 m), weight 57.153 kg (126 lb), SpO2 96.00%, unknown if currently breastfeeding.  Madelin Rear, MSN, RN, [employer]

## 2014-03-15 NOTE — Discharge Summary (Signed)
Physician Discharge Summary  Wendy Kaufman:[id number] DOB: [birth year] DOA: 03/09/2014  PCP: No primary provider on file.  Admit date: 03/09/2014 Discharge date: 03/15/2014  Recommendations for Outpatient Follow-up:  1. Pt will need to follow up with PCP in 2 weeks post discharge 2. Please obtain BMP 1w 3. Please also check CBC 1w 4. Please follow up on pathology from lymph node excisional biopsy 5. Followup Dr. 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 (1:2560); Lupus Vasculitis -pt has lupus  -Obtain HIV antibody--neg  -HIV RNA-neg  -CT chest--bilateral axillary and subpectoral lymph nodes, moderate right-sided loculated pleural effusion, paratracheal, hilar, and mediastinal lymph nodes  -LDH is normal  -check C3, C4, CH50--all low  -anti-RNP,anti-SSA, anti-SSB--all positive  -dsDNA-2510, anti-Smith--positive  -esr--52  -CRP--6.7  -consulted surgery for excisional biopsy of LN--performed 03/11/14--awaiting pathology  -discussed with oncology--need whole lymph node to best make diagnosis of lymphoma  -await IGRA results--neg  -dc zithromax on 8/21  -03/12/14 evening-->started IV solumedrol  -The patient was started on Solu-Medrol 250 mg IV every 6 hours with significant clinical improvement and all of her symptoms -The patient received nearly 3 days of Solu-Medrol -She will be discharged home with prednisone 60 mg daily -Case was discussed with rheumatology, Dr. Unice Bailey who has kindly agreed to see the patient in the office after discharge The patient's fingertip lesions are likely due to her lupus vasculitis-RX oxycodone 10 mg, #30, no refills, one tablet every 4 hours when necessary pain  Fever    -secondary to lupus  -Improved with steroids  -Likely related to the patient's autoimmune disease  -Blood cultures x2 sets  -As the patient is hemodynamically stable presently, will remain off antibiotics  -Procalcitonin--0.22  Atypical chest pain  -Cycle troponins--neg  -EKG shows sinus tachycardia without any ST changes  -CT angiogram of the chest as discussed below  -03/14/14 repeat echo--unchanged pericardial effusion  Pleural effusion  -Appreciate pulmonary consult  -03/10/2014 thoracocentesis-700cc removed  -WBC 1550 (94% mono)  -fluid appears exudative--culture neg  -await cytology--neg  -ANA from pleural fluid is positive  Acute respiratory failure  -Patient developed tachycardia and hypoxemia on the evening of 03/11/2014  -CTA chest neg for PE but with b/l pleural effusion and increase pericardial effusion-->repeat echo to eval pericardial effusion  -Continue supplemental oxygen-->weaned to RA after starting steroids  Diffuse edema  -secondary to third spacing,?degree if lymphatic obstruction from lymphadenopathy  -Urine protein/creatinine ratio--0.52 not consistent with nephrotic syndrome  -Partly attributable to the patient's low albumin and third spacing  -echocardiogram--EF 24-26%, grade 1 diastolic dysfunction  -Upper extremity duplex negative for DVT, but shows reactive lymph nodes Remarkably, the patient's diffuse edema gradually improved with the initiation of intravenous steroids  Sinus tachycardia/SIRS  -Improved with IV hydration  and steroids  -Secondary to fever and possible autoimmune disease  -pt with low grade fever--improved after starting steroids  -blood cultures--negative to date  Right wrist pain and swelling  -X-ray of right wrist--non acute  -Uric acid--6.7  -improving with steroids  Iron deficiency anemia  -Check iron studies--iron saturation undetectable  -Start iron supplementation ferrous sulfate 325 mg twice a day -- Family Communication:  spouse updated at beside  Disposition Plan: Home when medically stable  Antibiotics:  clinda  8/19 x 1  zithromax 8/19>>8/21   Discharge Condition:  Stable Disposition:  Follow-up Information   Follow up with Tobie Lords, MD In 1 week.   Specialty:  Rheumatology   Contact information:   [place], [place] [address] [phone]       Follow up with Maia Petties., MD In 1 week.   Specialty:  General Surgery   Contact information:   [address] [place] [address] [phone]      home   Diet: Regular Wt Readings from Last 3 Encounters:  03/15/14 57.153 kg (126 lb)  03/15/14 57.153 kg (126 lb)  12/28/12 63.504 kg (140 lb)    History of present illness:  28 year old Hispanic female with known chronic medical problems. She presents with two-week history of increasing bilateral upper and lower extremity edema. She denied any fevers or chills at home. She denies any recent travels. She was born in [place], but has been in [place] for 9 years. In addition, the patient complains of one to two-day history of sore throat, but she is able to swallow her saliva, although she states that there is some pain with swallowing. She denies any headache, chest discomfort, shortness breath, coughing, hemoptysis, abdominal pain, dysuria, hematuria. She denies any previous history of sexual transmitted diseases or vaginal discharge. CT of the neck revealed hyper enhancing enlarged adenoids with retropharyngeal edema with bilateral prominent parotids.  Initially, the patient was started on azithromycin due to concerns of pharyngitis and tonsillitis. Further workup including CT of the neck as well as CT of the chest revealed diffuse cervical, supraclavicular, mediastinal, hilar adenopathy. CT of the abdomen and pelvis prior to admission also revealed that the patient had bilateral inguinal adenopathy. The patient had a loculated right pleural  effusion. Thoracentesis was performed and revealed that the pleural effusion was exudative. Cultures were negative. HIV antibody and HIV RNA were negative.  IGRA was negative. Lymph node biopsy was performed by general surgery. An excisional biopsy was performed from the right axilla on 03/11/2014. The pathologic results are still pending at the time of discharge. The patient's ANA was noted to be 1:2560. She had numerous elevated autoantibodies including double-stranded DNA, anti-Smith, anti-SSA, anti-SSB, anti-RNP. Her C3, C4, and CH 50 were suppressed. The patient was started on intravenous Solu-Medrol with excellent clinical response. Her fever and tachycardia significantly improved. The patient will be discharged with prednisone 60 mg daily. This case was discussed with rheumatology, Dr. Unice Bailey who has kindly agreed to see the patient in the office setting after discharge.   Discharge Exam: Filed Vitals:   03/15/14 0616  BP: 149/88  Pulse: 60  Temp: 97.4 F (36.3 C)  Resp: 17   Filed Vitals:   03/14/14 1352 03/14/14 2121 03/15/14 0156 03/15/14 0616  BP: 135/84 142/81  149/88  Pulse: 84 57  60  Temp: 98.4 F (36.9 C) 97.8 F (36.6 C)  97.4 F (36.3 C)  TempSrc: Oral Oral  Oral  Resp: 18 18  17   Height:      Weight:   57.153 kg (126 lb)   SpO2: 95% 96%  96%   General: A&O x 3, NAD, pleasant, cooperative Cardiovascular: RRR, no rub, no gallop, no S3 Respiratory: Bibasilar crackles but no wheezing. Good air movement. Abdomen:soft, nontender, nondistended, positive bowel sounds Extremities: 1+ LE edema, No lymphangitis, no petechiae; right axilla without erythema or drainage  Discharge Instructions      Discharge Instructions  Diet - low sodium heart healthy    Complete by:  As directed      Increase activity slowly    Complete by:  As directed             Medication List         ferrous sulfate 325 (65 FE) MG tablet  Take 1 tablet (325 mg total) by mouth 2  (two) times daily with a meal.     Oxycodone HCl 10 MG Tabs  Take 1 tablet (10 mg total) by mouth every 4 (four) hours as needed for severe pain.     predniSONE 20 MG tablet  Commonly known as:  DELTASONE  Take 3 tablets (60 mg total) by mouth daily with breakfast.  Start taking on:  03/16/2014         The results of significant diagnostics from this hospitalization (including imaging, microbiology, ancillary and laboratory) are listed below for reference.    Significant Diagnostic Studies: Dg Chest 2 View  03/09/2014   CLINICAL DATA:  Chest pain and difficulty swallowing.  EXAM: CHEST  2 VIEW  COMPARISON:  None.  FINDINGS: The cardiac silhouette, mediastinal and hilar contours are within normal limits. There are bilateral lower lobe infiltrates and right greater than left pleural effusions. Fairly significant bronchitic changes are also noted. The bony thorax is intact.  IMPRESSION: Extensive bronchitic changes and bibasilar infiltrates and small effusions, right greater than left.   Electronically Signed   By: Kalman Jewels M.D.   On: 03/09/2014 01:50   Dg Wrist 2 Views Right  03/09/2014   CLINICAL DATA:  Pain and swelling.  EXAM: RIGHT WRIST - 2 VIEW  COMPARISON:  None.  FINDINGS: There is no evidence of fracture or dislocation. There is no evidence of arthropathy or other focal bone abnormality. Soft tissues are unremarkable.  IMPRESSION: Negative.   Electronically Signed   By: Misty Stanley M.D.   On: 03/09/2014 14:29   Ct Soft Tissue Neck W Contrast  03/09/2014   CLINICAL DATA:  Neck pain.  EXAM: CT NECK WITH CONTRAST  TECHNIQUE: Multidetector CT imaging of the neck was performed using the standard protocol following the bolus administration of intravenous contrast.  CONTRAST:  7m OMNIPAQUE IOHEXOL 300 MG/ML  SOLN  COMPARISON:  None.  FINDINGS: Low-density expansion of the retropharyngeal space extending from the level of the anterior ring of C1 to C4. There is no peripheral  enhancement suggestive of abscess. No cavitation within the lateral retropharyngeal nodes. The adenoid tonsil is enlarged and hyper enhancing, but there is no notable enlargement of the palatine tonsils. Diffuse mild nodal enlargement symmetrically throughout the neck. There is also bilateral axillary lymphadenopathy which is partially visualized. Axillary lymphadenopathy is for a solitary pharyngeal infection.  Prominent size of the bilateral parotid glands, without definite inflammation. There are 2 punctate calcifications within the bilateral glands. No evidence of mass along the surfaces of the aerodigestive tract. Major cervical vessels are patent. The thyroid gland is unremarkable. There is mild inflammatory mucosal thickening within the bilateral maxillary sinuses.  There is a partially visualized layering right pleural effusion which is at least moderate volume to be visible.  IMPRESSION: 1. Retropharyngeal edema, usually related to complicated pharyngitis/tonsillitis. 2. Diffuse cervical lymphadenopathy which could be related to #1, although the presence of bilateral axillary adenopathy and a moderate right pleural effusion implicates a systemic illness the could be infectious, inflammatory, or lymphoproliferative.   Electronically Signed   By: JGilford SilviusD.  On: 03/09/2014 02:42   Ct Chest W Contrast  03/09/2014   CLINICAL DATA:  Pleural effusions and adenopathy in the chest  EXAM: CT CHEST WITH CONTRAST  TECHNIQUE: Multidetector CT imaging of the chest was performed during intravenous contrast administration.  CONTRAST:  21m OMNIPAQUE IOHEXOL 300 MG/ML  SOLN  COMPARISON:  Multiple exams, including 03/09/2014 and 02/13/2014  FINDINGS: Bilateral axillary and subpectoral adenopathy, index left axillary lymph node 1.5 cm on image 15 of series 2. Clustered supraclavicular lymph nodes, with a posterior node measuring 0.9 cm on image 6 of series 2.  Right upper paratracheal lymph node 0.7 cm in short  axis. Multiple additional mediastinal lymph nodes are present. 9 mm left infrahilar lymph node. Rim calcified of right infrahilar lymph node.  Bilateral internal mammary adenopathy with a right internal mammary noted 0.8 cm on image 29 series 2.  Moderate to large right pleural effusion with a loculation anteriorly. Mild enhancement along the parietal pleura, especially anteriorly on the right near the loculation. Right epicardial lymph node 0.9 cm in short axis, image 41 series 2. Small left pleural effusion. Bilateral passive atelectasis.  Trace pericardial effusion.  I suspect a right glenohumeral joint effusion. No nodules or edema within the aerated lung.  IMPRESSION: 1. Abnormal increased number of lymph nodes and Mild to moderate abnormal enlargement of lymph nodes in the axillary, subpectoral, internal mammary, infrahilar, mediastinal, and epicardial chains could be due to diffuse reactive adenopathy or lymphatic malignancy. Tissue sampling is likely warranted. 2. Moderate right and small left pleural effusions, with suspected pleural enhancement especially anteriorly on the right where there is loculation of the pleural effusion. This raises suspicion that the right pleural effusion is exudative. 3. Trace pericardial effusion. 4. Suspected right glenohumeral joint effusion.   Electronically Signed   By: WSherryl BartersM.D.   On: 03/09/2014 15:01   Ct Angio Chest Pe W/cm &/or Wo Cm  03/12/2014   CLINICAL DATA:  Hypoxemia, chest pain, present and lupus, adenopathy, effusions  EXAM: CT ANGIOGRAPHY CHEST WITH CONTRAST  TECHNIQUE: Multidetector CT imaging of the chest was performed using the standard protocol during bolus administration of intravenous contrast. Multiplanar CT image reconstructions and MIPs were obtained to evaluate the vascular anatomy.  CONTRAST:  849mOMNIPAQUE IOHEXOL 350 MG/ML SOLN  COMPARISON:  03/09/2014  FINDINGS: Pulmonary arteries appear patent. No significant filling defect or  pulmonary embolus demonstrated by CTA. Intact thoracic aorta. Negative for aneurysm or dissection. Mild cardiomegaly. Slight enlarging pericardial effusion noted. Enlarging bilateral pleural effusions which is partially loculated on the right. Increased bilateral lower lobe compressive atelectasis/ consolidation. Lingula and right middle lobe streaky subsegmental atelectasis as well. Motion artifact throughout the upper lobes.  Bilateral axillary adenopathy. Postop changes in the right axilla, suspect recent lymph node resection.  Stable mildly prominent right pericardial lymph node inferiorly. Included upper abdomen demonstrates no acute process.  No acute osseous finding.  Review of the MIP images confirms the above findings.  IMPRESSION: Negative for significant acute pulmonary embolus by CTA.  Enlarging bilateral pleural effusions with increased lower lobe compressive atelectasis/ consolidation.  Additional lingula and right middle lobe subsegmental atelectasis.  Small pericardial effusion  Bilateral axillary adenopathy   Electronically Signed   By: TrDaryll Brod.D.   On: 03/12/2014 13:13   Dg Chest Port 1 View  03/10/2014   CLINICAL DATA:  Status post right thoracentesis.  EXAM: PORTABLE CHEST - 1 VIEW  COMPARISON:  CT chest and PA and lateral chest 03/09/2014.  FINDINGS: Right pleural effusion is decreased after thoracentesis. No pneumothorax is identified. Left pleural effusion seen on the prior study appears slightly increased. Bibasilar atelectasis is noted.  IMPRESSION: Negative for pneumothorax after right thoracentesis.  Some increase in a small left pleural effusion.   Electronically Signed   By: Inge Rise M.D.   On: 03/10/2014 12:51     Microbiology: Recent Results (from the past 240 hour(s))  RAPID STREP SCREEN     Status: None   Collection Time    03/08/14 11:20 PM      Result Value Ref Range Status   Streptococcus, Group A Screen (Direct) NEGATIVE  NEGATIVE Final   Comment:  (NOTE)     A Rapid Antigen test may result negative if the antigen level in the     sample is below the detection level of this test. The FDA has not     cleared this test as a stand-alone test therefore the rapid antigen     negative result has reflexed to a Group A Strep culture.  CULTURE, GROUP A STREP     Status: None   Collection Time    03/08/14 11:20 PM      Result Value Ref Range Status   Specimen Description THROAT   Final   Special Requests CX ADDED AT 2343 ON [id number]   Final   Culture     Final   Value: No Beta Hemolytic Streptococci Isolated     Performed at [facility]   Report Status 03/10/2014 FINAL   Final  CULTURE, BLOOD (ROUTINE X 2)     Status: None   Collection Time    03/09/14  3:25 PM      Result Value Ref Range Status   Specimen Description BLOOD LEFT ANTECUBITAL   Final   Special Requests BOTTLES DRAWN AEROBIC AND ANAEROBIC 10CC   Final   Culture  Setup Time     Final   Value: 03/09/2014 21:23     Performed at [facility]   Culture     Final   Value: NO GROWTH 5 DAYS     Performed at [facility]   Report Status 03/15/2014 FINAL   Final  CULTURE, BLOOD (ROUTINE X 2)     Status: None   Collection Time    03/09/14  3:40 PM      Result Value Ref Range Status   Specimen Description BLOOD RIGHT HAND   Final   Special Requests BOTTLES DRAWN AEROBIC AND ANAEROBIC 5CC   Final   Culture  Setup Time     Final   Value: 03/09/2014 21:22     Performed at [facility]   Culture     Final   Value: NO GROWTH 5 DAYS     Performed at [facility]   Report Status 03/15/2014 FINAL   Final  AFB CULTURE WITH SMEAR     Status: None   Collection Time    03/10/14 12:06 PM      Result Value Ref Range Status   Specimen Description FLUID RIGHT PLEURAL   Final   Special Requests Normal   Final   Acid Fast Smear     Final   Value: NO ACID FAST BACILLI SEEN     Performed at [facility]   Culture     Final   Value: CULTURE  WILL BE EXAMINED FOR 6 WEEKS BEFORE ISSUING A FINAL REPORT     Performed  at [facility]   Report Status PENDING   Incomplete  BODY FLUID CULTURE     Status: None   Collection Time    03/10/14 12:06 PM      Result Value Ref Range Status   Specimen Description FLUID RIGHT PLEURAL   Final   Special Requests Normal   Final   Gram Stain     Final   Value: FEW WBC PRESENT,BOTH PMN AND MONONUCLEAR     NO ORGANISMS SEEN     Performed at [facility]   Culture     Final   Value: NO GROWTH 3 DAYS     Performed at [facility]   Report Status 03/13/2014 FINAL   Final  MRSA PCR SCREENING     Status: None   Collection Time    03/11/14 11:18 AM      Result Value Ref Range Status   MRSA by PCR NEGATIVE  NEGATIVE Final   Comment:            The GeneXpert MRSA Assay (FDA     approved for NASAL specimens     only), is one component of a     comprehensive MRSA colonization     surveillance program. It is not     intended to diagnose MRSA     infection nor to guide or     monitor treatment for     MRSA infections.  CULTURE, BLOOD (ROUTINE X 2)     Status: None   Collection Time    03/12/14 11:20 AM      Result Value Ref Range Status   Specimen Description BLOOD LEFT ARM   Final   Special Requests BOTTLES DRAWN AEROBIC AND ANAEROBIC 10CC   Final   Culture  Setup Time     Final   Value: 03/12/2014 20:00     Performed at [facility]   Culture     Final   Value:        BLOOD CULTURE RECEIVED NO GROWTH TO DATE CULTURE WILL BE HELD FOR 5 DAYS BEFORE ISSUING A FINAL NEGATIVE REPORT     Performed at [facility]   Report Status PENDING   Incomplete  CULTURE, BLOOD (ROUTINE X 2)     Status: None   Collection Time    03/12/14 11:28 AM      Result Value Ref Range Status   Specimen Description BLOOD RIGHT HAND   Final   Special Requests BOTTLES DRAWN AEROBIC AND ANAEROBIC 10CC   Final   Culture  Setup Time     Final   Value: 03/12/2014 20:00      Performed at [facility]   Culture     Final   Value:        BLOOD CULTURE RECEIVED NO GROWTH TO DATE CULTURE WILL BE HELD FOR 5 DAYS BEFORE ISSUING A FINAL NEGATIVE REPORT     Performed at [facility]   Report Status PENDING   Incomplete     Labs: Basic Metabolic Panel:  Recent Labs Lab 03/10/14 0615 03/11/14 2354 03/12/14 0530 03/12/14 0803 03/13/14 0515 03/14/14 0536 03/15/14 0812  NA 136* 133*  --  135* 134* 134* 133*  K 4.2 4.1  --  4.7 4.5 4.2 5.0  CL 107 104  --  107 105 106 104  CO2 15* 17*  --  17* 18* 19 19  GLUCOSE 76 94  --  85 118* 162*  160*  BUN 16 17  --  13 12 22  25*  CREATININE 0.53 0.72  --  0.49* 0.44* 0.48* 0.40*  CALCIUM 8.1* 7.3*  --  7.3* 7.7* 7.7* 7.7*  MG  --  2.0 2.0  --   --   --   --    Liver Function Tests:  Recent Labs Lab 03/09/14 0011 03/09/14 0613 03/10/14 0615 03/10/14 1330 03/11/14 2354 03/12/14 0803  AST 27 19 18   --  15 28  ALT 15 12 12   --  8 9  ALKPHOS 138* 118* 114  --  82 94  BILITOT 0.4 0.3 0.3  --  0.3 0.3  PROT 8.1 7.4 7.5 7.6 6.6 6.8  ALBUMIN 3.0* 2.6* 2.5*  --  2.0* 2.0*    Recent Labs Lab 03/09/14 0011  LIPASE 33   No results found for this basename: AMMONIA,  in the last 168 hours CBC:  Recent Labs Lab 03/09/14 0613 03/10/14 0615 03/11/14 2354 03/12/14 1120 03/13/14 0515 03/14/14 0536  WBC 4.4 8.2 5.0 4.5 3.6* 3.5*  NEUTROABS 3.0 5.3 3.6  --   --   --   HGB 10.3* 10.3* 9.3* 8.7* 9.3* 9.2*  HCT 30.3* 30.5* 27.5* 25.8* 27.1* 26.8*  MCV 79.3 81.8 81.1 79.1 78.8 80.2  PLT 365 PLATELET CLUMPS NOTED ON SMEAR, UNABLE TO ESTIMATE 371 361 347 367   Cardiac Enzymes:  Recent Labs Lab 03/09/14 0613 03/09/14 1657 03/12/14 1120  TROPONINI <0.30 <0.30 <0.30   BNP: No components found with this basename: POCBNP,  CBG: No results found for this basename: GLUCAP,  in the last 168 hours  Time coordinating discharge:  Greater than 30 minutes  Signed:  Gailen Venne, DO [facility] Pager: [phone] 03/15/2014, 11:02 AM

## 2014-03-16 ENCOUNTER — Ambulatory Visit (HOSPITAL_COMMUNITY)
Admission: RE | Admit: 2014-03-16 | Discharge: 2014-03-16 | Disposition: A | Discharge status: Home / Self Care | Payer: Medicaid Other | Source: Ambulatory Visit | Attending: Internal Medicine | Admitting: Internal Medicine

## 2014-03-16 ENCOUNTER — Ambulatory Visit: Payer: Self-pay | Attending: Internal Medicine | Admitting: Internal Medicine

## 2014-03-16 VITALS — BP 130/68 | HR 53 | Temp 98.2°F | Resp 20 | Ht 60.63 in | Wt 130.2 lb

## 2014-03-16 DIAGNOSIS — J9819 Other pulmonary collapse: Secondary | ICD-10-CM | POA: Insufficient documentation

## 2014-03-16 DIAGNOSIS — R599 Enlarged lymph nodes, unspecified: Secondary | ICD-10-CM

## 2014-03-16 DIAGNOSIS — J90 Pleural effusion, not elsewhere classified: Secondary | ICD-10-CM | POA: Insufficient documentation

## 2014-03-16 DIAGNOSIS — R0602 Shortness of breath: Secondary | ICD-10-CM

## 2014-03-16 DIAGNOSIS — R591 Generalized enlarged lymph nodes: Secondary | ICD-10-CM

## 2014-03-16 DIAGNOSIS — R071 Chest pain on breathing: Secondary | ICD-10-CM | POA: Insufficient documentation

## 2014-03-16 DIAGNOSIS — D509 Iron deficiency anemia, unspecified: Secondary | ICD-10-CM | POA: Insufficient documentation

## 2014-03-16 DIAGNOSIS — I776 Arteritis, unspecified: Secondary | ICD-10-CM | POA: Insufficient documentation

## 2014-03-16 DIAGNOSIS — M329 Systemic lupus erythematosus, unspecified: Secondary | ICD-10-CM | POA: Insufficient documentation

## 2014-03-16 MED ORDER — FUROSEMIDE 20 MG PO TABS: 20.0000 mg | ORAL_TABLET | Freq: Every day | ORAL | Status: DC

## 2014-03-16 MED ORDER — ENSURE COMPLETE PO LIQD: 237.0000 mL | Freq: Two times a day (BID) | ORAL | Status: DC

## 2014-03-16 NOTE — Progress Notes (Signed)
Patient presents to establish care HFU for newly diagnosed lupus States they removed fluid from her lungs 1 week ago and  this AM woke with pain at site that increases with inspiration

## 2014-03-16 NOTE — Progress Notes (Signed)
Patient ID: Wendy Kaufman, female   DOB: 1986-06-26, 28 y.o.   MRN: [id number]   CC:  HPI: 28 year old Hispanic female with known chronic medical problems. She presents with two-week history of increasing bilateral upper and lower extremity edema.   She was born in [place], but has been in [place] for 9 years. In addition, the patient complains of one to two-day history of sore throat, but she is able to swallow her saliva, although she states that there is some pain with swallowing.   CT of the neck revealed hyper enhancing enlarged adenoids with retropharyngeal edema with bilateral prominent parotids.  Initially, the patient was started on azithromycin due to concerns of pharyngitis and tonsillitis. Further workup including CT of the neck as well as CT of the chest revealed diffuse cervical, supraclavicular, mediastinal, hilar adenopathy.  CT of the abdomen and pelvis prior to admission also revealed that the patient had bilateral inguinal adenopathy. The patient had a loculated right pleural effusion. Thoracentesis was performed and revealed that the pleural effusion was exudative.   Cultures were negative. HIV antibody and HIV RNA were negative. IGRA was negative. Lymph node biopsy was performed by general surgery. An excisional biopsy was performed from the right axilla on 03/11/2014. The pathologic results show florid lymphoid hyperplasia considered to be a reactive process, flow cytometry was negative. The patient's ANA was noted to be 1:2560. She had numerous elevated autoantibodies including double-stranded DNA, anti-Smith, anti-SSA, anti-SSB, anti-RNP. Her C3, C4, and CH 50 were suppressed.   The patient was started on intravenous Solu-Medrol 250 mg IV every 6 hours with excellent clinical response. Her fever and tachycardia significantly improved. Patient was discharged home on prednisone 60 mg daily. She is yet to schedule her appointment with rheumatology, Dr. Lurena Nida. Patient [facility] for routine hospital followup. She endorses right-sided chest pain at the site of a thoracentesis. She endorses pain when she coughs but denies any shortness of breath or lightheadedness. Denies any chest pain in the substernal area. Denies any pleuritic symptoms of chest pain. She denies any fever. She still has some bilateral lower extremity edema which is improving. She has a good appetite.    No Known Allergies Past Medical History  Diagnosis Date  . Medical history non-contributory   . Lupus 03/09/14   Current Outpatient Prescriptions on File Prior to Visit  Medication Sig Dispense Refill  . ferrous sulfate 325 (65 FE) MG tablet Take 1 tablet (325 mg total) by mouth 2 (two) times daily with a meal.  60 tablet  3  . oxyCODONE 10 MG TABS Take 1 tablet (10 mg total) by mouth every 4 (four) hours as needed for severe pain.  30 tablet  0  . predniSONE (DELTASONE) 20 MG tablet Take 3 tablets (60 mg total) by mouth daily with breakfast.  90 tablet  0   No current facility-administered medications on file prior to visit.   History reviewed. No pertinent family history. History   Social History  . Marital Status: Married    Spouse Name: N/A    Number of Children: N/A  . Years of Education: N/A   Occupational History  . Not on file.   Social History Main Topics  . Smoking status: Never Smoker   . Smokeless tobacco: Never Used  . Alcohol Use: No  . Drug Use: No  . Sexual Activity: Yes   Other Topics Concern  . Not on file   Social  History Narrative  . No narrative on file    Review of Systems  Constitutional: Negative for fever, chills, diaphoresis, activity change, appetite change and fatigue.  HENT: Negative for ear pain, nosebleeds, congestion, facial swelling, rhinorrhea, neck pain, neck stiffness and ear discharge.   Eyes: Negative for pain, discharge, redness, itching and visual disturbance.   Respiratory: Negative for cough, choking, right-sided pleuritic chest pain, shortness of breath, wheezing and stridor.   Cardiovascular: Negative for chest pain, palpitations and leg swelling.  Gastrointestinal: Negative for abdominal distention.  Genitourinary: Negative for dysuria, urgency, frequency, hematuria, flank pain, decreased urine volume, difficulty urinating and dyspareunia.  Musculoskeletal: Negative for back pain, joint swelling, arthralgias and gait problem.  Neurological: Negative for dizziness, tremors, seizures, syncope, facial asymmetry, speech difficulty, weakness, light-headedness, numbness and headaches.  Hematological: Negative for adenopathy. Does not bruise/bleed easily.  Psychiatric/Behavioral: Negative for hallucinations, behavioral problems, confusion, dysphoric mood, decreased concentration and agitation.    Objective:   Filed Vitals:   03/16/14 1117  BP: 130/68  Pulse: 53  Temp: 98.2 F (36.8 C)  Resp: 20    Physical Exam  Constitutional: Appears well-developed and well-nourished. No distress.  HENT: Normocephalic. External right and left ear normal. Oropharynx is clear and moist.  Eyes: Conjunctivae and EOM are normal. PERRLA, no scleral icterus.  Neck: Normal ROM. Neck supple. No JVD. No tracheal deviation. No thyromegaly.  CVS: RRR, S1/S2 +, no murmurs, no gallops, no carotid bruit.  Pulmonary: Chest wall examination of the area thoracentesis shows, no cellulitis or signs of infection around the point of entry, no stridor, rhonchi, wheezes, rales.  Abdominal: Soft. BS +,  no distension, tenderness, rebound or guarding.  Musculoskeletal: Normal range of motion. No edema and no tenderness.  Lymphadenopathy: No lymphadenopathy noted, cervical, inguinal. Neuro: Alert. Normal reflexes, muscle tone coordination. No cranial nerve deficit. Skin: Skin is warm and dry. No rash noted. Not diaphoretic. No erythema. No pallor.  Psychiatric: Normal mood and  affect. Behavior, judgment, thought content normal.   Lab Results  Component Value Date   WBC 3.5* 03/14/2014   HGB 9.2* 03/14/2014   HCT 26.8* 03/14/2014   MCV 80.2 03/14/2014   PLT 367 03/14/2014   Lab Results  Component Value Date   CREATININE 0.40* 03/15/2014   BUN 25* 03/15/2014   NA 133* 03/15/2014   K 5.0 03/15/2014   CL 104 03/15/2014   CO2 19 03/15/2014    No results found for this basename: HGBA1C   Lipid Panel     Component Value Date/Time   CHOL 92 03/10/2014 1330       Assessment and plan:   Patient Active Problem List   Diagnosis Date Noted  . 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 Pleural effusion on right 03/10/2014  . Anemia, iron deficiency 03/10/2014  . Edema 03/09/2014  . Tonsillitis 03/09/2014  . Anemia 03/09/2014  . SIRS (systemic inflammatory response syndrome) 03/09/2014  . Lymphadenopathy 03/09/2014  . Active labor 12/28/2012  . Normal delivery 12/28/2012   Diffuse adenopathy with positive ANA (1:2560); Lupus Vasculitis anti-RNP,anti-SSA, anti-SSB--all positive  -dsDNA-2510, anti-Smith--positive  I have provided her the information for Dr. Lurena Nida again on her discharge paperwork Patient understands that she needs to set up this appointment Pathology from the biopsy revealed and appears to be a reactive process with negative flow cytometry No underlying signs of fever or infection, hemodynamically stable today  Right-sided chest wall pain Patient is status post thoracentesis 700 cc removed Fluid appeared to be exudative  No associated symptoms of shortness of breath, low suspicion for PE clinically We'll repeat a chest x-ray today to rule out reaccumulation of the pleural fluid Patient had a CT angiogram on 8/22 and was negative for PE   Bilateral lower extremity edema This is secondary to hypoalbuminemia Urine protein/creatinine ratio--0.52 not consistent with nephrotic syndrome  -Partly attributable to the patient's low albumin and third  spacing  -echocardiogram--EF 60-65%, grade 1 diastolic dysfunction  Recommended Ensure to build up her nutritional status Also started the patient on low-dose Lasix We will repeat a CMP panel on 9/4  Iron deficiency anemia Patient is currently on ferrous sulfate   Followup we will see the patient back in 2 weeks She has been provided with our contact information and has been instructed to call the clinic back for any acute issues such as increasing shortness of breath, fever, worsening chest wall pain       The patient was given clear instructions to go to ER or return to medical center if symptoms don't improve, worsen or new problems develop. The patient verbalized understanding. The patient was told to call to get any lab results if not heard anything in the next week.

## 2014-03-16 NOTE — Patient Instructions (Signed)
Please call Dr.Anthony S. Dareen Piano, MD Main Numbers: [phone] Fax: [phone] If you need to speak with a doctor/nurse after office hours for an urgent medical need, please call: [phone] Please know that prescription refills can only be handled during office hours when we can access your chart. Office Hours:  Monday to Thursday  8:00AM to 5:00PM Friday 8:00 AM to 12:00 PM

## 2014-03-17 LAB — EPSTEIN BARR VRS(EBV DNA BY PCR): EBV DNA QN by PCR: <200 copies/mL

## 2014-03-17 LAB — CMV (CYTOMEGALOVIRUS) DNA ULTRAQUANT, PCR: CMV DNA Quant: <200 copies/mL

## 2014-03-18 LAB — CULTURE, BLOOD (ROUTINE X 2)
Culture: NO GROWTH
Culture: NO GROWTH

## 2014-03-18 LAB — RHEUMATOID FACTORS, FLUID: Cortisol #1 (Base): NEGATIVE

## 2014-03-21 ENCOUNTER — Encounter (INDEPENDENT_AMBULATORY_CARE_PROVIDER_SITE_OTHER): Payer: Self-pay | Admitting: Surgery

## 2014-03-30 ENCOUNTER — Ambulatory Visit: Payer: Self-pay | Attending: Internal Medicine | Admitting: Internal Medicine

## 2014-03-30 ENCOUNTER — Encounter: Payer: Self-pay | Admitting: Internal Medicine

## 2014-03-30 VITALS — BP 159/92 | HR 66 | Temp 98.3°F | Resp 16 | Ht 60.5 in | Wt 124.0 lb

## 2014-03-30 DIAGNOSIS — I776 Arteritis, unspecified: Secondary | ICD-10-CM

## 2014-03-30 DIAGNOSIS — M3219 Other organ or system involvement in systemic lupus erythematosus: Secondary | ICD-10-CM

## 2014-03-30 DIAGNOSIS — R609 Edema, unspecified: Secondary | ICD-10-CM | POA: Insufficient documentation

## 2014-03-30 DIAGNOSIS — M2550 Pain in unspecified joint: Secondary | ICD-10-CM | POA: Insufficient documentation

## 2014-03-30 DIAGNOSIS — D638 Anemia in other chronic diseases classified elsewhere: Secondary | ICD-10-CM

## 2014-03-30 DIAGNOSIS — D649 Anemia, unspecified: Secondary | ICD-10-CM | POA: Insufficient documentation

## 2014-03-30 DIAGNOSIS — Z2821 Immunization not carried out because of patient refusal: Secondary | ICD-10-CM

## 2014-03-30 DIAGNOSIS — M329 Systemic lupus erythematosus, unspecified: Secondary | ICD-10-CM | POA: Insufficient documentation

## 2014-03-30 MED ORDER — TRAMADOL HCL 50 MG PO TABS: 50.0000 mg | ORAL_TABLET | Freq: Three times a day (TID) | ORAL | Status: DC | PRN

## 2014-03-30 NOTE — Progress Notes (Signed)
Pt is here saying that she is having joint pain since yesterday. Pt is following up on her lymphadenopathy.

## 2014-03-30 NOTE — Progress Notes (Signed)
Patient ID: Wendy Kaufman, female   DOB: 1986-01-12, 28 y.o.   MRN: [id number]  CC:  Edema, joint pain  HPI:  Patient presents today with a history of lupus vasculitis. She reports that she has BLE edema, joint pain, and cough for one month. She is currently on ferrous sulfate, lasix, and prednisone.  Because the directions were written in english and she did not understand she has only been taking 20 mg of prednisone daily instead of 60 mg.  She believes that her shortness of breath has not improved but she has noticed weight loss.  The SOB is aggravated by lying flat on his back.  She reportedly has a Rheumatology appointment next week on the 14th.  She denies fevers but endorses chills.    No Known Allergies Past Medical History  Diagnosis Date  . Medical history non-contributory   . Lupus 03/09/14   Current Outpatient Prescriptions on File Prior to Visit  Medication Sig Dispense Refill  . ferrous sulfate 325 (65 FE) MG tablet Take 1 tablet (325 mg total) by mouth 2 (two) times daily with a meal.  60 tablet  3  . predniSONE (DELTASONE) 20 MG tablet Take 3 tablets (60 mg total) by mouth daily with breakfast.  90 tablet  0  . feeding supplement, ENSURE COMPLETE, (ENSURE COMPLETE) LIQD Take 237 mLs by mouth 2 (two) times daily between meals.  100 Bottle  0  . furosemide (LASIX) 20 MG tablet Take 1 tablet (20 mg total) by mouth daily.  30 tablet  3  . oxyCODONE 10 MG TABS Take 1 tablet (10 mg total) by mouth every 4 (four) hours as needed for severe pain.  30 tablet  0   No current facility-administered medications on file prior to visit.   History reviewed. No pertinent family history. History   Social History  . Marital Status: Married    Spouse Name: N/A    Number of Children: N/A  . Years of Education: N/A   Occupational History  . Not on file.   Social History Main Topics  . Smoking status: Never Smoker   . Smokeless tobacco: Never Used  . Alcohol Use: No  . Drug Use:  No  . Sexual Activity: Yes   Other Topics Concern  . Not on file   Social History Narrative  . No narrative on file   Review of Systems  Constitutional: Positive for chills. Negative for fever.  Eyes: Negative.   Respiratory: Positive for shortness of breath. Negative for cough and wheezing.   Cardiovascular: Positive for leg swelling. Negative for palpitations.  Gastrointestinal: Negative.   Genitourinary: Negative.   Musculoskeletal: Positive for joint pain.  Neurological: Positive for dizziness and weakness. Negative for headaches.      Objective:   Filed Vitals:   03/30/14 0911  BP: 159/92  Pulse: 66  Temp: 98.3 F (36.8 C)  Resp: 16   Physical Exam  Vitals reviewed. Constitutional: She is oriented to person, place, and time.  Eyes: Pupils are equal, round, and reactive to light.  Neck: Normal range of motion. Neck supple.  Cardiovascular: Normal rate, regular rhythm, normal heart sounds and intact distal pulses.   Pulmonary/Chest: Effort normal and breath sounds normal. She has no rales.  Abdominal: Soft. Bowel sounds are normal. She exhibits no distension. There is no tenderness.  Musculoskeletal: She exhibits edema (3+ pitting edema) and tenderness.  Neurological: She is alert and oriented to person, place, and time.  Skin: Skin is  warm and dry.  Psychiatric: She has a normal mood and affect. Thought content normal.     Lab Results  Component Value Date   WBC 3.5* 03/14/2014   HGB 9.2* 03/14/2014   HCT 26.8* 03/14/2014   MCV 80.2 03/14/2014   PLT 367 03/14/2014   Lab Results  Component Value Date   CREATININE 0.40* 03/15/2014   BUN 25* 03/15/2014   NA 133* 03/15/2014   K 5.0 03/15/2014   CL 104 03/15/2014   CO2 19 03/15/2014    No results found for this basename: HGBA1C   Lipid Panel     Component Value Date/Time   CHOL 92 03/10/2014 1330       Assessment and plan:   Wendy Kaufman was seen today for follow-up.  Diagnoses and associated orders for this  visit:  Lupus vasculitis Unresolved peripheral edema, likely d/t hypoalbuminemia. Will get urine microalbumin and possible chest xray if rales are noted on follow up visit with Dr. Sidney Kaufman in one week.  Will continue to monitor patient closely until she is able to get in with Rheumatology  Anemia of chronic disease Will continue to monitor  Joint pain - traMADol (ULTRAM) 50 MG tablet; Take 1 tablet (50 mg total) by mouth every 8 (eight) hours as needed.  Refused influenza vaccine Strongly advised patient to get flu shot.  Will address on follow up visits.    Will follow up in 1-2 weeks with Dr. Sidney Kaufman .       Holland Commons, NP-C [facility] [phone] 03/30/2014, 9:29 AM

## 2014-04-13 ENCOUNTER — Ambulatory Visit: Payer: Medicaid Other | Attending: Internal Medicine

## 2014-04-14 ENCOUNTER — Ambulatory Visit: Payer: Self-pay | Attending: Family Medicine | Admitting: Family Medicine

## 2014-04-14 ENCOUNTER — Encounter: Payer: Self-pay | Admitting: Family Medicine

## 2014-04-14 VITALS — BP 119/80 | HR 93 | Temp 98.5°F | Resp 18 | Ht 60.63 in | Wt 117.0 lb

## 2014-04-14 DIAGNOSIS — D6489 Other specified anemias: Secondary | ICD-10-CM

## 2014-04-14 DIAGNOSIS — J90 Pleural effusion, not elsewhere classified: Secondary | ICD-10-CM

## 2014-04-14 DIAGNOSIS — M3219 Other organ or system involvement in systemic lupus erythematosus: Secondary | ICD-10-CM

## 2014-04-14 DIAGNOSIS — I776 Arteritis, unspecified: Secondary | ICD-10-CM | POA: Insufficient documentation

## 2014-04-14 DIAGNOSIS — M329 Systemic lupus erythematosus, unspecified: Secondary | ICD-10-CM | POA: Insufficient documentation

## 2014-04-14 LAB — POCT URINALYSIS DIPSTICK
BILIRUBIN UA: NEGATIVE
Glucose, UA: NEGATIVE
Ketones, UA: NEGATIVE
LEUKOCYTES UA: NEGATIVE
NITRITE UA: NEGATIVE
Protein, UA: 100
Spec Grav, UA: 1.015
Urobilinogen, UA: 0.2
pH, UA: 5.5

## 2014-04-14 LAB — CBC
HCT: 34.1 % — ABNORMAL LOW (ref 36.0–46.0)
HEMOGLOBIN: 11.5 g/dL — AB (ref 12.0–15.0)
MCH: 27.4 pg (ref 26.0–34.0)
MCHC: 33.7 g/dL (ref 30.0–36.0)
MCV: 81.2 fL (ref 78.0–100.0)
Platelets: 703 10*3/uL — ABNORMAL HIGH (ref 150–400)
RBC: 4.2 MIL/uL (ref 3.87–5.11)
RDW: 20.3 % — ABNORMAL HIGH (ref 11.5–15.5)
WBC: 7.8 10*3/uL (ref 4.0–10.5)

## 2014-04-14 MED ORDER — PREDNISONE 20 MG PO TABS: 20.0000 mg | ORAL_TABLET | Freq: Every day | ORAL | Status: DC

## 2014-04-14 MED ORDER — OMEPRAZOLE 40 MG PO CPDR: 40.0000 mg | DELAYED_RELEASE_CAPSULE | Freq: Every day | ORAL | Status: DC

## 2014-04-14 NOTE — Patient Instructions (Addendum)
Senora Sanchez-Flores,  Gracias por venido hoy.  1. For lupus: Checking labs sed rate, BMP, small blood on UA, 100 protein increased from 30 last mont. P:  Please go for chest x-ray  Given your significant symptoms  (swelling, fluid on lungs) I do want you to take prednisone 40 mg daily for next 3 days, then 20 mg daily.   Take with PPI. Referral to rheumatology placed.  F/u in 3 weeks   Dr. Armen Pickup

## 2014-04-14 NOTE — Progress Notes (Signed)
   Subjective:    Patient ID: Wendy Kaufman, female    DOB: [birth year], 28 y.o.   MRN: [id number] CC: PCP switch, f/u lupus vasculitis  HPI 28 year old female recently diagnosed with lupus vasculitis which he presented to the ED had to be hospitalized with right pleural effusion, edema or lymphadenopathy.  Patient was treated with pleurocentesis and steroids.  Right axillary lymph node biopsy was negative for monoclonal B cells or abnormal T cells.  Patient was discharged home with Lasix and prednisone, instructed to followup with Dr. Lurena Nida with rheumatology. For the last month patient has been out of prednisone. Patient has yet to have follow up with rheumatology.   Review of Systems Admits: Hair loss, hives, facial tenderness  Denies chest pain, cough,     Objective:   Physical Exam BP 119/80  Pulse 93  Temp(Src) 98.5 F (36.9 C) (Oral)  Resp 18  Ht 5' 0.63" (1.54 m)  Wt 117 lb (53.071 kg)  BMI 22.38 kg/m2  SpO2 98% General appearance: alert, cooperative and no distress Lungs: clear to auscultation bilaterally Heart: regular rate and rhythm, S1, S2 normal, no murmur, click, rub or gallop Extremities: edema none  Skin: few brown papules on legs, non tender  Lymph nodes: Cervical, supraclavicular, and axillary nodes normal. healed biopsy site.      Assessment & Plan:

## 2014-04-14 NOTE — Assessment & Plan Note (Signed)
For lupus: Checking labs sed rate, BMP, small blood on UA, 100 protein increased from 30 last mont. P:  Please go for chest x-ray to f/u R pleural effusion  Given your significant symptoms  (swelling, fluid on lungs) I do want you to take prednisone 40 mg daily for next 3 days, then 20 mg daily.  Continue lasix   Take with PPI while taking prednisone Referral to rheumatology placed patient was suppose to f/u with Dr. Lurena Nida one week following hospital discharge on 03/15/14.   F/u in 3 weeks

## 2014-04-14 NOTE — Progress Notes (Signed)
F/U lupus

## 2014-04-15 LAB — BASIC METABOLIC PANEL
BUN: 12 mg/dL (ref 6–23)
CO2: 23 mEq/L (ref 19–32)
Calcium: 8.6 mg/dL (ref 8.4–10.5)
Chloride: 107 mEq/L (ref 96–112)
Creat: 0.52 mg/dL (ref 0.50–1.10)
GLUCOSE: 128 mg/dL — AB (ref 70–99)
POTASSIUM: 4.2 meq/L (ref 3.5–5.3)
SODIUM: 139 meq/L (ref 135–145)

## 2014-04-15 LAB — SEDIMENTATION RATE: Sed Rate: 14 mm/hr (ref 0–22)

## 2014-04-18 ENCOUNTER — Ambulatory Visit (HOSPITAL_COMMUNITY)
Admission: RE | Admit: 2014-04-18 | Discharge: 2014-04-18 | Disposition: A | Discharge status: Home / Self Care | Payer: Medicaid Other | Source: Ambulatory Visit | Attending: Family Medicine | Admitting: Family Medicine

## 2014-04-18 ENCOUNTER — Telehealth: Payer: Self-pay | Admitting: *Deleted

## 2014-04-18 DIAGNOSIS — J9819 Other pulmonary collapse: Secondary | ICD-10-CM | POA: Insufficient documentation

## 2014-04-18 DIAGNOSIS — J90 Pleural effusion, not elsewhere classified: Secondary | ICD-10-CM | POA: Insufficient documentation

## 2014-04-18 NOTE — Telephone Encounter (Signed)
Pt aware of Xray Results, has apt on 05/03/2014

## 2014-04-18 NOTE — Telephone Encounter (Signed)
Message copied by Dyann Kief on Mon Apr 18, 2014  4:01 PM ------      Message from: Dessa Phi      Created: Mon Apr 18, 2014  1:50 PM       Small amounts of fluid on both lungs a bit more on the right side.      The goal of steroid therapy is to prevent this fluid from building up as much as it did during the hospitalization.      Continue prednisone      Keep f/u with Dr. Armen Pickup ------

## 2014-04-20 ENCOUNTER — Telehealth: Payer: Self-pay | Admitting: *Deleted

## 2014-04-20 NOTE — Telephone Encounter (Signed)
Pt aware of lab results 

## 2014-04-20 NOTE — Telephone Encounter (Signed)
Message copied by Dyann KiefGIRALDEZ, Irby Fails M on Wed Apr 20, 2014  3:53 PM ------      Message from: Dessa PhiFUNCHES, JOSALYN      Created: Tue Apr 19, 2014  2:10 PM       Normal BMP      Cr with improved Hgb, elevated plts.       Sed rate normal which is improved. ------

## 2014-04-24 LAB — AFB CULTURE WITH SMEAR (NOT AT ARMC)
Acid Fast Smear: NONE SEEN
Special Requests: NORMAL

## 2014-05-03 ENCOUNTER — Ambulatory Visit: Payer: Medicaid Other | Attending: Family Medicine | Admitting: Family Medicine

## 2014-05-03 ENCOUNTER — Encounter: Payer: Self-pay | Admitting: Family Medicine

## 2014-05-03 VITALS — BP 144/97 | HR 89 | Temp 98.4°F | Resp 16 | Ht 60.63 in | Wt 111.0 lb

## 2014-05-03 DIAGNOSIS — M3219 Other organ or system involvement in systemic lupus erythematosus: Secondary | ICD-10-CM

## 2014-05-03 DIAGNOSIS — J90 Pleural effusion, not elsewhere classified: Secondary | ICD-10-CM | POA: Insufficient documentation

## 2014-05-03 DIAGNOSIS — M329 Systemic lupus erythematosus, unspecified: Secondary | ICD-10-CM | POA: Insufficient documentation

## 2014-05-03 DIAGNOSIS — J948 Other specified pleural conditions: Secondary | ICD-10-CM

## 2014-05-03 DIAGNOSIS — Z6821 Body mass index (BMI) 21.0-21.9, adult: Secondary | ICD-10-CM | POA: Insufficient documentation

## 2014-05-03 MED ORDER — PREDNISONE 10 MG PO TABS: 10.0000 mg | ORAL_TABLET | Freq: Every day | ORAL | Status: DC

## 2014-05-03 NOTE — Progress Notes (Signed)
   Subjective:    Patient ID: Wendy Kaufman, female    DOB: [birth year], 28 y.o.   MRN: [id number] CC: f/u lupus  HPI 28 yo Hispanic F with lupus vasculitis (dx 02/2014) presents for f/u visit:  1. Lupus: doing well. Had some L flank pain two days ago. None now. No CP, SOB, hematuria, dysuria, fever. Taking prednisone 20 mg daily. Taking PPI. Taking lasix. No LE edema. Has received letter regarding f/u options which [facility] vs f/u with Wendy Kaufman. Patient has young children and ask if lupus is contagious.   Review of Systems As per HPI  Patient endorses weight loss     Objective:   Physical Exam BP 144/97  Pulse 89  Temp(Src) 98.4 F (36.9 C) (Oral)  Resp 16  Ht 5' 0.63" (1.54 m)  Wt 111 lb (50.349 kg)  BMI 21.23 kg/m2  SpO2 99% Wt Readings from Last 3 Encounters:  05/03/14 111 lb (50.349 kg)  04/14/14 117 lb (53.071 kg)  03/30/14 124 lb (56.246 kg)   BP Readings from Last 3 Encounters:  05/03/14 144/97  04/14/14 119/80  03/30/14 159/92  General appearance: alert, cooperative and no distress Back: symmetric, no curvature. ROM normal. No CVA tenderness. Lungs: clear to auscultation bilaterally Heart: regular rate and rhythm, S1, S2 normal, no murmur, click, rub or gallop Extremities: extremities normal, atraumatic, no cyanosis or edema  Reviewed CXR and labs with patient.   Spanish interpreted helped patient call Wendy Kaufman office. He will not be able to see her without insurance. [facility]     Assessment & Plan:

## 2014-05-03 NOTE — Assessment & Plan Note (Signed)
A: mild re accumulation of pleural effusions while off prednison. Patient asymptomatic. Patient back on prednisone 40 mg x 2 days, then 20 mg, now 10 mg daily. P: F/u based on symptoms

## 2014-05-03 NOTE — Progress Notes (Signed)
F/U from last visit

## 2014-05-03 NOTE — Assessment & Plan Note (Addendum)
A: patient stable on prednisone 20 mg daily P: Decrease prednisone to 10 mg daily Continue lasix and prilosec  F/u BMP [facility]

## 2014-05-03 NOTE — Patient Instructions (Addendum)
Wendy Kaufman,   Thank you for coming in today. Your lupus is doing well. Continue prednisone decrease to 10 mg daily  You still rheumatology follow up either Dr. Dareen PianoAnderson or [facility]   Continue lasix 20 mg daily for now checking BMP today to f/u potassium and creantine.   F/u in 6 weeks  Dr. Armen PickupFunches

## 2014-05-04 LAB — BASIC METABOLIC PANEL
BUN: 9 mg/dL (ref 6–23)
CALCIUM: 8.8 mg/dL (ref 8.4–10.5)
CO2: 21 meq/L (ref 19–32)
Chloride: 105 mEq/L (ref 96–112)
Creat: 0.4 mg/dL — ABNORMAL LOW (ref 0.50–1.10)
Glucose, Bld: 82 mg/dL (ref 70–99)
Potassium: 3.7 mEq/L (ref 3.5–5.3)
SODIUM: 135 meq/L (ref 135–145)

## 2014-05-09 ENCOUNTER — Telehealth: Payer: Self-pay | Admitting: *Deleted

## 2014-05-09 NOTE — Telephone Encounter (Signed)
Left voice message with lab results, if any question return call

## 2014-05-09 NOTE — Telephone Encounter (Signed)
Message copied by Dyann KiefGIRALDEZ, Arieana Somoza M on Mon May 09, 2014  4:31 PM ------      Message from: Dessa PhiFUNCHES, JOSALYN      Created: Wed May 04, 2014 10:01 AM       Stable BMP      Continue current medication regimen ------

## 2014-05-16 ENCOUNTER — Telehealth: Payer: Self-pay | Admitting: Family Medicine

## 2014-05-16 NOTE — Telephone Encounter (Signed)
Pt. Called to request a refill for her pain medication, pt. States that she ran out a couple of days ago and she has been having body aches. Please f/u with pt . W/ an interpreter.

## 2014-05-17 ENCOUNTER — Encounter (HOSPITAL_COMMUNITY): Payer: Self-pay | Admitting: Emergency Medicine

## 2014-05-17 ENCOUNTER — Emergency Department (HOSPITAL_COMMUNITY)
Admission: EM | Admit: 2014-05-17 | Discharge: 2014-05-18 | Disposition: A | Discharge status: Home / Self Care | Payer: Self-pay | Attending: Emergency Medicine | Admitting: Emergency Medicine

## 2014-05-17 ENCOUNTER — Emergency Department (HOSPITAL_COMMUNITY): Payer: Medicaid Other

## 2014-05-17 DIAGNOSIS — M329 Systemic lupus erythematosus, unspecified: Secondary | ICD-10-CM | POA: Insufficient documentation

## 2014-05-17 DIAGNOSIS — J90 Pleural effusion, not elsewhere classified: Secondary | ICD-10-CM | POA: Insufficient documentation

## 2014-05-17 DIAGNOSIS — R52 Pain, unspecified: Secondary | ICD-10-CM

## 2014-05-17 DIAGNOSIS — R000 Tachycardia, unspecified: Secondary | ICD-10-CM | POA: Insufficient documentation

## 2014-05-17 LAB — CBC WITH DIFFERENTIAL/PLATELET
BASOS ABS: 0 10*3/uL (ref 0.0–0.1)
Basophils Relative: 0 % (ref 0–1)
EOS ABS: 0 10*3/uL (ref 0.0–0.7)
Eosinophils Relative: 0 % (ref 0–5)
HCT: 36.2 % (ref 36.0–46.0)
HEMOGLOBIN: 12.6 g/dL (ref 12.0–15.0)
LYMPHS PCT: 15 % (ref 12–46)
Lymphs Abs: 1.5 10*3/uL (ref 0.7–4.0)
MCH: 28.1 pg (ref 26.0–34.0)
MCHC: 34.8 g/dL (ref 30.0–36.0)
MCV: 80.6 fL (ref 78.0–100.0)
Monocytes Absolute: 0.4 10*3/uL (ref 0.1–1.0)
Monocytes Relative: 4 % (ref 3–12)
NEUTROS PCT: 81 % — AB (ref 43–77)
Neutro Abs: 8 10*3/uL — ABNORMAL HIGH (ref 1.7–7.7)
PLATELETS: 519 10*3/uL — AB (ref 150–400)
RBC: 4.49 MIL/uL (ref 3.87–5.11)
RDW: 17.6 % — ABNORMAL HIGH (ref 11.5–15.5)
WBC MORPHOLOGY: INCREASED
WBC: 9.9 10*3/uL (ref 4.0–10.5)

## 2014-05-17 LAB — COMPREHENSIVE METABOLIC PANEL
ALBUMIN: 3.3 g/dL — AB (ref 3.5–5.2)
ALK PHOS: 123 U/L — AB (ref 39–117)
ALT: 21 U/L (ref 0–35)
AST: 23 U/L (ref 0–37)
Anion gap: 16 — ABNORMAL HIGH (ref 5–15)
BUN: 10 mg/dL (ref 6–23)
CO2: 18 mEq/L — ABNORMAL LOW (ref 19–32)
Calcium: 8.6 mg/dL (ref 8.4–10.5)
Chloride: 102 mEq/L (ref 96–112)
Creatinine, Ser: 0.41 mg/dL — ABNORMAL LOW (ref 0.50–1.10)
GFR calc Af Amer: >90 mL/min (ref >90)
GFR calc non Af Amer: >90 mL/min (ref >90)
GLUCOSE: 99 mg/dL (ref 70–99)
POTASSIUM: 3.4 meq/L — AB (ref 3.7–5.3)
SODIUM: 136 meq/L — AB (ref 137–147)
TOTAL PROTEIN: 7.7 g/dL (ref 6.0–8.3)
Total Bilirubin: 0.3 mg/dL (ref 0.3–1.2)

## 2014-05-17 LAB — SEDIMENTATION RATE: Sed Rate: 30 mm/hr — ABNORMAL HIGH (ref 0–22)

## 2014-05-17 MED ORDER — SODIUM CHLORIDE 0.9 % IV SOLN
INTRAVENOUS | Status: DC
Start: 2014-05-17 — End: 2014-05-18
  Administered 2014-05-17: 125 mL/h via INTRAVENOUS

## 2014-05-17 MED ORDER — HYDROMORPHONE HCL 1 MG/ML IJ SOLN
1.0000 mg | INTRAMUSCULAR | Status: DC | PRN
  Administered 2014-05-17 – 2014-05-18 (×2): 1 mg via INTRAVENOUS
  Filled 2014-05-17 (×2): qty 1

## 2014-05-17 MED ORDER — METHYLPREDNISOLONE SODIUM SUCC 125 MG IJ SOLR
125.0000 mg | Freq: Once | INTRAMUSCULAR | Status: AC
  Administered 2014-05-17: 125 mg via INTRAVENOUS
  Filled 2014-05-17: qty 2

## 2014-05-17 NOTE — ED Notes (Addendum)
To xray, family with pt. Pt alert, NAD, calm, interactive, resps e/u, skin W&D, speaking in clearcomplete senences, VSS.

## 2014-05-17 NOTE — ED Notes (Signed)
Pt placed into gown and on monitor upon arrival to room. Pt monitored by blood pressure, pulse ox, and 5 lead. Pts family remains at bedside.  

## 2014-05-17 NOTE — ED Notes (Signed)
No longer crying/ moaning, "feel better", pending CXR and UA.alert, NAD, calmer, [facility]

## 2014-05-17 NOTE — ED Notes (Signed)
Pt reports running out of lupus medication on Sunday. States that ever since has increased generalized body aches. Pt appears to be in distress in triage, unable to sit still and crying. Denies HA. Pt is AO x4.

## 2014-05-17 NOTE — ED Notes (Signed)
Pt placed on monitor upon arrival to room from radiology.Pt remains monitored by blood pressure, pulse ox, and 5 lead. Pt asked to provide urine specimen pt stated she could not provide one at this time. Labeled specimen cup placed at bedside. Pt will inform staff when she needs to use restroom.

## 2014-05-17 NOTE — ED Notes (Signed)
Pt reports running out of lupus medication. Pt reports neck pain and generalized weakness since yesterday.  Pt is softly moaning and crying.

## 2014-05-18 MED ORDER — HYDROCODONE-ACETAMINOPHEN 5-325 MG PO TABS: 1.0000 | ORAL_TABLET | ORAL | Status: DC | PRN

## 2014-05-18 MED ORDER — PREDNISONE (PAK) 10 MG PO TABS: ORAL_TABLET | ORAL | Status: DC

## 2014-05-18 NOTE — ED Provider Notes (Signed)
CSN: [id number]     Arrival date & time 05/17/14  2119 History   First MD Initiated Contact with Patient 05/17/14 2147     Chief Complaint  Patient presents with  . Lupus    HPI Patient presents to the emergency room with complaints of diffuse bodyaches that have progressively become more severe.  The symptoms started a couple of days ago after she ran out of her lupus medications. The pain has been aching and severe. The pain has been all over her body especially in her joints as well as her neck. Is not having any fevers. She denies any shortness of breath. She denies any vomiting or diarrhea Past Medical History  Diagnosis Date  . Medical history non-contributory   . Lupus 03/09/14   Past Surgical History  Procedure Laterality Date  . Axillary lymph node biopsy Right 03/11/2014    Procedure: AXILLARY LYMPH NODE BIOPSY;  Surgeon: Wilmon ArmsMatthew K. Corliss Skainssuei, MD;  Location: [facility] OR;  Service: General;  Laterality: Right;   No family history on file. History  Substance Use Topics  . Smoking status: Never Smoker   . Smokeless tobacco: Never Used  . Alcohol Use: No   OB History   Grav Para Term Preterm Abortions TAB SAB Ect Mult Living   3 3 3  0 0 0 0 0 0 3     Review of Systems  All other systems reviewed and are negative.     Allergies  Review of patient's allergies indicates no known allergies.  Home Medications   Prior to Admission medications   Medication Sig Start Date End Date Taking? Authorizing Provider  acetaminophen (TYLENOL) 500 MG tablet Take 1,000 mg by mouth every 6 (six) hours as needed for mild pain.   Yes Historical Provider, MD  HYDROcodone-acetaminophen (NORCO/VICODIN) 5-325 MG per tablet Take 1-2 tablets by mouth every 4 (four) hours as needed. 05/18/14   Linwood DibblesJon Tallen Schnorr, MD  predniSONE (STERAPRED UNI-PAK) 10 MG tablet Take 1 tab by mouth daily 05/18/14   Linwood DibblesJon Kennette Cuthrell, MD   BP 163/106  Pulse 105  Temp(Src) 97.7 F (36.5 C) (Oral)  Resp 22  SpO2 97% Physical Exam   Nursing note and vitals reviewed. Constitutional: She appears well-developed and well-nourished. She appears distressed.  HENT:  Head: Normocephalic and atraumatic.  Right Ear: External ear normal.  Left Ear: External ear normal.  Mouth/Throat: No oropharyngeal exudate.  Eyes: Conjunctivae are normal. Right eye exhibits no discharge. Left eye exhibits no discharge. No scleral icterus.  Neck: Neck supple. No tracheal deviation present.  Cardiovascular: Regular rhythm and intact distal pulses.  Tachycardia present.   Pulmonary/Chest: Effort normal and breath sounds normal. No stridor. No respiratory distress. She has no wheezes. She has no rales.  Abdominal: Soft. Bowel sounds are normal. She exhibits no distension. There is no tenderness. There is no rebound and no guarding.  Musculoskeletal: She exhibits no edema and no tenderness.  Neurological: She is alert. She has normal strength. No cranial nerve deficit (no facial droop, extraocular movements intact, no slurred speech) or sensory deficit. She exhibits normal muscle tone. She displays no seizure activity. Coordination normal.  Skin: Skin is warm and dry. No rash noted.  Psychiatric: She has a normal mood and affect.    ED Course  Procedures (including critical care time) Labs Review Labs Reviewed  CBC WITH DIFFERENTIAL - Abnormal; Notable for the following:    RDW 17.6 (*)    Platelets 519 (*)    Neutrophils Relative %  81 (*)    Neutro Abs 8.0 (*)    All other components within normal limits  COMPREHENSIVE METABOLIC PANEL - Abnormal; Notable for the following:    Sodium 136 (*)    Potassium 3.4 (*)    CO2 18 (*)    Creatinine, Ser 0.41 (*)    Albumin 3.3 (*)    Alkaline Phosphatase 123 (*)    Anion gap 16 (*)    All other components within normal limits  SEDIMENTATION RATE - Abnormal; Notable for the following:    Sed Rate 30 (*)    All other components within normal limits  URINALYSIS, ROUTINE W REFLEX MICROSCOPIC     Imaging Review Dg Chest 2 View  05/17/2014   CLINICAL DATA:  Mid chest pain, lupus.  EXAM: CHEST  2 VIEW  COMPARISON:  04/18/2014  FINDINGS: Left greater than right pleural effusions, similar on the left and slightly decreased on the right. Associated airspace opacities, favor atelectasis. Cardiomediastinal contours are similar to prior. No pneumothorax. No interval osseous change.  IMPRESSION: Left greater than right pleural effusions with associated airspace opacities, favor atelectasis.   Electronically Signed   By: Jearld LeschAndrew  DelGaizo M.D.   On: 05/17/2014 22:58     EKG Interpretation None     Medications  0.9 %  sodium chloride infusion (125 mL/hr Intravenous New Bag/Given 05/17/14 2216)  HYDROmorphone (DILAUDID) injection 1 mg (1 mg Intravenous Given 05/17/14 2216)  methylPREDNISolone sodium succinate (SOLU-MEDROL) 125 mg/2 mL injection 125 mg (125 mg Intravenous Given 05/17/14 2218)    MDM   Final diagnoses:  Pain  Lupus arthritis  Pleural effusion    The patient was given medications for pain. IV steroids were also administered. She had significant relief of her symptoms.  I discussed the findings with the patient. She would prefer to go home.  Old records were reviewed. Patient does have persistent pleural effusions. She was supposed be taking 10 mg of prednisone daily but ran out a few days ago. Patient does have a mild decrease in her bicarbonate level but this is similar to prior labs.  I will start her on 10 mg prednisone daily once again. The patient will follow up with her primary care doctor. She understands to return for fever or worsening symptoms.   Linwood DibblesJon Aleenah Homen, MD 05/18/14 Burna Mortimer0010

## 2014-05-18 NOTE — Discharge Instructions (Signed)
Lupus (Lupus) El lupus (tambin denominado lupus eritematoso sistmico) es una enfermedad del sistema de defensas naturales del organismo (sistema inmunolgico). En esta enfermedad, el sistema inmunolgico ataca diferentes reas del cuerpo (enfermedad autoinmune). CAUSAS La causa es desconocida. Sin embargo, el lupus puede aparecer en miembros de una misma familia. Ciertos genes pueden favorecer la aparicin de esta enfermedad. Es un poco ms comn EMCORentre las mujeres. Tambin es ms frecuente Aflac Incorporatedentre las personas de raza afroamericana y los asiticos. Otros factores que tambin influyen son algunos virus (virus Epstein-Barr, EBV),estrs, hormonas, humo de cigarrillo y Midwifeciertas drogas. SNTOMAS Puede afectar diferentes partes del organismo, incluyendo las articulaciones, la piel, los riones, los pulmones, el corazn, el sistema nervioso y los vasos sanguneos. Los signos y sntomas difieren de Neomia Dearuna persona a Liechtensteinotra. La enfermedad puede ser leve o poner peligro la vida. Los rasgos caractersticos de esta enfermedad son:  Erupcin en forma de mariposa en el rostro.  Artritis que involucra una o ms articulaciones.  Enfermedades renales.  Cane SavannahFiebre, prdida de Gouldsboropeso, prdida del cabello, fatiga.  Mala circulacin en los dedos de las manos y los pies (enfermedad de Raynaud)  Dolor en el pecho al inspirar profundo. Tambin puede haber dolor abdominal.  Erupcin en la piel en zonas expuestas al sol.  Llagas en la boca y la Balch Springsnariz. DIAGNSTICO El diagnstico puede demorar mucho tiempo y con frecuencia es difcil. Es muy importante un registro exacto de los sntomas y de los problemas de Corinnesalud. Los anlisis de sangre son necesarios, aunque una nica prueba no puede confirmar la presencia de lupus. La Harley-Davidsonmayora de las personas que sufren lupus son positivos a los anticuerpos antinucleares en el anlisis de Morasangre. Anlisis adicionales de Tajikistansangre y de Comor[place]orina y en algunos casos la toma de Colombiauna muestra de tejido del  rin o de la piel pueden ayudar a Astronomerconfirmar o descartar lupus. TRATAMIENTO No hay cura para el lupus. El Adult nursemdico elaborar un plan de tratamiento basado en la edad, sexo, estado de Griswoldsalud, sntomas y Boardmanestilo de Connecticutvida. Los objetivos son evitar brotes, tratarlos cuando aparezcan, minimizar el dao a los rganos y las complicaciones. El modo en que la enfermedad puede afectar a cada persona vara considerablemente. La Harley-Davidsonmayora de las personas que sufren lupus puede vivir una vida normal, pero esta enfermedad debe controlarse cuidadosamente. El tratamiento debe ajustarse segn las necesidades para evitar complicaciones. Medicamentos utilizados en el tratamiento:  Los antiinflamatorios no esteroides (AIDE) disminuyen la inflamacin y Optician, dispensingpueden ayudar en caso de dolor en el pecho, en las articulaciones y la Wakarusafiebre. Pueden ser ibuprofeno y naproxeno.  Los medicamentos contra la malaria fueron diseados para tratar la malaria. Tambin tratan la fatiga, Chief Technology Officerel dolor en las articulaciones, las erupciones de la piel y la inflamacin de los pulmones en pacientes con lupus.  Los corticoides son hormonas poderosas que suprimen rpidamente la inflamacin. Se elegir la dosis ms baja que proporcione el mayor beneficio. Podrn recetarlos en forma de crema, pldoras, inyecciones o por vena (intravenosa).  Los medicamentos inmunosupresores detienen la formacin de clulas inmunes. Pueden utilizarse en casos de enfermedades del rin o nerviosas. INSTRUCCIONES PARA EL CUIDADO DOMICILIARIO  La prctica de ejercicios. Las actividades de bajo impacto ayudan a Pharmacologistmantener las articulaciones flexibles sin ser demasiado extenuantes.  Haga reposo luego de la actividad fsica.  Evite la excesiva exposicin al sol.  Siga una nutricin Svalbard & Jan Mayen Islandsadecuada y tome los suplementos que le haya indicado el mdico.  El control del estrs puede beneficiarlo. SOLICITE ATENCIN MDICA SI:  Lenora BoysAumenta  la fatiga.  Siente dolor.  Aparece una erupcin  cutnea.  La temperatura oral se eleva sin motivo por arriba de 38,9 C (102 F).  Siente molestias abdominales.  Comienza a Financial risk analystsentir dolor de cabeza.  Tiene mareos. Irven ShellingPARA OBTENER MS INFORMACIN The General Millsational Institute of Neurological Disorders and Stroke (The Krogernstituto Nacional de Trastornos y Accidentes Neurolgicos): ToledoAutomobile.co.ukwww.ninds.nih.gov Celanese Corporationmerican College of Rheumatology : www.rheumatology.Dana Corporationorg National Institute of Arthritis and Musculoskeletal and Skin Diseases : [employer](Instituto Nacional para la Artritis y las Enfermedades Musculoesquelticas y Dermatolgicas: www.niams.http://www.myers.net/nih.gov Document Released: 10/04/2008 Document Revised: 09/30/2011 [facility]ExitCare Patient Information 2015 SperryExitCare, MarylandLLC. This information is not intended to replace advice given to you by your health care provider. Make sure you discuss any questions you have with your health care provider.

## 2014-05-23 ENCOUNTER — Encounter (HOSPITAL_COMMUNITY): Payer: Self-pay | Admitting: Emergency Medicine

## 2014-06-07 ENCOUNTER — Ambulatory Visit: Payer: Self-pay | Attending: Family Medicine | Admitting: Family Medicine

## 2014-06-07 ENCOUNTER — Encounter: Payer: Self-pay | Admitting: Family Medicine

## 2014-06-07 DIAGNOSIS — I7789 Other specified disorders of arteries and arterioles: Secondary | ICD-10-CM

## 2014-06-07 DIAGNOSIS — M329 Systemic lupus erythematosus, unspecified: Secondary | ICD-10-CM | POA: Insufficient documentation

## 2014-06-07 DIAGNOSIS — I776 Arteritis, unspecified: Secondary | ICD-10-CM | POA: Insufficient documentation

## 2014-06-07 LAB — BASIC METABOLIC PANEL
BUN: 7 mg/dL (ref 6–23)
CO2: 23 mEq/L (ref 19–32)
CREATININE: 0.49 mg/dL — AB (ref 0.50–1.10)
Calcium: 8.5 mg/dL (ref 8.4–10.5)
Chloride: 107 mEq/L (ref 96–112)
Glucose, Bld: 79 mg/dL (ref 70–99)
POTASSIUM: 3.7 meq/L (ref 3.5–5.3)
Sodium: 136 mEq/L (ref 135–145)

## 2014-06-07 MED ORDER — OMEPRAZOLE 20 MG PO CPDR: 20.0000 mg | DELAYED_RELEASE_CAPSULE | Freq: Every day | ORAL | Status: DC

## 2014-06-07 MED ORDER — PREDNISONE 20 MG PO TABS: 20.0000 mg | ORAL_TABLET | Freq: Every day | ORAL | Status: DC

## 2014-06-07 MED ORDER — BIOTIN 1000 MCG PO TABS: 1000.0000 ug | ORAL_TABLET | Freq: Every day | ORAL | Status: DC

## 2014-06-07 NOTE — Assessment & Plan Note (Addendum)
A: new oral lesions, hair loss. Taking prednisone 10 mg daily P: Restart prednisone 20 mg daily  Take prilosec 20 mg daily before a meal Get and take biotin 1000 mcg daily for hair loss, $7 at [employer], target brand  We will still work to get you to a rheumatologist.  We will call you with lab results   F/u in 6 weeks

## 2014-06-07 NOTE — Progress Notes (Signed)
   Subjective:    Patient ID: Wendy Kaufman, female    DOB: 12-28-1985, 28 y.o.   MRN: [id number] CC: f/u complaining of body aches, mouth sore sometimes and hair loss.  HPI 28 year old female presents for follow-up visit to discuss the following:  1. Lupus:  Patient with history of lupus. She is experiencing hair loss. She has an oral lesion on her palate. She is taking prednisone 10 mg daily. She is not taking PPI or Lasix. She denies shortness of breath, leg swelling.  Soc hx: non smoker  Review of Systems As per HPI     Objective:   Physical Exam BP 116/80 mmHg  Pulse 92  Temp(Src) 98 F (36.7 C) (Oral)  Resp 18  Ht 5' (1.524 m)  Wt 110 lb (49.896 kg)  BMI 21.48 kg/m2  SpO2 98%  Wt Readings from Last 3 Encounters:  06/07/14 110 lb (49.896 kg)  05/03/14 111 lb (50.349 kg)  04/14/14 117 lb (53.071 kg)  General appearance: alert, cooperative and no distress  Head: thinning hairs,  Throat: shallow ulcerations R posterior palate  Lungs: clear to auscultation bilaterally Heart: regular rate and rhythm, S1, S2 normal, no murmur, click, rub or gallop Extremities: extremities normal, atraumatic, no cyanosis or edema       Assessment & Plan:

## 2014-06-07 NOTE — Patient Instructions (Signed)
Ms. Wendy Kaufman,  For lupus: Restart prednisone 20 mg daily  Take prilosec 20 mg daily before a meal Get and take biotin 1000 mcg daily for hair loss, $7 at [employer], target brand  We will still work to get you to a rheumatologist.  We will call you with lab results   F/u in 6 weeks  Dr. Armen PickupFunches

## 2014-06-07 NOTE — Progress Notes (Signed)
Complaining of hair loss, body ache Stated sometime has sore in mouth.

## 2014-07-12 ENCOUNTER — Telehealth: Payer: Self-pay | Admitting: *Deleted

## 2014-07-12 NOTE — Telephone Encounter (Signed)
Unable to contact pt. 

## 2014-07-12 NOTE — Telephone Encounter (Signed)
-----   Message from Lora PaulaJosalyn C Funches, MD sent at 06/08/2014  9:03 AM EST ----- Normal BMP

## 2014-08-09 ENCOUNTER — Ambulatory Visit: Payer: Self-pay | Admitting: Family Medicine

## 2014-08-18 ENCOUNTER — Ambulatory Visit: Payer: Self-pay | Admitting: Family Medicine

## 2014-08-30 ENCOUNTER — Telehealth: Payer: Self-pay | Admitting: *Deleted

## 2014-08-30 ENCOUNTER — Ambulatory Visit: Payer: Self-pay | Attending: Family Medicine | Admitting: Family Medicine

## 2014-08-30 ENCOUNTER — Encounter: Payer: Self-pay | Admitting: Family Medicine

## 2014-08-30 VITALS — BP 114/77 | HR 66 | Temp 97.9°F | Resp 16 | Ht 61.0 in | Wt 105.0 lb

## 2014-08-30 DIAGNOSIS — M329 Systemic lupus erythematosus, unspecified: Secondary | ICD-10-CM

## 2014-08-30 DIAGNOSIS — R109 Unspecified abdominal pain: Secondary | ICD-10-CM | POA: Insufficient documentation

## 2014-08-30 DIAGNOSIS — Z23 Encounter for immunization: Secondary | ICD-10-CM

## 2014-08-30 DIAGNOSIS — Z7952 Long term (current) use of systemic steroids: Secondary | ICD-10-CM | POA: Insufficient documentation

## 2014-08-30 DIAGNOSIS — R1011 Right upper quadrant pain: Secondary | ICD-10-CM | POA: Insufficient documentation

## 2014-08-30 DIAGNOSIS — M3219 Other organ or system involvement in systemic lupus erythematosus: Secondary | ICD-10-CM

## 2014-08-30 DIAGNOSIS — Z79899 Other long term (current) drug therapy: Secondary | ICD-10-CM | POA: Insufficient documentation

## 2014-08-30 DIAGNOSIS — R1013 Epigastric pain: Secondary | ICD-10-CM | POA: Insufficient documentation

## 2014-08-30 DIAGNOSIS — R195 Other fecal abnormalities: Secondary | ICD-10-CM | POA: Insufficient documentation

## 2014-08-30 DIAGNOSIS — L958 Other vasculitis limited to the skin: Secondary | ICD-10-CM | POA: Insufficient documentation

## 2014-08-30 DIAGNOSIS — L932 Other local lupus erythematosus: Secondary | ICD-10-CM | POA: Insufficient documentation

## 2014-08-30 LAB — POCT URINE PREGNANCY: Preg Test, Ur: NEGATIVE

## 2014-08-30 LAB — POCT URINALYSIS DIPSTICK
Bilirubin, UA: NEGATIVE
Glucose, UA: NEGATIVE
Ketones, UA: NEGATIVE
LEUKOCYTES UA: NEGATIVE
NITRITE UA: NEGATIVE
Urobilinogen, UA: 0.2
pH, UA: 5.5

## 2014-08-30 MED ORDER — SUCRALFATE 1 G PO TABS: 1.0000 g | ORAL_TABLET | Freq: Three times a day (TID) | ORAL | Status: DC

## 2014-08-30 MED ORDER — OMEPRAZOLE 40 MG PO CPDR: 40.0000 mg | DELAYED_RELEASE_CAPSULE | Freq: Every day | ORAL | Status: DC

## 2014-08-30 MED ORDER — PREDNISONE 10 MG PO TABS: 10.0000 mg | ORAL_TABLET | Freq: Every day | ORAL | Status: DC

## 2014-08-30 MED ORDER — METHYLPREDNISOLONE ACETATE 80 MG/ML IJ SUSP
80.0000 mg | Freq: Once | INTRAMUSCULAR | Status: AC
Start: 2014-08-30 — End: 2014-08-30
  Administered 2014-08-30: 80 mg via INTRAMUSCULAR

## 2014-08-30 NOTE — Progress Notes (Signed)
Patient feels she is having a lupus exacerbation She complains of pain 8/10 mostly in her abdomen and feels she has "a protrusion" in her left upper quadrant that Prevents her from bending over Patient agrees to flu shot

## 2014-08-30 NOTE — Progress Notes (Signed)
   Subjective:    Patient ID: Wendy Kaufman, female    DOB: [birth year], 29 y.o.   MRN: [id number] CC: epigastric and RUQ pain x one week   HPI 29 yo F f/u:  1. Epigastric and RUQ pain: x one week. Feels likes there is a knot in RUQ. Associated with nausea. Emesis x one. Emesis no bloody and non bilious. No fever, sick contacts. Taking prednisone 10 mg daily. Taking prilosec 20 mg daily. Denies dysuria.   Soc Hx: non smoker  Review of Systems As per HPI     Objective:   Physical Exam BP 114/77 mmHg  Pulse 66  Temp(Src) 97.9 F (36.6 C)  Resp 16  Ht 5\' 1"  (1.549 m)  Wt 105 lb (47.628 kg)  BMI 19.85 kg/m2  SpO2 99%  LMP 08/20/2014 General appearance: alert, cooperative and no distress, thin hispanic female with thinning hair  Eyes: conjunctivae/corneas clear. PERRL, EOM's intact.  Throat: lips, mucosa, and tongue normal; teeth and gums normal Lungs: clear to auscultation bilaterally Heart: regular rate and rhythm, S1, S2 normal, no murmur, click, rub or gallop Abdomen: normal findings: bowel sounds normal, no masses palpable and no organomegaly and abnormal findings:  moderate tenderness in the epigastrium and in the RUQ w/o rebound or guarding Back: b/l CVA tenderness   Rectal: normal tone, normal external exam, soft stool in rectal vault. FOBT positive  Extremities: extremities normal, atraumatic, no cyanosis or edema   UA: > 300 protein, moderate blood.      Assessment & Plan:

## 2014-08-30 NOTE — Patient Instructions (Addendum)
Mrs. Samson FredericSanchez-Flores,  Thank you for coming in today.  1. Abdominal pain: I am most concerned for stomach ulcer. The test for blood in your stool is positive. This could also be a lupus flare.   Plan: Shot of steroid today, depo medrol 80 x one  Continue prednisone 10 mg daily with food Increase prilosec to 40 mg daily 30 minutes before largest meals. Start carafate (cuts stomach to help heal ulcers or irritation of stomach lining) before meals and at bedtime. Referral to GI doctor for possible scope.   Important notes: increase in blood and protein in urine is a part is due to lupus: referral to kidney specialist placed.   You will be called with lab results.  F/u in 4 weeks, sooner if needed if symptoms worsen  Dr. Armen PickupFunches

## 2014-08-30 NOTE — Assessment & Plan Note (Signed)
A: has established with rheumatology, tapered prednisone to 10 mg daily P: Referral to nephrology Continue prednisone 10 mg daily Shot of depo medrol 80 mg IM x one

## 2014-08-30 NOTE — Telephone Encounter (Signed)
-----   Message from Lora PaulaJosalyn C Funches, MD sent at 08/30/2014  4:58 PM EST ----- Zebedee IbaHey Durward Matranga, Please call patient. She left w.o getting bloodwork. Needs lab visit. She is already planning to come in tomorrow for carafate.

## 2014-08-30 NOTE — Addendum Note (Signed)
Addended by: Dessa PhiFUNCHES, Aleanna Menge on: 08/30/2014 05:39 PM   Modules accepted: Orders

## 2014-08-30 NOTE — Assessment & Plan Note (Signed)
Referral to GI  

## 2014-08-30 NOTE — Assessment & Plan Note (Signed)
A: epigastric and RUQ pain with heme positive stools, patient on chronic prednisone 10 mg daily (tapered down from 20 mg daily) for lupus. Suspect gastritis, possible gastric ulcer plus possible lupus flare. R/o pancreatitis which is less likely given lack of fever. She is hemodynamically stable  P:  CBC, CMP, lipase  Increase prilosec to 40 mg daily  Add carafate 4 times daily GI referral  IM depo medrol 80 mg x one today, will keep prednisone at 10 mg daily for now (increase in hematuria and proteinuria).

## 2014-08-30 NOTE — Telephone Encounter (Signed)
Pt aware lab appointment schedule for tomorrow

## 2014-08-31 ENCOUNTER — Other Ambulatory Visit: Payer: Self-pay

## 2014-08-31 LAB — PROTEIN / CREATININE RATIO, URINE
Creatinine, Urine: 249.1 mg/dL
Protein Creatinine Ratio: 0.71 — ABNORMAL HIGH (ref <0.15)
Total Protein, Urine: 177 mg/dL — ABNORMAL HIGH (ref 5–24)

## 2014-09-01 ENCOUNTER — Telehealth: Payer: Self-pay | Admitting: *Deleted

## 2014-09-01 NOTE — Telephone Encounter (Signed)
Pt aware of result and referred  (information was given in BahrainSpanish)

## 2014-09-01 NOTE — Telephone Encounter (Signed)
-----   Message from Lora PaulaJosalyn C Funches, MD sent at 08/31/2014  2:14 PM EST ----- Urine protein and creatine on the rise in the setting of lupus.  Referred to nephrology

## 2014-09-06 ENCOUNTER — Ambulatory Visit: Payer: Self-pay | Attending: Family Medicine

## 2014-09-06 DIAGNOSIS — R1013 Epigastric pain: Secondary | ICD-10-CM

## 2014-09-06 LAB — CBC
HCT: 33.2 % — ABNORMAL LOW (ref 36.0–46.0)
Hemoglobin: 10.8 g/dL — ABNORMAL LOW (ref 12.0–15.0)
MCH: 26.1 pg (ref 26.0–34.0)
MCHC: 32.5 g/dL (ref 30.0–36.0)
MCV: 80.2 fL (ref 78.0–100.0)
MPV: 8.1 fL — AB (ref 8.6–12.4)
Platelets: 706 10*3/uL — ABNORMAL HIGH (ref 150–400)
RBC: 4.14 MIL/uL (ref 3.87–5.11)
RDW: 17.4 % — AB (ref 11.5–15.5)
WBC: 10.1 10*3/uL (ref 4.0–10.5)

## 2014-09-08 ENCOUNTER — Telehealth: Payer: Self-pay | Admitting: *Deleted

## 2014-09-08 ENCOUNTER — Ambulatory Visit: Payer: Self-pay | Admitting: Family Medicine

## 2014-09-08 NOTE — Telephone Encounter (Signed)
Pt aware of lab results (results given in Spanish) 

## 2014-09-08 NOTE — Telephone Encounter (Signed)
-----   Message from Lora PaulaJosalyn C Funches, MD sent at 09/07/2014  9:26 AM EST ----- Drop in Hgb from 12.6 to 10.8 suspect GI bleed vs anemia of chronic disease Continue current treatment plan

## 2014-09-26 ENCOUNTER — Telehealth: Payer: Self-pay | Admitting: Family Medicine

## 2014-09-26 NOTE — Telephone Encounter (Signed)
Pt called requesting medication refill on predniSONE (DELTASONE) 10 MG tablet,omeprazole (PRILOSEC) 40 MG capsule. Please f\u with pt

## 2014-09-29 ENCOUNTER — Ambulatory Visit: Payer: Self-pay | Attending: Family Medicine

## 2014-10-05 ENCOUNTER — Encounter: Payer: Self-pay | Admitting: Family Medicine

## 2014-10-05 ENCOUNTER — Ambulatory Visit: Payer: Self-pay | Attending: Family Medicine | Admitting: Family Medicine

## 2014-10-05 VITALS — BP 143/88 | HR 79 | Temp 98.3°F | Resp 16 | Ht 61.0 in | Wt 104.0 lb

## 2014-10-05 DIAGNOSIS — M3219 Other organ or system involvement in systemic lupus erythematosus: Secondary | ICD-10-CM

## 2014-10-05 DIAGNOSIS — M329 Systemic lupus erythematosus, unspecified: Secondary | ICD-10-CM | POA: Insufficient documentation

## 2014-10-05 DIAGNOSIS — J040 Acute laryngitis: Secondary | ICD-10-CM

## 2014-10-05 DIAGNOSIS — J020 Streptococcal pharyngitis: Secondary | ICD-10-CM

## 2014-10-05 DIAGNOSIS — R634 Abnormal weight loss: Secondary | ICD-10-CM | POA: Insufficient documentation

## 2014-10-05 DIAGNOSIS — J029 Acute pharyngitis, unspecified: Secondary | ICD-10-CM | POA: Insufficient documentation

## 2014-10-05 LAB — POCT RAPID STREP A (OFFICE): RAPID STREP A SCREEN: NEGATIVE

## 2014-10-05 MED ORDER — AMOXICILLIN 875 MG PO TABS: 875.0000 mg | ORAL_TABLET | Freq: Two times a day (BID) | ORAL | Status: DC

## 2014-10-05 MED ORDER — PREDNISONE 10 MG PO TABS: 10.0000 mg | ORAL_TABLET | Freq: Every day | ORAL | Status: DC

## 2014-10-05 MED ORDER — FLUCONAZOLE 150 MG PO TABS: 150.0000 mg | ORAL_TABLET | Freq: Once | ORAL | Status: DC

## 2014-10-05 MED ORDER — OMEPRAZOLE 40 MG PO CPDR: 40.0000 mg | DELAYED_RELEASE_CAPSULE | Freq: Every day | ORAL | Status: DC

## 2014-10-05 NOTE — Progress Notes (Signed)
Complaining of sore throat  x 2week No temperature no body aches

## 2014-10-05 NOTE — Patient Instructions (Addendum)
Ms. Wendy Kaufman,  Laryngitis  Take  Prilosec every evening  Amoxicillin 875 BID x 10 days  Diflucan to prevent yeast after antibiotic Chloraseptic spray or Cepacol lozenges or tylenol for soreness.   F/u in 2 months for lupus, sooner if needed for current laryngitis  Dr. Armen PickupFunches   Laringitis  (Laryngitis)  Es la irritacin, dolor e hinchazn (inflamacin) de las cuerdas vocales. Puede causar ronquera, tos, prdida de la voz, dolor y Neurosurgeonsequedad en la garganta. Las causas pueden ser:  Infeccin.  Fumar demasiado.  Hablar o gritar demasiado.  Respirar humos txicos.  Alergias.  Regurgitacin de cido desde el estmago. CUIDADOS EN EL HOGAR   Beba gran cantidad de lquido para mantener el pis (orina) de tono claro o color amarillo plido.  Descanse hasta que no tenga ms problemas, o segn las indicaciones del mdico.  Trate de respirar aire hmedo.  Tome los medicamentos como le indic el mdico.  No fume.  Hable lo menos posible (esto incluye los susurros).  En vez de hablar, escriba en un papel, hasta que su voz vuelva a la normalidad.  Si el problema no ha mejorado en [address] das, consulte a su mdico. SOLICITE AYUDA SI:   Tiene dificultad para respirar.  Tose y escupe sangre.  Tiene fiebre que no se Burkina Fasoalivia.  Aumenta el dolor.  Presenta dificultad para tragar. ASEGRESE DE QUE:   Comprende estas instrucciones.  Controlar su enfermedad.  Solicitar ayuda de inmediato si no mejora o si empeora. Document Released: 06/27/2011 Document Revised: 09/30/2011 [facility]cExitCare Patient Information 2015 [place]netsvilleExitCare, MarylandLLC. This information is not intended to replace advice given to you by your health care provider. Make sure you discuss any questions you have with your health care provider.

## 2014-10-05 NOTE — Progress Notes (Signed)
   Subjective:    Patient ID: Wendy Kaufman, female    DOB: 1986/03/07, 29 y.o.   MRN: [id number] CC: sore throat  HPI Spanish interpreter [phone]  1. Sore throat: x 2 weeks. No fever. Hoarseness. Dry cough. No sick contacts. No home care.   2. Lupus: ran out of prednisone today. Taking prilosec. Stomach pain from last visit has resolved.no GI bleed. No hematuria or dark urine.   Soc Hx: non smoker  Review of Systems As per HPI     Objective:   Physical Exam BP 143/88 mmHg  Pulse 79  Temp(Src) 98.3 F (36.8 C) (Oral)  Resp 16  Ht 5\' 1"  (1.549 m)  Wt 104 lb (47.174 kg)  BMI 19.66 kg/m2  SpO2 96%  LMP 09/06/2014 General appearance: alert, cooperative and no distress Eyes: conjunctivae/corneas clear. PERRL, EOM's intact.  Ears: normal TM's and external ear canals both ears Nose: Nares normal. Septum midline. Mucosa normal. No drainage or sinus tenderness. Throat: lips, mucosa, and tongue normal; teeth and gums normal Neck: no adenopathy, supple, symmetrical, trachea midline and thyroid not enlarged, symmetric, no tenderness/mass/nodules Lungs: clear to auscultation bilaterally Heart: regular rate and rhythm, S1, S2 normal, no murmur, click, rub or gallop  Rapid strep: negative     Assessment & Plan:

## 2014-10-05 NOTE — Assessment & Plan Note (Signed)
A: laryngitis x 2 weeks in patient taking prednisone 10 daily for lupus,  URI vs GERD P:  Take Protonix every evening  Amoxicillin 875 BID x 10 days  Diflucan to prevent yeast after antibiotic Chloraseptic spray or Cepacol lozenges or tylenol for soreness.

## 2014-10-05 NOTE — Addendum Note (Signed)
Addended by: Dessa PhiFUNCHES, Davidjames Blansett on: 10/05/2014 05:59 PM   Modules accepted: Orders

## 2014-10-21 ENCOUNTER — Encounter: Payer: Self-pay | Admitting: Family Medicine

## 2014-10-21 ENCOUNTER — Ambulatory Visit: Payer: Self-pay | Attending: Family Medicine | Admitting: Family Medicine

## 2014-10-21 VITALS — BP 127/87 | HR 83 | Temp 98.1°F | Resp 16 | Ht 61.0 in | Wt 105.0 lb

## 2014-10-21 DIAGNOSIS — J3489 Other specified disorders of nose and nasal sinuses: Secondary | ICD-10-CM

## 2014-10-21 DIAGNOSIS — M329 Systemic lupus erythematosus, unspecified: Secondary | ICD-10-CM

## 2014-10-21 DIAGNOSIS — J020 Streptococcal pharyngitis: Secondary | ICD-10-CM

## 2014-10-21 DIAGNOSIS — I7789 Other specified disorders of arteries and arterioles: Secondary | ICD-10-CM

## 2014-10-21 DIAGNOSIS — R1013 Epigastric pain: Secondary | ICD-10-CM

## 2014-10-21 DIAGNOSIS — J029 Acute pharyngitis, unspecified: Secondary | ICD-10-CM | POA: Insufficient documentation

## 2014-10-21 LAB — COMPLETE METABOLIC PANEL WITH GFR
ALBUMIN: 3 g/dL — AB (ref 3.5–5.2)
ALT: 11 U/L (ref 0–35)
AST: 19 U/L (ref 0–37)
Alkaline Phosphatase: 78 U/L (ref 39–117)
BILIRUBIN TOTAL: 0.3 mg/dL (ref 0.2–1.2)
BUN: 12 mg/dL (ref 6–23)
CO2: 22 mEq/L (ref 19–32)
Calcium: 8.6 mg/dL (ref 8.4–10.5)
Chloride: 108 mEq/L (ref 96–112)
Creat: 0.4 mg/dL — ABNORMAL LOW (ref 0.50–1.10)
GFR, Est African American: >89 mL/min
GFR, Est Non African American: >89 mL/min
GLUCOSE: 70 mg/dL (ref 70–99)
POTASSIUM: 4.3 meq/L (ref 3.5–5.3)
Sodium: 139 mEq/L (ref 135–145)
TOTAL PROTEIN: 7.6 g/dL (ref 6.0–8.3)

## 2014-10-21 LAB — CBC WITH DIFFERENTIAL/PLATELET
Basophils Absolute: 0 10*3/uL (ref 0.0–0.1)
Basophils Relative: 0 % (ref 0–1)
EOS PCT: 1 % (ref 0–5)
Eosinophils Absolute: 0.1 10*3/uL (ref 0.0–0.7)
HEMATOCRIT: 37.3 % (ref 36.0–46.0)
Hemoglobin: 12.3 g/dL (ref 12.0–15.0)
LYMPHS ABS: 1.4 10*3/uL (ref 0.7–4.0)
LYMPHS PCT: 26 % (ref 12–46)
MCH: 26.2 pg (ref 26.0–34.0)
MCHC: 33 g/dL (ref 30.0–36.0)
MCV: 79.5 fL (ref 78.0–100.0)
MPV: 8.4 fL — ABNORMAL LOW (ref 8.6–12.4)
Monocytes Absolute: 0.4 10*3/uL (ref 0.1–1.0)
Monocytes Relative: 8 % (ref 3–12)
Neutro Abs: 3.5 10*3/uL (ref 1.7–7.7)
Neutrophils Relative %: 65 % (ref 43–77)
Platelets: 674 10*3/uL — ABNORMAL HIGH (ref 150–400)
RBC: 4.69 MIL/uL (ref 3.87–5.11)
RDW: 17.6 % — ABNORMAL HIGH (ref 11.5–15.5)
WBC: 5.4 10*3/uL (ref 4.0–10.5)

## 2014-10-21 LAB — POCT RAPID STREP A (OFFICE): RAPID STREP A SCREEN: NEGATIVE

## 2014-10-21 LAB — LIPASE: LIPASE: 29 U/L (ref 0–75)

## 2014-10-21 MED ORDER — OMEPRAZOLE 40 MG PO CPDR: 40.0000 mg | DELAYED_RELEASE_CAPSULE | Freq: Two times a day (BID) | ORAL | Status: DC

## 2014-10-21 MED ORDER — CETIRIZINE HCL 10 MG PO TABS: 10.0000 mg | ORAL_TABLET | Freq: Every day | ORAL | Status: DC

## 2014-10-21 NOTE — Progress Notes (Signed)
Complaining of sore throat x1 week HA, itching on throat and cough at night    Used PG&E CorporationPacific Interpreted Spanish # [phone]

## 2014-10-21 NOTE — Assessment & Plan Note (Addendum)
A: one month of sore throat with hoarseness and pain with eating. No improvement with amoxillin. Negative rapid strep x 2, negative. Patient on daily prednisone due to lupus P: Increase protonix to BID  GI referral for possible EGD  Throat culture CBC with diff, CMP, lipase

## 2014-10-21 NOTE — Progress Notes (Signed)
   Subjective:    Patient ID: Wendy Kaufman, female    DOB: 02-11-1986, 29 y.o.   MRN: [id number] CC: sore throat x one month   HPI Spanish interpreter on the phone  1. Sore throat: x one month. No fever. No sick contacts. No improvement with amoxicillin x 10 day course. On protonix 40 daily and prednisone 10 mg daily for lupus. Admits to cough in AM. Admits to soreness when swallowing foods. No weight change.   Soc Hx: non smoker  Review of Systems  Constitutional: Negative for fever and unexpected weight change.  HENT: Positive for sinus pressure and sore throat. Negative for congestion.   Respiratory: Positive for cough.   Cardiovascular: Negative.   Gastrointestinal: Negative.   Neurological: Positive for headaches.       Objective:   Physical Exam BP 127/87 mmHg  Pulse 83  Temp(Src) 98.1 F (36.7 C) (Oral)  Resp 16  Ht 5\' 1"  (1.549 m)  Wt 105 lb (47.628 kg)  BMI 19.85 kg/m2  SpO2 97%  LMP 10/05/2014  Wt Readings from Last 3 Encounters:  10/21/14 105 lb (47.628 kg)  10/05/14 104 lb (47.174 kg)  08/30/14 105 lb (47.628 kg)  General appearance: alert, cooperative and no distress, thin  Head: Normocephalic, without obvious abnormality, atraumatic Eyes: conjunctivae/corneas clear. PERRL, EOM's intact.  Ears: normal TM's and external ear canals both ears Nose: no discharge, turbinates normal Throat: lips, mucosa, and tongue normal; teeth and gums normal Neck: no adenopathy, supple, symmetrical, trachea midline and thyroid not enlarged, symmetric, no tenderness/mass/nodules Lungs: clear to auscultation bilaterally Heart: regular rate and rhythm, S1, S2 normal, no murmur, click, rub or gallop Abdomen: soft, non-tender; bowel sounds normal; no masses,  no organomegaly     Assessment & Plan:

## 2014-10-21 NOTE — Assessment & Plan Note (Signed)
A: pain w/o evidence of sinusitis, suspect allergic rhinitis  P:  Zyrtec 10 mg once daily

## 2014-10-21 NOTE — Patient Instructions (Addendum)
Ms. Samson FredericSanchez-Flores,  Thank you for coming in today.  1. Month of sore throat, no improvement with antibiotic therapy. No evidence of infection on exam.  Plan: Increase prilosec to twice daily Labs: CBC with diff, CMP, lipase today, throat culture  Referrals: GI for possible camera study, EGD   2. Forehead headache: Zyrtec 10 mg once daily  F/u in 3-4 weeks for f/u sore throat and pap smear   Dr. Armen PickupFunches

## 2014-11-05 ENCOUNTER — Encounter (HOSPITAL_COMMUNITY): Payer: Self-pay

## 2014-11-05 ENCOUNTER — Emergency Department (HOSPITAL_COMMUNITY)
Admission: EM | Admit: 2014-11-05 | Discharge: 2014-11-05 | Disposition: A | Discharge status: Home / Self Care | Payer: Self-pay | Attending: Emergency Medicine | Admitting: Emergency Medicine

## 2014-11-05 DIAGNOSIS — M791 Myalgia: Secondary | ICD-10-CM | POA: Insufficient documentation

## 2014-11-05 DIAGNOSIS — H9209 Otalgia, unspecified ear: Secondary | ICD-10-CM | POA: Insufficient documentation

## 2014-11-05 DIAGNOSIS — J029 Acute pharyngitis, unspecified: Secondary | ICD-10-CM | POA: Insufficient documentation

## 2014-11-05 DIAGNOSIS — R51 Headache: Secondary | ICD-10-CM | POA: Insufficient documentation

## 2014-11-05 MED ORDER — IBUPROFEN 400 MG PO TABS
600.0000 mg | ORAL_TABLET | Freq: Once | ORAL | Status: AC
  Administered 2014-11-05: 600 mg via ORAL
  Filled 2014-11-05: qty 2

## 2014-11-05 MED ORDER — ACETAMINOPHEN 325 MG PO TABS: 650.0000 mg | ORAL_TABLET | ORAL | Status: DC | PRN

## 2014-11-05 MED ORDER — ACETAMINOPHEN 325 MG PO TABS
650.0000 mg | ORAL_TABLET | Freq: Once | ORAL | Status: AC
  Administered 2014-11-05: 650 mg via ORAL
  Filled 2014-11-05: qty 2

## 2014-11-05 MED ORDER — IBUPROFEN 600 MG PO TABS: 600.0000 mg | ORAL_TABLET | Freq: Four times a day (QID) | ORAL | Status: DC | PRN

## 2014-11-05 NOTE — ED Notes (Signed)
Onset 2 months cough, voice hoarse, sore throat and left ear pain.  Pt seen several weeks ago, took antibiotics, no improvement. No respiratory or swallowing difficulties.

## 2014-11-05 NOTE — ED Notes (Signed)
Declined W/C at D/C and was escorted to lobby by RN. 

## 2014-11-05 NOTE — Discharge Instructions (Signed)
Please follow the directions provided. Be sure to follow-up with your primary care doctor to ensure you're getting better. You may take Tylenol every 4 hours and ibuprofen every 6 hours to help with pain and inflammation. Be sure to stay well hydrated by drinking plenty of fluids by mouth. May use salt water gargles to also help with throat pain. Don't hesitate to return for any new, worsening, or concerning symptoms.   SEEK IMMEDIATE MEDICAL CARE IF:  Your neck becomes stiff.  You drool or are unable to swallow liquids.  You vomit or are unable to keep medicines or liquids down.  You have severe pain that does not go away with the use of recommended medicines.  You have trouble breathing (not caused by a stuffy nose).

## 2014-11-05 NOTE — ED Provider Notes (Signed)
CSN: [id number]     Arrival date & time 11/05/14  1202 History  This chart was scribed for Harle BattiestElizabeth Cayleigh Paull, NP, working with Samuel JesterKathleen McManus, DO by Chestine SporeSoijett Blue, ED Scribe. The patient was seen in room TR07C/TR07C at 12:31 PM.    Chief Complaint  Patient presents with  . Sore Throat  . Otalgia  . Cough      The history is provided by the patient. A language interpreter was used ([place]).    HPI Comments: Wendy Kaufman is a 29 y.o. female who presents to the Emergency Department complaining of sore throat onset 2 months. Pt was seen before for these symptoms and Rx abx 2 weeks ago. Pt did take the whole course of abx and they didn't alleviate her symptoms. The abx didn't make her throw up or itch. Pt notes that these symptoms have been constant with the first symptom being her sore throat. Pt then had ear pain that began 1 month after the sore throat. Pt voice changed 2 months ago when her sore throat began. She states that she is having associated symptoms of  voice change, left ear pain, HA, myalgias. The HA and myalgia began 1 week ago and she had them for a total of 4 days. She reports that those particular symptoms stopped yesterday. She states that she has tried Rx abx with no relief for her symptoms. The pt has not tried any other medications for the relief of her symptoms. She denies trouble swallowing, rhinorrhea, sinus pressure, cough, fever, chills, vomiting, and any other symptoms. Pt does have a PCP.   History reviewed. No pertinent past medical history. History reviewed. No pertinent past surgical history. History reviewed. No pertinent family history. History  Substance Use Topics  . Smoking status: Never Smoker   . Smokeless tobacco: Not on file  . Alcohol Use: No   OB History    No data available     Review of Systems  Constitutional: Negative for fever and chills.  HENT: Positive for ear pain, sore throat and voice change. Negative for rhinorrhea, sinus  pressure and trouble swallowing.   Respiratory: Negative for cough.   Gastrointestinal: Negative for vomiting.  Musculoskeletal: Positive for myalgias.  Neurological: Positive for headaches.      Allergies  Review of patient's allergies indicates no known allergies.  Home Medications   Prior to Admission medications   Not on File   BP 131/88 mmHg  Pulse 96  Temp(Src) 98.1 F (36.7 C) (Oral)  Resp 16  Wt 105 lb 9.6 oz (47.9 kg)  SpO2 98%  LMP 10/05/2014  Physical Exam  Constitutional: She is oriented to person, place, and time. She appears well-developed and well-nourished. No distress.  HENT:  Head: Normocephalic and atraumatic.  Right Ear: Tympanic membrane and ear canal normal.  Left Ear: Tympanic membrane and ear canal normal.  Mouth/Throat: Oropharynx is clear and moist. No oropharyngeal exudate, posterior oropharyngeal edema or posterior oropharyngeal erythema.  No tonsillar swelling noted. No exudate or erythema.   Eyes: EOM are normal.  Neck: Neck supple. No tracheal deviation present.  Cardiovascular: Normal rate.   Pulmonary/Chest: Effort normal and breath sounds normal. No respiratory distress. She has no wheezes. She has no rales.  Musculoskeletal: Normal range of motion.  Neurological: She is alert and oriented to person, place, and time. She has normal strength. No cranial nerve deficit or sensory deficit. GCS eye subscore is 4. GCS verbal subscore is 5. GCS motor subscore is 6.  Skin: Skin is warm and dry.  Psychiatric: She has a normal mood and affect. Her behavior is normal.  Nursing note and vitals reviewed.   ED Course  Procedures (including critical care time) DIAGNOSTIC STUDIES: Oxygen Saturation is 98% on RA, nl by my interpretation.    COORDINATION OF CARE: 12:43 PM-Discussed treatment plan which includes tylenol, ibuprofen, drink plenty fluids, f/u with PCP with pt at bedside and pt agreed to plan.   Labs Review Labs Reviewed - No data to  display  Imaging Review No results found.   EKG Interpretation None      MDM   Final diagnoses:  Pharyngitis   28 yo who is afebrile without tonsillar exudate but ongoing throat and ear pain and hoarse voice. She has no discernable cervical lymphadenopathy and normal TMs bilat. Symptoms most cosistent with viral pharyngitis. Discussed abx not indicated. DC w symptomatic tx for pain including tylenol and ibuprofen.  Pain managed in the ED.  Pt does not appear dehydrated, but did discuss importance of water rehydration. Presentation non concerning for PTA or infxn spread to soft tissue. No trismus or uvula deviation.  Pt able to drink water in ED without difficulty with intact air way. Pt is well-appearing, in no acute distress and vital signs reviewed and not concerning. She appears safe to be discharged.  Discharge include follow-up with their PCP.  Return precautions provided. Pt aware of plan and in agreement.   I personally performed the services described in this documentation, which was scribed in my presence. The recorded information has been reviewed and is accurate.  Filed Vitals:   11/05/14 1207 11/05/14 1258  BP: 131/88 124/87  Pulse: 96 88  Temp: 98.1 F (36.7 C) 98.7 F (37.1 C)  TempSrc: Oral   Resp: 16 16  Weight: 105 lb 9.6 oz (47.9 kg)   SpO2: 98% 99%   Meds given in ED:  Medications  acetaminophen (TYLENOL) tablet 650 mg (650 mg Oral Given 11/05/14 1314)  ibuprofen (ADVIL,MOTRIN) tablet 600 mg (600 mg Oral Given 11/05/14 1314)    Discharge Medication List as of 11/05/2014 12:59 PM    START taking these medications   Details  acetaminophen (TYLENOL) 325 MG tablet Take 2 tablets (650 mg total) by mouth every 4 (four) hours as needed., Starting 11/05/2014, Until Discontinued, Print    ibuprofen (ADVIL,MOTRIN) 600 MG tablet Take 1 tablet (600 mg total) by mouth every 6 (six) hours as needed for fever, headache or moderate pain., Starting 11/05/2014, Until  Discontinued, Print          Harle Battiest, NP 11/06/14 0920  Samuel Jester, DO 11/06/14 1359

## 2014-12-01 ENCOUNTER — Ambulatory Visit: Payer: Self-pay | Attending: Family Medicine | Admitting: Family Medicine

## 2014-12-01 ENCOUNTER — Other Ambulatory Visit: Payer: Self-pay | Admitting: Family Medicine

## 2014-12-01 ENCOUNTER — Encounter: Payer: Self-pay | Admitting: Family Medicine

## 2014-12-01 DIAGNOSIS — I7789 Other specified disorders of arteries and arterioles: Secondary | ICD-10-CM

## 2014-12-01 DIAGNOSIS — I776 Arteritis, unspecified: Secondary | ICD-10-CM | POA: Insufficient documentation

## 2014-12-01 DIAGNOSIS — Z79899 Other long term (current) drug therapy: Secondary | ICD-10-CM | POA: Insufficient documentation

## 2014-12-01 DIAGNOSIS — M329 Systemic lupus erythematosus, unspecified: Secondary | ICD-10-CM | POA: Insufficient documentation

## 2014-12-01 DIAGNOSIS — J029 Acute pharyngitis, unspecified: Secondary | ICD-10-CM | POA: Insufficient documentation

## 2014-12-01 LAB — CBC
HCT: 36.5 % (ref 36.0–46.0)
HEMOGLOBIN: 12 g/dL (ref 12.0–15.0)
MCH: 26.4 pg (ref 26.0–34.0)
MCHC: 32.9 g/dL (ref 30.0–36.0)
MCV: 80.4 fL (ref 78.0–100.0)
MPV: 8.4 fL — ABNORMAL LOW (ref 8.6–12.4)
Platelets: 761 10*3/uL — ABNORMAL HIGH (ref 150–400)
RBC: 4.54 MIL/uL (ref 3.87–5.11)
RDW: 19.2 % — ABNORMAL HIGH (ref 11.5–15.5)
WBC: 4.5 10*3/uL (ref 4.0–10.5)

## 2014-12-01 LAB — COMPLETE METABOLIC PANEL WITH GFR
ALT: 14 U/L (ref 0–35)
AST: 15 U/L (ref 0–37)
Albumin: 2.7 g/dL — ABNORMAL LOW (ref 3.5–5.2)
Alkaline Phosphatase: 67 U/L (ref 39–117)
BILIRUBIN TOTAL: 0.2 mg/dL (ref 0.2–1.2)
BUN: 14 mg/dL (ref 6–23)
CALCIUM: 8.6 mg/dL (ref 8.4–10.5)
CO2: 21 mEq/L (ref 19–32)
CREATININE: 0.44 mg/dL — AB (ref 0.50–1.10)
Chloride: 108 mEq/L (ref 96–112)
GFR, Est African American: >89 mL/min
GFR, Est Non African American: >89 mL/min
Glucose, Bld: 72 mg/dL (ref 70–99)
Potassium: 4 mEq/L (ref 3.5–5.3)
Sodium: 138 mEq/L (ref 135–145)
Total Protein: 6.8 g/dL (ref 6.0–8.3)

## 2014-12-01 MED ORDER — PREDNISONE 10 MG PO TABS: 10.0000 mg | ORAL_TABLET | Freq: Every day | ORAL | Status: DC

## 2014-12-01 MED ORDER — OMEPRAZOLE 40 MG PO CPDR: 40.0000 mg | DELAYED_RELEASE_CAPSULE | Freq: Two times a day (BID) | ORAL | Status: DC

## 2014-12-01 NOTE — Progress Notes (Signed)
F/U sore throat  No changes since last visit  No pain

## 2014-12-01 NOTE — Progress Notes (Signed)
   Subjective:    Patient ID: Wendy Kaufman, female    DOB: 08-20-1985, 29 y.o.   MRN: [id number] CC: hoarseness  Spanish interpreter present  HPI [age] yo Hispanic female with lupus   1. Hoarseness: x 2 months now. Strep negative x 2. No improvement with PPI or amoxicillin. No pain, cough, SOB, neck swelling, trouble swallowing.   2. Lupus: missed f/u rheumatology appt scheduled for yesterday due to lack of transportation. Out of prednisone.   Soc Hx: non smoker  Review of Systems  Constitutional: Negative for chills and unexpected weight change.  HENT: Positive for voice change. Negative for sore throat and trouble swallowing.        Hoarseness  Musculoskeletal: Negative for arthralgias.       Objective:   Physical Exam BP 147/92 mmHg  Pulse 77  Temp(Src) 98 F (36.7 C) (Oral)  Resp 16  Ht 5\' 1"  (1.549 m)  Wt 104 lb (47.174 kg)  BMI 19.66 kg/m2  SpO2 98%  LMP 11/03/2014  Wt Readings from Last 3 Encounters:  12/01/14 104 lb (47.174 kg)  10/21/14 105 lb (47.628 kg)  10/05/14 104 lb (47.174 kg)  General appearance: alert, cooperative and no distress Throat: lips, mucosa, and tongue normal; teeth and gums normal Neck: no adenopathy, supple, symmetrical, trachea midline and thyroid not enlarged, symmetric, no tenderness/mass/nodules Lungs: clear to auscultation bilaterally Heart: regular rate and rhythm, S1, S2 normal, no murmur, click, rub or gallop       Assessment & Plan:

## 2014-12-01 NOTE — Patient Instructions (Addendum)
   1. Lupus:  Rescheduled rheumatology appt with Dr. Nydia BoutonLuk  Wednesday, May 18 at 2:30 PM [address] [place], [place] [address] Phone # for clinic: [phone] Refilled prednisone   2. Sore throat with hoarseness: Continue prilosec ENT referral placed  F/u in 2 months for hoarseness   Dr. Armen PickupFunches

## 2014-12-01 NOTE — Assessment & Plan Note (Signed)
Lupus: Rescheduled rheumatology appt with Dr. Nydia BoutonLuk  Wednesday, May 18 at 2:30 PM [address] [place], [place] [address] Phone # for clinic: [phone] Refilled prednisone

## 2014-12-01 NOTE — Assessment & Plan Note (Signed)
Sore throat with hoarseness: Continue prilosec ENT referral placed

## 2014-12-08 ENCOUNTER — Emergency Department (HOSPITAL_COMMUNITY): Payer: Medicaid Other

## 2014-12-08 ENCOUNTER — Encounter (HOSPITAL_COMMUNITY): Payer: Self-pay | Admitting: *Deleted

## 2014-12-08 ENCOUNTER — Inpatient Hospital Stay (HOSPITAL_COMMUNITY)
Admission: EM | Admit: 2014-12-08 | Discharge: 2014-12-09 | DRG: 547 | Disposition: A | Discharge status: Other Acute Inpatient Hospital | Payer: Medicaid Other | Attending: Internal Medicine | Admitting: Internal Medicine

## 2014-12-08 DIAGNOSIS — D649 Anemia, unspecified: Secondary | ICD-10-CM | POA: Diagnosis present

## 2014-12-08 DIAGNOSIS — R109 Unspecified abdominal pain: Secondary | ICD-10-CM

## 2014-12-08 DIAGNOSIS — R509 Fever, unspecified: Secondary | ICD-10-CM | POA: Diagnosis present

## 2014-12-08 DIAGNOSIS — M3214 Glomerular disease in systemic lupus erythematosus: Principal | ICD-10-CM | POA: Diagnosis present

## 2014-12-08 DIAGNOSIS — R079 Chest pain, unspecified: Secondary | ICD-10-CM | POA: Diagnosis present

## 2014-12-08 DIAGNOSIS — D509 Iron deficiency anemia, unspecified: Secondary | ICD-10-CM | POA: Diagnosis present

## 2014-12-08 DIAGNOSIS — M329 Systemic lupus erythematosus, unspecified: Secondary | ICD-10-CM

## 2014-12-08 LAB — URINALYSIS, ROUTINE W REFLEX MICROSCOPIC
Bilirubin Urine: NEGATIVE
Glucose, UA: NEGATIVE mg/dL
Ketones, ur: NEGATIVE mg/dL
Nitrite: NEGATIVE
Protein, ur: >300 mg/dL — AB
Specific Gravity, Urine: 1.021 (ref 1.005–1.030)
Urobilinogen, UA: 1 mg/dL (ref 0.0–1.0)
pH: 6.5 (ref 5.0–8.0)

## 2014-12-08 LAB — CBC
HEMATOCRIT: 34.6 % — AB (ref 36.0–46.0)
Hemoglobin: 11.4 g/dL — ABNORMAL LOW (ref 12.0–15.0)
MCH: 26.6 pg (ref 26.0–34.0)
MCHC: 32.9 g/dL (ref 30.0–36.0)
MCV: 80.8 fL (ref 78.0–100.0)
Platelets: 530 10*3/uL — ABNORMAL HIGH (ref 150–400)
RBC: 4.28 MIL/uL (ref 3.87–5.11)
RDW: 19.3 % — AB (ref 11.5–15.5)
WBC: 8.7 10*3/uL (ref 4.0–10.5)

## 2014-12-08 LAB — COMPREHENSIVE METABOLIC PANEL
ALK PHOS: 63 U/L (ref 38–126)
ALT: 12 U/L — AB (ref 14–54)
AST: 18 U/L (ref 15–41)
Albumin: 2.8 g/dL — ABNORMAL LOW (ref 3.5–5.0)
Anion gap: 7 (ref 5–15)
BILIRUBIN TOTAL: 0.7 mg/dL (ref 0.3–1.2)
BUN: 10 mg/dL (ref 6–20)
CO2: 23 mmol/L (ref 22–32)
CREATININE: 0.49 mg/dL (ref 0.44–1.00)
Calcium: 8.3 mg/dL — ABNORMAL LOW (ref 8.9–10.3)
Chloride: 108 mmol/L (ref 101–111)
GFR calc Af Amer: >60 mL/min (ref >60)
GFR calc non Af Amer: >60 mL/min (ref >60)
Glucose, Bld: 84 mg/dL (ref 65–99)
Potassium: 3 mmol/L — ABNORMAL LOW (ref 3.5–5.1)
Sodium: 138 mmol/L (ref 135–145)
TOTAL PROTEIN: 7.4 g/dL (ref 6.5–8.1)

## 2014-12-08 LAB — RAPID STREP SCREEN (MED CTR MEBANE ONLY): Streptococcus, Group A Screen (Direct): NEGATIVE

## 2014-12-08 LAB — URINE MICROSCOPIC-ADD ON

## 2014-12-08 LAB — LIPASE, BLOOD: Lipase: 26 U/L (ref 22–51)

## 2014-12-08 LAB — I-STAT TROPONIN, ED: TROPONIN I, POC: 0 ng/mL (ref 0.00–0.08)

## 2014-12-08 LAB — POC URINE PREG, ED: Preg Test, Ur: NEGATIVE

## 2014-12-08 MED ORDER — POTASSIUM CHLORIDE 10 MEQ/100ML IV SOLN
10.0000 meq | Freq: Once | INTRAVENOUS | Status: AC
  Administered 2014-12-08: 10 meq via INTRAVENOUS
  Filled 2014-12-08: qty 100

## 2014-12-08 MED ORDER — HYDROMORPHONE HCL 1 MG/ML IJ SOLN
1.0000 mg | Freq: Once | INTRAMUSCULAR | Status: AC
  Administered 2014-12-08: 1 mg via INTRAVENOUS
  Filled 2014-12-08: qty 1

## 2014-12-08 MED ORDER — SODIUM CHLORIDE 0.9 % IV BOLUS (SEPSIS)
1000.0000 mL | Freq: Once | INTRAVENOUS | Status: AC
  Administered 2014-12-08: 1000 mL via INTRAVENOUS

## 2014-12-08 NOTE — ED Notes (Signed)
Pt with hx of lupus sent here by her pcp for chest pain an sob since yesterday.  Tachycardic and afebrile.  L sided chest pain increases with movement and breathing.

## 2014-12-08 NOTE — ED Provider Notes (Signed)
CSN: [id number]     Arrival date & time 12/08/14  1807 History   First MD Initiated Contact with Patient 12/08/14 1919     Chief Complaint  Patient presents with  . Chest Pain  . Shortness of Breath    HPI   29 year old female presents to the ED with numerous complaints at the request of Rheumatologist Dr. Cory MunchFrancis Cheuk Yin Luk . Patient is Spanish-speaking, interpreter used.  Patient notes that she's had bilateral flank pain for the past 2 days, sore throat times one day, fever 1 day, abdominal pain 2 days, and rash to the soles of her feet for the last 2 weeks. Pt states that she was diagnosed with lupus and instructed to follow up in the emergency room. Patient was able to present a AVS from her encounter on 12/07/2014, it has a diagnosis of lupus with no relevant information, it is uncertain if this is a new diagnosis or standing diagnosis. No results were given on the AVS that would indicate a need for emergent evaluation; uncertain to the specific nature of her emergent visit. Patient describes her flank pain as "feeling like her back is breaking", she describes this as bilateral. She reports the sore throat has been present for months, chart review shows that she was seen and 11/05/2014 here in the emergency room with a diagnosis of pharyngitis, notes indicate that she was noncompliant with her medication. Patient reports the sore throat has come back again over the last couple of days. Patient additionally notes a painful rash to the soles of her feet, she denies any other areas of her body that are affected. Patient has chest pain, over her anterior chest wall, worse with palpation; patient reports that she is short of breath with the addition of the chest pain, also notes diffuse body pain worse in her bilateral flanks. Patient denies changes in urinary color characteristics or frequency, normal bowel movements.    Past Medical History  Diagnosis Date  . Lupus    History reviewed. No  pertinent past surgical history. No family history on file. History  Substance Use Topics  . Smoking status: Never Smoker   . Smokeless tobacco: Not on file  . Alcohol Use: No   OB History    No data available     Review of Systems  All other systems reviewed and are negative.   Allergies  Review of patient's allergies indicates no known allergies.  Home Medications   Prior to Admission medications   Medication Sig Start Date End Date Taking? Authorizing Provider  acetaminophen (TYLENOL) 325 MG tablet Take 2 tablets (650 mg total) by mouth every 4 (four) hours as needed. 11/05/14   Harle BattiestElizabeth Tysinger, NP  ibuprofen (ADVIL,MOTRIN) 600 MG tablet Take 1 tablet (600 mg total) by mouth every 6 (six) hours as needed for fever, headache or moderate pain. 11/05/14   Harle BattiestElizabeth Tysinger, NP   BP 138/95 mmHg  Pulse 118  Temp(Src) 98.6 F (37 C) (Oral)  Resp 18  SpO2 100%  LMP 11/03/2014   Physical Exam  Constitutional: She is oriented to person, place, and time. She appears well-developed and well-nourished.  HENT:  Head: Normocephalic and atraumatic.  Eyes: Conjunctivae are normal. Pupils are equal, round, and reactive to light. Right eye exhibits no discharge. Left eye exhibits no discharge. No scleral icterus.  Neck: Normal range of motion. Neck supple. No JVD present. No tracheal deviation present.  Cardiovascular: Regular rhythm, normal heart sounds and intact distal pulses.  Pulmonary/Chest: Effort normal. No stridor. No respiratory distress. She has no wheezes. She has rales.  Patient is tender throughout her entire anterior chest no signs of trauma or rash  Abdominal: Soft. There is no tenderness. There is CVA tenderness. There is no rebound and no guarding.  Mildly uncomfortable diffusely to palpation of the abdomen  Musculoskeletal:  Legs are equal bilateral no signs of edema or swelling  Neurological: She is alert and oriented to person, place, and time. Coordination  normal.  Skin:  Patient is tender along the soles of her feet with  Purpura, no swelling or splinter hemorrhages noted  Psychiatric: She has a normal mood and affect. Her behavior is normal. Judgment and thought content normal.  Nursing note and vitals reviewed.   ED Course  Procedures (including critical care time) Labs Review Labs Reviewed  CBC - Abnormal; Notable for the following:    Hemoglobin 11.4 (*)    HCT 34.6 (*)    RDW 19.3 (*)    Platelets 530 (*)    All other components within normal limits  COMPREHENSIVE METABOLIC PANEL  I-STAT TROPOININ, ED  POC URINE PREG, ED    Imaging Review No results found.   EKG Interpretation   Date/Time:  Thursday Dec 08 2014 18:49:25 EDT Ventricular Rate:  122 PR Interval:  158 QRS Duration: 82 QT Interval:  280 QTC Calculation: 399 R Axis:   172 Text Interpretation:  Sinus tachycardia Right axis deviation Cannot rule  out Anterior infarct , age undetermined Abnormal ECG no STEMI. Confirmed  by Donnald GarrePfeiffer, MD, Lebron ConnersMarcy [phone]([id number]) on 12/08/2014 8:35:22 PM      MDM   Final diagnoses:  Lupus  Acute flank pain  Fever, unspecified fever cause    Labs: Urine pregnant, urinalysis, i-STAT troponin, CBC, CMP, lipase, blood cultures, rapid strep - significant for  Hypokalemia, granular urine casts, proteinuria   Imaging: DG chest 2 view showing small bilateral pleural effusions vascular congestion, with increased interstitial markings concerning for mild interstitial edema  Consults: Hospitalist  Therapeutics: Dilaudid, normal saline,   Assessment: Lupus, flank pain, fever  Plan: 29 year old female presents today at the request of her rheumatologist for suspected lupus flare. Upon evaluation patient is uncomfortable, reports history of fever, sore throat, chest pain, severe bilateral flank/back pain. She reports using ibuprofen earlier in the day, she afebrile during evaluation, but persistently tachycardic, with tachypnea, normal blood  pressure, she is not hypoxic. Given her significant past medical history of lupus this could likely represent a lupus flare, causing fever that is able to manage with antipyretics. She does have granular casts that could represent breakdown of cellular casts which is common with renal involvement of SLE, she also has proteinuria. Patient has interstitial edema that could likely be caused by the lupus from pneumonitis/pleuritis, it is uncertain if this is from an infectious etiology at this time. Patient was able to produce prescriptions today for prednisone, palquinel, vitamin D there were written by her rheumatologist yesterday the 18th 2016. [facility] for [facility] for further evaluation and management. They agreed to admit patient for further workup as her presentation was concerning and at this point uncertain as to the etiology. Pt care was discussed with Arby BarretteMarcy Pfeiffer MD who agreed to my evaluation, management and plan.           Eyvonne MechanicJeffrey Logen Fowle, PA-C 12/10/14 0159  Arby BarretteMarcy Pfeiffer, MD 12/13/14 [phone]

## 2014-12-08 NOTE — ED Notes (Addendum)
In patient's room with pt and interpreter phone to update pt on plan and condition. Pt has a hoarse voice and I asked her how long she had had the hoarsenss and she said almost 3 months and that she has been having a sore throat, pain when swallowing since. But denies cough again.

## 2014-12-08 NOTE — ED Notes (Signed)
Per interpreter phone, pt states she is here because she went to see a specialist but was sent here "because I have lupus and for my kidneys" they called the pt and stated that she needed to come to the ER to get treatment. Complains of back pain(bilateral flank pain), left sided nonradiating chest pain and generalized body aches. Pt states that she had a fever yesterday. Pt states that her chest pain/back pain is 10/10. Pt denies n/v/d or any recent illness. Pt denies any problems with urination or defecating.

## 2014-12-09 ENCOUNTER — Inpatient Hospital Stay (HOSPITAL_COMMUNITY): Payer: Medicaid Other

## 2014-12-09 ENCOUNTER — Other Ambulatory Visit: Payer: Self-pay

## 2014-12-09 ENCOUNTER — Encounter (HOSPITAL_COMMUNITY): Payer: Self-pay | Admitting: Internal Medicine

## 2014-12-09 DIAGNOSIS — M3214 Glomerular disease in systemic lupus erythematosus: Secondary | ICD-10-CM | POA: Diagnosis present

## 2014-12-09 DIAGNOSIS — M329 Systemic lupus erythematosus, unspecified: Secondary | ICD-10-CM | POA: Diagnosis not present

## 2014-12-09 DIAGNOSIS — R079 Chest pain, unspecified: Secondary | ICD-10-CM

## 2014-12-09 DIAGNOSIS — D509 Iron deficiency anemia, unspecified: Secondary | ICD-10-CM | POA: Diagnosis present

## 2014-12-09 DIAGNOSIS — D649 Anemia, unspecified: Secondary | ICD-10-CM

## 2014-12-09 DIAGNOSIS — R509 Fever, unspecified: Secondary | ICD-10-CM | POA: Diagnosis present

## 2014-12-09 LAB — RAPID URINE DRUG SCREEN, HOSP PERFORMED
AMPHETAMINES: NOT DETECTED
Barbiturates: NOT DETECTED
Benzodiazepines: NOT DETECTED
Cocaine: NOT DETECTED
Opiates: POSITIVE — AB
TETRAHYDROCANNABINOL: NOT DETECTED

## 2014-12-09 LAB — D-DIMER, QUANTITATIVE: D-Dimer, Quant: 4.15 ug/mL-FEU — ABNORMAL HIGH (ref 0.00–0.48)

## 2014-12-09 LAB — BASIC METABOLIC PANEL
Anion gap: 5 (ref 5–15)
BUN: 9 mg/dL (ref 6–20)
CHLORIDE: 106 mmol/L (ref 101–111)
CO2: 22 mmol/L (ref 22–32)
CREATININE: 0.53 mg/dL (ref 0.44–1.00)
Calcium: 7.7 mg/dL — ABNORMAL LOW (ref 8.9–10.3)
GFR calc Af Amer: >60 mL/min (ref >60)
GFR calc non Af Amer: >60 mL/min (ref >60)
GLUCOSE: 76 mg/dL (ref 65–99)
Potassium: 3.4 mmol/L — ABNORMAL LOW (ref 3.5–5.1)
Sodium: 133 mmol/L — ABNORMAL LOW (ref 135–145)

## 2014-12-09 LAB — RETICULOCYTES
RBC.: 4.09 MIL/uL (ref 3.87–5.11)
RETIC COUNT ABSOLUTE: 32.7 10*3/uL (ref 19.0–186.0)
Retic Ct Pct: 0.8 % (ref 0.4–3.1)

## 2014-12-09 LAB — IRON AND TIBC
IRON: 6 ug/dL — AB (ref 28–170)
Saturation Ratios: 3 % — ABNORMAL LOW (ref 10.4–31.8)
TIBC: 175 ug/dL — ABNORMAL LOW (ref 250–450)
UIBC: 169 ug/dL

## 2014-12-09 LAB — CBC
HEMATOCRIT: 33.1 % — AB (ref 36.0–46.0)
Hemoglobin: 11 g/dL — ABNORMAL LOW (ref 12.0–15.0)
MCH: 26.9 pg (ref 26.0–34.0)
MCHC: 33.2 g/dL (ref 30.0–36.0)
MCV: 80.9 fL (ref 78.0–100.0)
PLATELETS: 533 10*3/uL — AB (ref 150–400)
RBC: 4.09 MIL/uL (ref 3.87–5.11)
RDW: 19.2 % — AB (ref 11.5–15.5)
WBC: 7.6 10*3/uL (ref 4.0–10.5)

## 2014-12-09 LAB — INFLUENZA PANEL BY PCR (TYPE A & B)
H1N1FLUPCR: NOT DETECTED
Influenza A By PCR: NEGATIVE
Influenza B By PCR: NEGATIVE

## 2014-12-09 LAB — C-REACTIVE PROTEIN: CRP: 9.8 mg/dL — AB (ref <1.0)

## 2014-12-09 LAB — I-STAT CG4 LACTIC ACID, ED: Lactic Acid, Venous: 0.52 mmol/L (ref 0.5–2.0)

## 2014-12-09 LAB — TROPONIN I: Troponin I: <0.03 ng/mL (ref <0.031)

## 2014-12-09 LAB — TSH: TSH: 1.306 u[IU]/mL (ref 0.350–4.500)

## 2014-12-09 LAB — PROCALCITONIN: Procalcitonin: <0.1 ng/mL

## 2014-12-09 LAB — SEDIMENTATION RATE: Sed Rate: 15 mm/hr (ref 0–22)

## 2014-12-09 LAB — FOLATE: FOLATE: 11.5 ng/mL (ref >5.9)

## 2014-12-09 LAB — FERRITIN: Ferritin: 101 ng/mL (ref 11–307)

## 2014-12-09 LAB — VITAMIN B12: Vitamin B-12: 262 pg/mL (ref 180–914)

## 2014-12-09 LAB — HIV ANTIBODY (ROUTINE TESTING W REFLEX): HIV SCREEN 4TH GENERATION: NONREACTIVE

## 2014-12-09 LAB — T4, FREE: Free T4: 1.47 ng/dL — ABNORMAL HIGH (ref 0.61–1.12)

## 2014-12-09 LAB — BRAIN NATRIURETIC PEPTIDE: B NATRIURETIC PEPTIDE 5: 105.7 pg/mL — AB (ref 0.0–100.0)

## 2014-12-09 MED ORDER — ACETAMINOPHEN 650 MG RE SUPP
650.0000 mg | Freq: Four times a day (QID) | RECTAL | Status: DC | PRN
Start: 2014-12-09 — End: 2014-12-09

## 2014-12-09 MED ORDER — MORPHINE SULFATE 2 MG/ML IJ SOLN
1.0000 mg | Freq: Once | INTRAMUSCULAR | Status: AC
  Administered 2014-12-09: 1 mg via INTRAVENOUS
  Filled 2014-12-09: qty 1

## 2014-12-09 MED ORDER — POTASSIUM CHLORIDE CRYS ER 20 MEQ PO TBCR
40.0000 meq | EXTENDED_RELEASE_TABLET | Freq: Once | ORAL | Status: AC
  Administered 2014-12-09: 40 meq via ORAL
  Filled 2014-12-09: qty 2

## 2014-12-09 MED ORDER — SODIUM CHLORIDE 0.9 % IV SOLN
1000.0000 mg | Freq: Every day | INTRAVENOUS | Status: DC
  Administered 2014-12-09: 1000 mg via INTRAVENOUS
  Filled 2014-12-09 (×2): qty 8

## 2014-12-09 MED ORDER — SODIUM CHLORIDE 0.9 % IV SOLN: 1000.0000 mg | Freq: Every day | INTRAVENOUS | Status: DC

## 2014-12-09 MED ORDER — ONDANSETRON HCL 4 MG PO TABS: 4.0000 mg | ORAL_TABLET | Freq: Four times a day (QID) | ORAL | Status: DC | PRN

## 2014-12-09 MED ORDER — ONDANSETRON HCL 4 MG/2ML IJ SOLN: 4.0000 mg | Freq: Four times a day (QID) | INTRAMUSCULAR | Status: DC | PRN

## 2014-12-09 MED ORDER — IOHEXOL 350 MG/ML SOLN
80.0000 mL | Freq: Once | INTRAVENOUS | Status: AC | PRN
  Administered 2014-12-09: 65 mL via INTRAVENOUS

## 2014-12-09 MED ORDER — ACETAMINOPHEN 325 MG PO TABS: 650.0000 mg | ORAL_TABLET | Freq: Four times a day (QID) | ORAL | Status: DC | PRN

## 2014-12-09 MED ORDER — SODIUM CHLORIDE 0.9 % IJ SOLN
3.0000 mL | Freq: Two times a day (BID) | INTRAMUSCULAR | Status: DC
  Administered 2014-12-09: 3 mL via INTRAVENOUS

## 2014-12-09 NOTE — H&P (Addendum)
[facility] History and Physical  Wendy Kaufman ZOX:[id number]:[id number] DOB: 04-30-1986 DOA: 12/08/2014  Referring physician: Mr.Hedges. PCP: No PCP Per Patient  Specialists: Dr.Frances Cheuk Lance BoschYin Luk can be reaches at [phone].  Engineer, structuralpanish translator used.  Chief Complaint: Was advised to come to the ER because of abnormal labs.  HPI: Wendy Kaufman is a 29 y.o. female with recent diagnosis of lupus as per the patient was advised to come to the ER because of abnormal labs. Patient states that patient has been having some sore throat like symptoms with generalized body ache over the last 1 week and was referred to [facility] who had prescribed her prednisone and plaque and no for possible lupus yesterday and also had some blood works done and was instructed to come to the ER immediately later as blood work showed some Lupus affecting the kidneys. Patient was found to be mildly febrile and tachycardic and was complaining of generalized body ache. Patient also complained of pleuritic type chest pain which has been ongoing for last 5 days. Patient had sore throat like feeling last month and was prescribed over-the-counter medication follow with patient states it improved. So far EKG was showing sinus tachycardia with cardiac markers negative patient mildly febrile chest x-ray showing mild pleural effusions bilaterally. UA showing proteinuria.  Review of Systems: As presented in the history of presenting illness, rest negative.  Past Medical History  Diagnosis Date  . Lupus    History reviewed. No pertinent past surgical history. Social History:  reports that she has never smoked. She does not have any smokeless tobacco history on file. She reports that she does not drink alcohol or use illicit drugs. Where does patient live home. Can patient participate in ADLs? Yes.  No Known Allergies  Family History:  Family History  Problem Relation Age of Onset  . Lupus  Neg Hx       Prior to Admission medications   Medication Sig Start Date End Date Taking? Authorizing Provider  acetaminophen (TYLENOL) 325 MG tablet Take 2 tablets (650 mg total) by mouth every 4 (four) hours as needed. 11/05/14  Yes Harle BattiestElizabeth Tysinger, NP  ibuprofen (ADVIL,MOTRIN) 600 MG tablet Take 1 tablet (600 mg total) by mouth every 6 (six) hours as needed for fever, headache or moderate pain. 11/05/14  Yes Harle BattiestElizabeth Tysinger, NP    Physical Exam: Filed Vitals:   12/09/14 0000 12/09/14 0015 12/09/14 0030 12/09/14 0106  BP: 126/86 124/84 129/89 135/88  Pulse: 115 123 111 130  Temp:    99.8 F (37.7 C)  TempSrc:    Oral  Resp: 21 24 22 20   Height:    5\' 1"  (1.549 m)  Weight:    48.671 kg (107 lb 4.8 oz)  SpO2: 95% 94% 94% 93%     General:  Moderately built and poorly nourished.  Eyes: Anicteric no pallor.  ENT: No discharge from the ears eyes nose and mouth.  Neck: No mass felt. No JVD appreciated.  Cardiovascular: S1-S2 heard.  Respiratory: No rhonchi or crepitations.  Abdomen: Soft nontender bowel sounds present.  Skin: No rash.  Musculoskeletal: No edema.  Psychiatric: Appears normal.  Neurologic: Alert awake oriented to time place and person. Moves all extremities.  Labs on Admission:  Basic Metabolic Panel:  Recent Labs Lab 12/08/14 1918  NA 138  K 3.0*  CL 108  CO2 23  GLUCOSE 84  BUN 10  CREATININE 0.49  CALCIUM 8.3*   Liver Function Tests:  Recent Labs Lab 12/08/14 1918  AST 18  ALT 12*  ALKPHOS 63  BILITOT 0.7  PROT 7.4  ALBUMIN 2.8*    Recent Labs Lab 12/08/14 1918  LIPASE 26   No results for input(s): AMMONIA in the last 168 hours. CBC:  Recent Labs Lab 12/08/14 1918  WBC 8.7  HGB 11.4*  HCT 34.6*  MCV 80.8  PLT 530*   Cardiac Enzymes: No results for input(s): CKTOTAL, CKMB, CKMBINDEX, TROPONINI in the last 168 hours.  BNP (last 3 results) No results for input(s): BNP in the last 8760 hours.  ProBNP (last 3  results) No results for input(s): PROBNP in the last 8760 hours.  CBG: No results for input(s): GLUCAP in the last 168 hours.  Radiological Exams on Admission: Dg Chest 2 View  12/08/2014   CLINICAL DATA:  Acute onset of left-sided chest pain. Generalized body aches and bilateral flank pain. Initial encounter.  EXAM: CHEST  2 VIEW  COMPARISON:  None.  FINDINGS: The lungs are well-aerated. Small bilateral pleural effusions are noted. Vascular congestion is noted, with increased interstitial markings, concerning for mild interstitial edema. No pneumothorax is seen.  A 2.6 cm nodular opacity is noted at the left lung base. This may reflect the nipple shadow.  The heart is mildly enlarged. No acute osseous abnormalities are seen.  IMPRESSION: 1. Small bilateral pleural effusions noted. Vascular congestion, with increased interstitial markings, concerning for mild interstitial edema. 2. 2.6 cm nodular opacity at the left lung base. This may reflect the nipple shadow; a mass is considered unlikely given the patient's age. Would suggest repeat chest radiograph with nipple markers.   Electronically Signed   By: Roanna RaiderJeffery  Chang M.D.   On: 12/08/2014 20:28    EKG: Independently reviewed. Sinus tachycardia.  Assessment/Plan Principal Problem:   Chest pain Active Problems:   Fever   Normocytic anemia   Pyrexia   1. Chest pain with generalized body ache and mild fever with chest x-ray showing pleural effusion and proteinuria - at this time sedimentation rate is normal. I have ordered a 2-D echo d-dimer and BNP for calcitonin levels blood cultures have been ordered and we will cycle cardiac markers.C3 C4 are pending. Patient's urine does show proteinuria. Patient was prescribed prednisone and Plaquenil which I have not started yet. We will need to discuss with patient's rheumatologist Dr. Nydia BoutonLUK in the morning at [phone]. Patient states she was recently diagnosed with lupus.  2. Normocytic anemia - check  anemia panel and follow CBC.    DVT ProphylaSCDs. Code Status: full code. Family Communication: Discussed with patient.  Disposition Plan: Admit to inpatient.    Arvie Bartholomew N. [facility] Pager [phone].  If 7PM-7AM, please contact night-coverage [email or web address] Password [email or web address] 12/09/2014, 4:01 AM

## 2014-12-09 NOTE — Progress Notes (Signed)
Admitted pt from the ED per stretcher assisted by the RN Caryn BeeKevin. AOx4, compalining of chest pressure pain 10/10 and R and L Flank pain 10/10. Denies nausea and vomiting, not in respiratory distress. Dr. Louis MeckelKakakrandy informed of this admission and ordered for Morphine 1mg  IV and EKG. Will continue to monitor pt

## 2014-12-09 NOTE — Progress Notes (Signed)
Droplet precautions discontinued. FLU PCR negative.

## 2014-12-09 NOTE — Progress Notes (Signed)
Interpreter Wyvonnia DuskyGraciela Namihira for [employer]

## 2014-12-09 NOTE — Progress Notes (Signed)
[facility]  Report called to charge nurse Jen Mowrystal Thompson.  [facility]. translator was called in to give information to patient. Patient understands. Pt will call family when she [facility].

## 2014-12-09 NOTE — ED Notes (Signed)
Patient being transported to new room assignment.

## 2014-12-09 NOTE — ED Notes (Signed)
Patient asked for and received a Happy Meal and Coke.

## 2014-12-09 NOTE — Discharge Summary (Signed)
Physician Discharge Summary  Wendy Kaufman ZOX:[id number] DOB: 10-Feb-1986 DOA: 12/08/2014  PCP: No PCP Per Patient  Admit date: 12/08/2014 Discharge date: 12/09/2014  Time spent: 65 minutes  Recommendations for Outpatient Follow-up:  1. 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 She will be admitted under the Gen. 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 female with recent diagnosis of lupus as per the patient was advised to come to the ER because of abnormal labs. Patient stated that patient has been having some sore throat like symptoms with generalized body ache over the last 1 week and was referred [facility] who had prescribed her prednisone and plaquanil for possible lupus yesterday and also had some blood works done and was instructed to come to the ER immediately later as blood work showed some Lupus affecting the kidneys. Patient was found to be mildly febrile and tachycardic and was complaining of generalized body ache. Patient also complained of pleuritic type chest pain which has been ongoing for last 5 days. Patient had sore throat like symptoms last month and was prescribed over-the-counter medication following with patient states it improved. So far EKG showed sinus tachycardia with cardiac markers negative patient mildly febrile chest x-ray showing mild pleural effusions bilaterally. UA showing proteinuria.  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 Fabio AsaFrancis Luk who stated that patient was sent to the hospital secondary to an abnormal urine protein creatinine ratio with concerns for lupus nephritis. Patient's rheumatologist stated that patient was initially seen and diagnosed with lupus recently. Lab work from 08/31/2014 showed a urine protein creatinine ratio of 514 mg. Labs from 12/07/2014 showed a urine protein creatinine ratio of 3158 mg. Patient was noted also to have low complement levels with a C4 level of less than 8 on 12/07/2014 as well as his C3 level of 35. Was also noted that patient did have a sedimentation rate of 32. CRP obtained here was elevated at 9.8. Basic metabolic profile obtained at a sodium of 133 potassium of 3.4 BUN of 9 creatinine of 0.53 calcium was 7.7 otherwise was within normal limits. Patient's rheumatologist had recommended patient receive some pulse dose steroids of Solu-Medrol 1 g daily for the next 3 days and would likely need further workup including a renal biopsy. It was recommended that [facility] evaluation where patient can be consulted on by the rheumatology service for further evaluation and management. Patient remained stable patient has been given a dose of Solu-Medrol 1 g 1 today 12/09/2014. Patient will be [facility] when a bed is available. She will be excepted to the general medicine service under Dr. Sherrin Daisyobrzynski.  #2 chest pain with generalized body ache/mild fever On presentation patient had some complaints of some pleuritic chest pain as well as generalized body aches and a fever. Chest x-ray which was done on admission showed small bilateral pleural effusions. Vascular congestion with increased interstitial markings. 2.6  cm nodular opacity of the left lung base. D-dimer was obtained which came back elevated at 4.15. Cardiac enzymes were cycled which were negative 2. EKG showed a sinus tachycardia.  Patient's tachycardia improved. CT angiogram of the chest was obtained which was negative for pulmonary embolus. Small pericardial effusion. Bilateral small pleural effusion with bilateral lower lobe posterior atelectasis. No complaints in pulmonary edema. No pulmonary nodules noted. Small amount of perisplenic or subscapular simple fluid. Borderline-enlarged bilateral axial lymph nodes. Small amount of free fluid noted along bilateral major fissures in the lungs. An influenza PCR panel was obtained which was negative. Urine pregnancy was negative. Urinalysis was negative for UTI however did have greater than 300 proteinuria. Group A streptococcus screen was done which was negative. Patient was treated with supportive care. It was felt patient's symptoms could likely be secondary to problem #1.  #3 Iron deficiency anemia/normocytic anemia Patient was noted to have a hemoglobin of 11. Anemia panel which was done showed a iron level of 6 with a ferritin of 101 TIBC of 175 and a Foley level of 11.5. B-12 levels was 262. Patient did not have any overt bleeding. Patient would likely benefit from oral iron supplementation. [facility]. See problem #1.  #4 recently diagnosed lupus Patient will follow-up with her rheumatologist. See problem #1. [facility] evaluation secondary to problem #1.  Procedures:  CT angiogram chest 12/09/2014  Chest x-ray 12/08/2014  Consultations:  None  Discharge Exam: Filed Vitals:   12/09/14 1018  BP: 121/76  Pulse: 108  Temp: 98.7 F (37.1 C)  Resp: 20    General: NAD Cardiovascular: Tachycardia Respiratory: CTAB  Discharge Instructions   Discharge Instructions    Diet general    Complete by:  As directed      Increase activity slowly    Complete by:  As directed           Current Discharge Medication List    START taking these medications   Details   methylPREDNISolone sodium succinate 1,000 mg in sodium chloride 0.9 % 50 mL Inject 1,000 mg into the vein daily. Qty: 2 g, Refills: 0      CONTINUE these medications which have NOT CHANGED   Details  acetaminophen (TYLENOL) 325 MG tablet Take 2 tablets (650 mg total) by mouth every 4 (four) hours as needed. Qty: 60 tablet, Refills: 0      STOP taking these medications     ibuprofen (ADVIL,MOTRIN) 600 MG tablet        No Known Allergies    The results of significant diagnostics from this hospitalization (including imaging, microbiology, ancillary and laboratory) are listed below for reference.    Significant Diagnostic Studies: Dg Chest 2 View  12/08/2014   CLINICAL DATA:  Acute onset of left-sided chest pain. Generalized body aches and bilateral flank pain. Initial encounter.  EXAM: CHEST  2 VIEW  COMPARISON:  None.  FINDINGS: The lungs are well-aerated. Small bilateral pleural effusions are noted. Vascular congestion is noted, with increased interstitial markings, concerning for mild interstitial edema. No pneumothorax is seen.  A 2.6 cm nodular opacity is noted at the left lung base. This may reflect the nipple shadow.  The heart is mildly enlarged. No acute osseous abnormalities are seen.  IMPRESSION: 1. Small bilateral pleural effusions noted. Vascular congestion, with increased interstitial markings, concerning for mild interstitial edema. 2. 2.6 cm nodular opacity at the left lung base.  This may reflect the nipple shadow; a mass is considered unlikely given the patient's age. Would suggest repeat chest radiograph with nipple markers.   Electronically Signed   By: Roanna Raider M.D.   On: 12/08/2014 20:28   Ct Angio Chest Pe W/cm &/or Wo Cm  12/09/2014   CLINICAL DATA:  Difficulty breathing, chest pain  EXAM: CT ANGIOGRAPHY CHEST WITH CONTRAST  TECHNIQUE: Multidetector CT imaging of the chest was performed using the standard protocol during bolus administration of intravenous  contrast. Multiplanar CT image reconstructions and MIPs were obtained to evaluate the vascular anatomy.  CONTRAST:  65mL OMNIPAQUE IOHEXOL 350 MG/ML SOLN  COMPARISON:  65 cc Omnipaque 350  FINDINGS: Sagittal images of the spine are unremarkable.  Central airways are patent. No mediastinal hematoma or adenopathy. No aortic aneurysm. The study is of excellent technical quality. No pulmonary embolus is noted.  No hilar adenopathy.  Small pericardial effusion is noted measures 4 mm maximum thickness. There is small bilateral pleural effusion. Bilateral lower lobe posterior atelectasis. Small amount of fluid noted bilateral major fissure. No convincing pulmonary edema. No destructive rib lesions. No pulmonary nodules. The visualized upper abdomen shows heterogeneous enhancement of the spleen. Question small perisplenic or subcapsular fluid.  A right axillary lymph node measures 1 by 1 cm. A left axillary lymph node measures 1.4 x 1 cm.  Review of the MIP images confirms the above findings.  IMPRESSION: 1. No pulmonary embolus is noted.  Small pericardial effusion. 2. There is bilateral small pleural effusion with bilateral lower lobe posterior atelectasis. 3. No convincing pulmonary edema. No pulmonary nodules are noted. Heterogeneous enhancement of the spleen. Question small amount of perisplenic or subcapsular simple fluid. 4. Borderline enlarged bilateral axillary lymph nodes. Small amount of free fluid noted along bilateral major fissure in the lungs   Electronically Signed   By: Natasha Mead M.D.   On: 12/09/2014 12:20    Microbiology: Recent Results (from the past 240 hour(s))  Rapid strep screen     Status: None   Collection Time: 12/08/14  9:05 PM  Result Value Ref Range Status   Streptococcus, Group A Screen (Direct) NEGATIVE NEGATIVE Final    Comment: (NOTE) A Rapid Antigen test may result negative if the antigen level in the sample is below the detection level of this test. The FDA has not cleared  this test as a stand-alone test therefore the rapid antigen negative result has reflexed to a Group A Strep culture.      Labs: Basic Metabolic Panel:  Recent Labs Lab 12/08/14 1918 12/09/14 0654  NA 138 133*  K 3.0* 3.4*  CL 108 106  CO2 23 22  GLUCOSE 84 76  BUN 10 9  CREATININE 0.49 0.53  CALCIUM 8.3* 7.7*   Liver Function Tests:  Recent Labs Lab 12/08/14 1918  AST 18  ALT 12*  ALKPHOS 63  BILITOT 0.7  PROT 7.4  ALBUMIN 2.8*    Recent Labs Lab 12/08/14 1918  LIPASE 26   No results for input(s): AMMONIA in the last 168 hours. CBC:  Recent Labs Lab 12/08/14 1918 12/09/14 0654  WBC 8.7 7.6  HGB 11.4* 11.0*  HCT 34.6* 33.1*  MCV 80.8 80.9  PLT 530* 533*   Cardiac Enzymes:  Recent Labs Lab 12/09/14 0654 12/09/14 1045  TROPONINI <0.03 <0.03   BNP: BNP (last 3 results)  Recent Labs  12/09/14 0654  BNP 105.7*    ProBNP (last 3 results) No results for input(s): PROBNP  in the last 8760 hours.  CBG: No results for input(s): GLUCAP in the last 168 hours.     SignedRamiro Harvest MD [facility] 12/09/2014, 3:00 PM

## 2014-12-09 NOTE — Progress Notes (Signed)
Interpreter phone used to communicate with patient. Assessment completed with help of interpreter. Patient understands care and plan for the day.

## 2014-12-10 LAB — URINE CULTURE
COLONY COUNT: NO GROWTH
Culture: NO GROWTH

## 2014-12-10 LAB — C3 COMPLEMENT: C3 Complement: 27 mg/dL — ABNORMAL LOW (ref 82–167)

## 2014-12-10 LAB — T3, FREE: T3, Free: 2.7 pg/mL (ref 2.0–4.4)

## 2014-12-10 LAB — C4 COMPLEMENT: Complement C4, Body Fluid: <5 mg/dL — ABNORMAL LOW (ref 14–44)

## 2014-12-11 LAB — CULTURE, GROUP A STREP: Strep A Culture: NEGATIVE

## 2014-12-15 LAB — CULTURE, BLOOD (ROUTINE X 2)
Culture: NO GROWTH
Culture: NO GROWTH

## 2014-12-16 ENCOUNTER — Telehealth: Payer: Self-pay | Admitting: *Deleted

## 2014-12-16 NOTE — Telephone Encounter (Signed)
LVM To return call 

## 2014-12-16 NOTE — Telephone Encounter (Signed)
-----   Message from Dessa PhiJosalyn Funches, MD sent at 12/05/2014  9:16 AM EDT ----- Stable CMP and CBC

## 2015-01-09 ENCOUNTER — Encounter: Payer: Self-pay | Admitting: Family Medicine

## 2015-02-02 ENCOUNTER — Emergency Department (HOSPITAL_COMMUNITY): Payer: Self-pay

## 2015-02-02 ENCOUNTER — Emergency Department (HOSPITAL_COMMUNITY)
Admission: EM | Admit: 2015-02-02 | Discharge: 2015-02-03 | Disposition: A | Discharge status: Other Acute Inpatient Hospital | Payer: Self-pay | Attending: Emergency Medicine | Admitting: Emergency Medicine

## 2015-02-02 ENCOUNTER — Encounter (HOSPITAL_COMMUNITY): Payer: Self-pay | Admitting: Emergency Medicine

## 2015-02-02 DIAGNOSIS — I959 Hypotension, unspecified: Secondary | ICD-10-CM | POA: Insufficient documentation

## 2015-02-02 DIAGNOSIS — R000 Tachycardia, unspecified: Secondary | ICD-10-CM | POA: Insufficient documentation

## 2015-02-02 DIAGNOSIS — Z79899 Other long term (current) drug therapy: Secondary | ICD-10-CM | POA: Insufficient documentation

## 2015-02-02 DIAGNOSIS — Z7952 Long term (current) use of systemic steroids: Secondary | ICD-10-CM | POA: Insufficient documentation

## 2015-02-02 DIAGNOSIS — I319 Disease of pericardium, unspecified: Secondary | ICD-10-CM | POA: Insufficient documentation

## 2015-02-02 DIAGNOSIS — M329 Systemic lupus erythematosus, unspecified: Secondary | ICD-10-CM

## 2015-02-02 DIAGNOSIS — I313 Pericardial effusion (noninflammatory): Secondary | ICD-10-CM | POA: Insufficient documentation

## 2015-02-02 DIAGNOSIS — I3139 Other pericardial effusion (noninflammatory): Secondary | ICD-10-CM

## 2015-02-02 DIAGNOSIS — M3212 Pericarditis in systemic lupus erythematosus: Secondary | ICD-10-CM | POA: Insufficient documentation

## 2015-02-02 MED ORDER — SODIUM CHLORIDE 0.9 % IV SOLN
1000.0000 mL | Freq: Once | INTRAVENOUS | Status: AC
  Administered 2015-02-02: 1000 mL via INTRAVENOUS

## 2015-02-02 MED ORDER — SODIUM CHLORIDE 0.9 % IV BOLUS (SEPSIS)
1000.0000 mL | Freq: Once | INTRAVENOUS | Status: AC
  Administered 2015-02-02: 1000 mL via INTRAVENOUS

## 2015-02-02 MED ORDER — SODIUM CHLORIDE 0.9 % IV SOLN
1000.0000 mL | INTRAVENOUS | Status: DC
  Administered 2015-02-02: 1000 mL via INTRAVENOUS

## 2015-02-02 MED ORDER — COLCHICINE 0.6 MG PO TABS
0.6000 mg | ORAL_TABLET | Freq: Once | ORAL | Status: AC
  Administered 2015-02-02: 0.6 mg via ORAL
  Filled 2015-02-02: qty 1

## 2015-02-02 MED ORDER — IBUPROFEN 800 MG PO TABS
800.0000 mg | ORAL_TABLET | Freq: Once | ORAL | Status: AC
Start: 2015-02-02 — End: 2015-02-02
  Administered 2015-02-02: 800 mg via ORAL
  Filled 2015-02-02: qty 1

## 2015-02-02 NOTE — ED Provider Notes (Signed)
EMERGENCY DEPARTMENT US CARDIAC EXAM "Study: Limited Ultrasound of the heart and pericardium"  INDICATIONS:Tachycardia and Dyspnea Multiple views of the heart and pericardium were obtained in real-time with a multi-frequency probe.  PERFORMED ZH:[id number]:Myself  IMAGES ARCHIVED?: Yes  FINDINGS: Moderate effusion, Normal contractility, IVC normal and Tamponade physiology absent  LIMITATIONS:  Emergent procedure  VIEWS USED: Subcostal 4 chamber, Parasternal long axis and Inferior Vena Cava  INTERPRETATION: Cardiac activity present, Pericardial effusion present, Cardiac tamponade absent and Normal contractility  CPT Code: 46962-9593308-26 (limited transthoracic cardiac)  Medical screening examination/treatment/procedure(s) were conducted as a shared visit with non-physician practitioner(s) and myself.  I personally evaluated the patient during the encounter.   EKG Interpretation   Date/Time:  Thursday February 02 2015 19:11:42 EDT Ventricular Rate:  123 PR Interval:  114 QRS Duration: 65 QT Interval:  287 QTC Calculation: 410 R Axis:   116 Text Interpretation:  Sinus tachycardia Ventricular premature complex  Aberrant complex Left posterior fascicular block Low voltage, extremity  leads ST elevation suggests acute pericarditis Confirmed by Mirian MoGentry,  Lamyiah Crawshaw [phone]([id number]) on 02/02/2015 7:17:48 PM        Briefly, pt is a 29 y.o. female presenting with chest pain, dyspnea.  She has a ho chronic chest pain, dyspnea, and lupus.  I performed an examination on the patient including cardiac, pulmonary, and gi systems which were unremarkable.  She has a moderate/large effusion without tamponade physiology on US.  Consulted cardiology who recommended medical admission.  [facility] due to failure of outpt lupus treatment and need for higher level of care and specialty services.     Mirian MoMatthew Lilyona Richner, MD 02/03/15 1640

## 2015-02-02 NOTE — Consult Note (Addendum)
CARDIOLOGY INPATIENT CONSULTATION NOTE  Patient ID: Wendy Kaufman MRN: [id number], DOB/AGE: [birth year]   Admit date: 02/02/2015   Primary Physician: No PCP Per Patient Primary Cardiologist: None  Reason for Consult:   Pericardial effusion and chest pain  Requesting Physician: Dr. Littie DeedsGentry  HPI: This is a 29 y.o. female with no history of CAD but has known history of SLE diagnosed 1 year ago who was recently admitted for lupus flare in 11/2014 and was transferred to [facility] in [place] for further care.  Patient was first noted to have pericardial effusion in trivial amount in 02/2014 when she was initially diagnosed with SLE. Patient was treated at that time with disease modifying drugs but was not able to afford cellcept. Later on she presented with lupus flare in 11/2014 at which time she was found to have small pericardial effusion on CT scan with 4mm maximal thickness on this non gated CT angiogram study. [facility] and received cellcept and prednisone at that time. However patient again presented with the same symptoms as she presented 1 month ago. This time, the bedside echo by the ED physician probably showed that she had worsening of pericardial effusion. However she remained hemodynamically stable. She is also complaining of fevers and chills for the last 9 days. She is unemployed, lives with brother and has 4 kids. She has severe pleuritic chest pain which gets worse with taking deep breaths. She has associated nausea, SOB and has worsening of pleural effusions as well.. She also has fevers and chills with poor appetite and has been losing weight. Cardiology was consulted for treatment of pericarditis.   EKG remains benign, with NSR low voltage, electrical alternans with diffuse non specific ST elevation and PR depression present   Problem List: Past Medical History  Diagnosis Date  . Medical history non-contributory   .  Lupus 03/09/14  . Lupus     Past Surgical History  Procedure Laterality Date  . Axillary lymph node biopsy Right 03/11/2014    Procedure: AXILLARY LYMPH NODE BIOPSY;  Surgeon: Wilmon ArmsMatthew K. Corliss Skainssuei, MD;  Location: [facility] OR;  Service: General;  Laterality: Right;     Allergies: No Known Allergies   Home Medications Current Facility-Administered Medications  Medication Dose Route Frequency Provider Last Rate Last Dose  . 0.9 %  sodium chloride infusion  1,000 mL Intravenous Continuous Cathren LaineKevin Steinl, MD 125 mL/hr at 02/02/15 2225 1,000 mL at 02/02/15 2225   Current Outpatient Prescriptions  Medication Sig Dispense Refill  . acetaminophen (TYLENOL) 325 MG tablet Take 2 tablets (650 mg total) by mouth every 4 (four) hours as needed. 60 tablet 0  . cetirizine (ZYRTEC) 10 MG tablet Take 1 tablet (10 mg total) by mouth daily. 30 tablet 2  . furosemide (LASIX) 20 MG tablet Take 20 mg by mouth.    . hydroxychloroquine (PLAQUENIL) 200 MG tablet Take 200 mg by mouth.    . methylPREDNISolone sodium succinate 1,000 mg in sodium chloride 0.9 % 50 mL Inject 1,000 mg into the vein daily. 2 g 0  . omeprazole (PRILOSEC) 40 MG capsule Take 1 capsule (40 mg total) by mouth 2 (two) times daily. 60 capsule 2  . predniSONE (DELTASONE) 10 MG tablet Take 1 tablet (10 mg total) by mouth daily with breakfast. 30 tablet 5     Family History  Problem Relation Age of Onset  . Lupus Neg Hx      History   Social History  .  Marital Status: Unknown    Spouse Name: N/A  . Number of Children: N/A  . Years of Education: N/A   Occupational History  . Not on file.   Social History Main Topics  . Smoking status: Never Smoker   . Smokeless tobacco: Not on file  . Alcohol Use: No  . Drug Use: No  . Sexual Activity: Yes   Other Topics Concern  . Not on file   Social History Narrative   ** Merged History Encounter **         Review of Systems: General: fatigue weight loss fevers and chills anorexia night  sweats  Cardiovascular: chest pain but no leg edema Dermatological: negative for rash Respiratory: non productive dry cough pleurisy negative for wheezing  Urologic: negative for hematuria Abdominal: negative for nausea, vomiting, diarrhea, bright red blood per rectum, melena, or hematemesis Neurologic: negative for visual changes, syncope, or dizziness Hematology: mild anemia Psychiatry: non suicidal/homicidal  Musculoskeletal: back pain   Physical Exam: Vitals: BP 101/80 mmHg  Pulse 96  Temp(Src) 98.5 F (36.9 C) (Oral)  Resp 20  Ht  (1.549 m)  Wt 48.535 kg (107 lb)  BMI 20.23 kg/m2  SpO2 100%  LMP 12/03/2014 Pulsus paradoxus not present, difference of 6 mmhg General: not in acute distress, appears sick  Neck: JVP flat, neck supple Heart: regular rate and rhythm, S1, S2, no murmurs no pericardial rub heard Lungs: decreased breath sounds at bases bilaterally, no crackles or wheezes GI: non tender, non distended, bowel sounds present Extremities: no edema Neuro: AAO x 3  Psych: normal affect, no anxiety   Labs:  No results found for this or any previous visit (from the past 24 hour(s)).   Radiology/Studies: Dg Chest 2 View  02/02/2015   CLINICAL DATA:  29 year old female with shortness of breath and fever  EXAM: CHEST  2 VIEW  COMPARISON:  Chest radiograph dated 05/17/2014  FINDINGS: Two views of the chest demonstrate an area of increased opacity at the left costophrenic angle likely related to small pleural effusion. Developing pneumonia is not excluded. There is no pneumothorax. Cardiomegaly, new compared to the prior study.  The osseous structures are grossly unremarkable.  IMPRESSION: Small left pleural effusion with possible developing pneumonia. Clinical correlation follow-up recommended.  Cardiomegaly, new from prior study.   Electronically Signed   By: Elgie Collard M.D.   On: 02/02/2015 20:33    EKG: NSR, diffuse ST elevation, PR depression present, electrical  alternans  Echo: per report, moderate amount of pericardial effusion w/o tamponade  Cardiac cath: NA  Medical decision making:  Discussed care with the patient Discussed care with the physician on the phone Reviewed labs and imaging personally Reviewed prior records Used interpretor line to talk and convey info the patient and the family  ASSESSMENT AND PLAN:  This is a 29 y.o.hispanic female with known lupus w/nephrotic syndrome, worsening symptoms presented with worsening pericardial effusion Hypotension Pericardial effusion, 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

## 2015-02-02 NOTE — ED Provider Notes (Signed)
CSN: [id number]     Arrival date & time 02/02/15  1903 History   First MD Initiated Contact with Patient 02/02/15 1913     Chief Complaint  Patient presents with  . Shortness of Breath  . Headache     (Consider location/radiation/quality/duration/timing/severity/associated sxs/prior Treatment) HPI Comments: 29 year old female past medical history of lupus presenting via EMS complaining of gradual onset shortness of breath beginning yesterday evening. Shortness of breath has been gradually worsening, present both at rest and on exertion. No aggravating or alleviating factors. Today, she was sitting in her apartment with no air conditioning and started to develop a generalized headache. States she feels very weak. No cough. Denies fevers. Denies chest pain, nausea, vomiting, numbness, tingling or confusion. 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 Sees rheumatology for lupus. It is noted she had an O2 sat of 92% on EMS on 4 L of oxygen, however on arrival to the ED, O2 sats at 99% on room air.  Patient is a 29 y.o. female presenting with shortness of breath and headaches. The history is provided by the patient. The history is limited by a language barrier. A language interpreter was used.  Shortness of Breath Associated symptoms: headaches   Headache Associated symptoms: weakness     Past Medical History  Diagnosis Date  . Medical history non-contributory   . Lupus 03/09/14  . Lupus    Past Surgical History  Procedure Laterality Date  . Axillary lymph node biopsy Right 03/11/2014    Procedure: AXILLARY LYMPH NODE BIOPSY;  Surgeon: Wilmon ArmsMatthew K. Corliss Skainssuei, MD;  Location: [facility] OR;  Service: General;  Laterality: Right;   Family History  Problem Relation Age of Onset  . Lupus Neg Hx    History  Substance Use Topics  . Smoking status: Never Smoker   . Smokeless tobacco: Not on file  .  Alcohol Use: No   OB History    Gravida Para Term Preterm AB TAB SAB Ectopic Multiple Living   3 3 3  0 0 0 0 0  1     Review of Systems  Respiratory: Positive for shortness of breath.   Neurological: Positive for weakness and headaches.  All other systems reviewed and are negative.     Allergies  Review of patient's allergies indicates no known allergies.  Home Medications   Prior to Admission medications   Medication Sig Start Date End Date Taking? Authorizing Provider  acetaminophen (TYLENOL) 325 MG tablet Take 2 tablets (650 mg total) by mouth every 4 (four) hours as needed. Patient not taking: Reported on 02/02/2015 11/05/14   Harle BattiestElizabeth Tysinger, NP  cetirizine (ZYRTEC) 10 MG tablet Take 1 tablet (10 mg total) by mouth daily. Patient not taking: Reported on 02/02/2015 10/21/14   Dessa PhiJosalyn Funches, MD  methylPREDNISolone sodium succinate 1,000 mg in sodium chloride 0.9 % 50 mL Inject 1,000 mg into the vein daily. Patient not taking: Reported on 02/02/2015 12/09/14   Rodolph Bonganiel Thompson V, MD  omeprazole (PRILOSEC) 40 MG capsule Take 1 capsule (40 mg total) by mouth 2 (two) times daily. Patient not taking: Reported on 02/02/2015 12/01/14   Dessa PhiJosalyn Funches, MD  predniSONE (DELTASONE) 10 MG tablet Take 1 tablet (10 mg total) by mouth daily with breakfast. Patient not taking: Reported on 02/02/2015 12/01/14   Josalyn Funches, MD   BP 101/80 mmHg  Pulse 96  Temp(Src) 98.5 F (36.9  C) (Oral)  Resp 20  Ht  (1.549 m)  Wt 107 lb (48.535 kg)  BMI 20.23 kg/m2  SpO2 100%  LMP 12/03/2014 Physical Exam  Constitutional: She is oriented to person, place, and time. She appears well-developed and well-nourished.  Mild distress. Uncomfortable.  HENT:  Head: Normocephalic and atraumatic.  Mouth/Throat: Oropharynx is clear and moist.  Eyes: Conjunctivae and EOM are normal.  Neck: Normal range of motion. Neck supple.  Cardiovascular: Regular rhythm and normal heart sounds.  Tachycardia present.    Pulmonary/Chest: Breath sounds normal. Tachypnea noted. She is in respiratory distress (mild).  Musculoskeletal: Normal range of motion. She exhibits no edema.  Neurological: She is alert and oriented to person, place, and time. No sensory deficit.  Skin: Skin is warm and dry.  Psychiatric: She has a normal mood and affect. Her behavior is normal.  Nursing note and vitals reviewed.   ED Course  Procedures (including critical care time) Labs Review Labs Reviewed  CULTURE, BLOOD (ROUTINE X 2)  CULTURE, BLOOD (ROUTINE X 2)  CULTURE, BLOOD (ROUTINE X 2)  CULTURE, BLOOD (ROUTINE X 2)  CBC  COMPREHENSIVE METABOLIC PANEL  BRAIN NATRIURETIC PEPTIDE  CBC  COMPREHENSIVE METABOLIC PANEL  BRAIN NATRIURETIC PEPTIDE  I-STAT TROPOININ, ED  I-STAT CG4 LACTIC ACID, ED  I-STAT CG4 LACTIC ACID, ED    Imaging Review Dg Chest 2 View  02/02/2015   CLINICAL DATA:  29 year old female with shortness of breath and fever  EXAM: CHEST  2 VIEW  COMPARISON:  Chest radiograph dated 05/17/2014  FINDINGS: Two views of the chest demonstrate an area of increased opacity at the left costophrenic angle likely related to small pleural effusion. Developing pneumonia is not excluded. There is no pneumothorax. Cardiomegaly, new compared to the prior study.  The osseous structures are grossly unremarkable.  IMPRESSION: Small left pleural effusion with possible developing pneumonia. Clinical correlation follow-up recommended.  Cardiomegaly, new from prior study.   Electronically Signed   By: Elgie Collard M.D.   On: 02/02/2015 20:33     EKG Interpretation   Date/Time:  Thursday February 02 2015 19:11:42 EDT Ventricular Rate:  123 PR Interval:  114 QRS Duration: 65 QT Interval:  287 QTC Calculation: 410 R Axis:   116 Text Interpretation:  Sinus tachycardia Ventricular premature complex  Aberrant complex Left posterior fascicular block Low voltage, extremity  leads ST elevation suggests acute pericarditis Confirmed  by Mirian Mo [phone]) on 02/02/2015 7:17:48 PM      MDM   Final diagnoses:  Lupus pericarditis  Pericardial effusion  Lupus   Nontoxic appearing. Mildly distressed. Temperature 100.3 on arrival, noted to have a low-grade fever at last admission. Tachycardic. O2 sat 99% on room air. EKG concerning for. Carditis. Bedside ultrasound performed by Dr. Littie Deeds showing moderate pericardial effusion. No evidence of cardiac tamponade on. Cardiology consult appreciated. Seen by Dr. Virgina Organ, who feels this is most likely from an acute lupus flare. Ibuprofen was already given, added on colchicine. On chart review, the patient was transferred to [facility] for evaluation by rheumatology at last admission. I spoke with Dr. Allena Katz, on call for rheumatology at [facility], who states he is happy to see the patient in consult if she is transferred for admission. Patient will be admitted to [facility]. I spoke with hospitalist Dr. Concha Norway who accepts the pt in admission. Vital signs have improved after IV fluids and ibuprofen. Stable for transfer.  Discussed with attending Dr. Littie Deeds who also evaluated patient and agrees with  plan of care.  Kathrynn Speed, PA-C 02/02/15 1610  Mirian Mo, MD 02/03/15 [phone]

## 2015-02-02 NOTE — ED Notes (Signed)
Pt brought by [facility] from home for SOB and HA, pt has hx of Lupus, was found by EMS on her Apartment very hot with no AC available, pt looks very weak a this time. VS by [facility] BP 90/50, Temp 99, HR 124. O2 92% on 4L [place], R 16.

## 2015-02-03 DIAGNOSIS — I319 Disease of pericardium, unspecified: Secondary | ICD-10-CM

## 2015-02-03 DIAGNOSIS — M3212 Pericarditis in systemic lupus erythematosus: Secondary | ICD-10-CM | POA: Insufficient documentation

## 2015-02-03 DIAGNOSIS — I959 Hypotension, unspecified: Secondary | ICD-10-CM

## 2015-02-03 LAB — I-STAT TROPONIN, ED: Troponin i, poc: 0 ng/mL (ref 0.00–0.08)

## 2015-02-06 LAB — I-STAT CG4 LACTIC ACID, ED: LACTIC ACID, VENOUS: 1.35 mmol/L (ref 0.5–2.0)

## 2015-02-07 LAB — CULTURE, BLOOD (ROUTINE X 2)
CULTURE: NO GROWTH
Culture: NO GROWTH

## 2015-02-13 ENCOUNTER — Telehealth: Payer: Self-pay | Admitting: Family Medicine

## 2015-02-13 NOTE — Telephone Encounter (Signed)
Patient called requesting medication refill on predniSONE (DELTASONE) 10 MG tablet, patient states she is completely out of medication. Please f/u

## 2015-02-15 NOTE — Telephone Encounter (Signed)
Pt advised to contact pharmacy for refills

## 2015-02-21 ENCOUNTER — Ambulatory Visit: Payer: Self-pay | Attending: Family Medicine | Admitting: Family Medicine

## 2015-02-21 ENCOUNTER — Encounter: Payer: Self-pay | Admitting: Family Medicine

## 2015-02-21 VITALS — BP 98/62 | HR 120 | Temp 98.7°F | Resp 16 | Ht 61.0 in | Wt 101.0 lb

## 2015-02-21 DIAGNOSIS — I319 Disease of pericardium, unspecified: Secondary | ICD-10-CM | POA: Insufficient documentation

## 2015-02-21 DIAGNOSIS — D75838 Other thrombocytosis: Secondary | ICD-10-CM

## 2015-02-21 DIAGNOSIS — Z748 Other problems related to care provider dependency: Secondary | ICD-10-CM

## 2015-02-21 DIAGNOSIS — G737 Myopathy in diseases classified elsewhere: Secondary | ICD-10-CM | POA: Insufficient documentation

## 2015-02-21 DIAGNOSIS — R7989 Other specified abnormal findings of blood chemistry: Secondary | ICD-10-CM

## 2015-02-21 DIAGNOSIS — M329 Systemic lupus erythematosus, unspecified: Secondary | ICD-10-CM | POA: Insufficient documentation

## 2015-02-21 DIAGNOSIS — M3219 Other organ or system involvement in systemic lupus erythematosus: Secondary | ICD-10-CM

## 2015-02-21 LAB — BASIC METABOLIC PANEL
BUN: 14 mg/dL (ref 7–25)
CO2: 22 mmol/L (ref 20–31)
CREATININE: 0.53 mg/dL (ref 0.50–1.10)
Calcium: 8.5 mg/dL — ABNORMAL LOW (ref 8.6–10.2)
Chloride: 109 mmol/L (ref 98–110)
GLUCOSE: 96 mg/dL (ref 65–99)
Potassium: 4.5 mmol/L (ref 3.5–5.3)
Sodium: 141 mmol/L (ref 135–146)

## 2015-02-21 LAB — POCT URINALYSIS DIPSTICK
Bilirubin, UA: NEGATIVE
Glucose, UA: NEGATIVE
Ketones, UA: NEGATIVE
Nitrite, UA: NEGATIVE
PH UA: 5.5
Spec Grav, UA: 1.015
Urobilinogen, UA: 0.2

## 2015-02-21 NOTE — Progress Notes (Signed)
   Subjective:    Patient ID: Wendy Kaufman, female    DOB: 04-Nov-1985, 29 y.o.   MRN: [id number] CC: HFU for lupus with pericarditis  HPI 29 yo F with lupus presents for f/u appt.   1. Pericarditis: she was recently hospitalized for cardiomegaly and CP, went to [facility] ED then transferred to [facility] for Hospital[facility], see care everywhere. Restarted on prednisone and cellcept. Not taking plaquenil. Has some CP that is moderate. No fever, chills, SOB, dizziness or lightheadedness. Has f/u with IM this months and Rheumatology in November. Does not have transportation.   History  Substance Use Topics  . Smoking status: Never Smoker   . Smokeless tobacco: Not on file  . Alcohol Use: No    Review of Systems  Constitutional: Negative for fever and chills.  Respiratory: Negative for shortness of breath.   Cardiovascular: Positive for chest pain.  Gastrointestinal: Negative for abdominal pain and blood in stool.  Skin: Negative for rash.  Neurological: Negative for dizziness and light-headedness.  Psychiatric/Behavioral: Negative for suicidal ideas and dysphoric mood.      Objective:   Physical Exam BP 98/62 mmHg  Pulse 120  Temp(Src) 98.7 F (37.1 C) (Oral)  Resp 16  Ht  (1.549 m)  Wt 101 lb (45.813 kg)  BMI 19.09 kg/m2  SpO2 98%  LMP 12/03/2014 General appearance: alert, cooperative and no distress Throat: lips, mucosa, and tongue normal; teeth and gums normal Lungs: clear to auscultation bilaterally Heart: S1, S2 normal, elevated HR  Extremities: extremities normal, atraumatic, no cyanosis or edema Skin: Skin color, texture, turgor normal. No rashes or lesions       Assessment & Plan:

## 2015-02-21 NOTE — Assessment & Plan Note (Addendum)
A: Elevated pulse with enlarged heart, concerning for myocarditis. Patient compliant with prednisone and cellcept.   P: ECHO ordered Pro-BNP CBC BMP  Elevated proBNP consistent with cardiomyopathy, cardiology referral placed

## 2015-02-21 NOTE — Assessment & Plan Note (Signed)
Internal referral to [facility] for transportation

## 2015-02-21 NOTE — Patient Instructions (Addendum)
Mrs. Samson Frederic,  Thank you for coming in today  1. Elevated pulse with enlarged heart, concerning for myocarditis  ECHO ordered Pro-BNP CBC BMP  2. Transportation needs: Working to arrange transportation for the following appointments :  Date Type Specialty Providers Description  03/07/2015 Appointment Internal Medicine Selena Batten, [place] Highland, MD  [address]  [place], [place] [address]  [phone]  [phone] (Fax)    06/09/2015 Appointment Rheumatology Cory Munch, MD  [address]  [place], [place] [address]  [phone]  [phone] (Fax)     F/u with me in 4 weeks   Dr. Armen Pickup

## 2015-02-21 NOTE — Progress Notes (Signed)
F/U Lupus

## 2015-02-22 ENCOUNTER — Ambulatory Visit (HOSPITAL_COMMUNITY)
Admission: RE | Admit: 2015-02-22 | Discharge: 2015-02-22 | Disposition: A | Discharge status: Home / Self Care | Payer: MEDICAID | Source: Ambulatory Visit | Attending: Family Medicine | Admitting: Family Medicine

## 2015-02-22 DIAGNOSIS — G737 Myopathy in diseases classified elsewhere: Secondary | ICD-10-CM

## 2015-02-22 DIAGNOSIS — D75838 Other thrombocytosis: Secondary | ICD-10-CM | POA: Insufficient documentation

## 2015-02-22 DIAGNOSIS — R7989 Other specified abnormal findings of blood chemistry: Secondary | ICD-10-CM | POA: Insufficient documentation

## 2015-02-22 DIAGNOSIS — M3219 Other organ or system involvement in systemic lupus erythematosus: Secondary | ICD-10-CM

## 2015-02-22 DIAGNOSIS — I071 Rheumatic tricuspid insufficiency: Secondary | ICD-10-CM | POA: Insufficient documentation

## 2015-02-22 DIAGNOSIS — I517 Cardiomegaly: Secondary | ICD-10-CM | POA: Insufficient documentation

## 2015-02-22 LAB — CBC
HCT: 33.9 % — ABNORMAL LOW (ref 36.0–46.0)
HEMOGLOBIN: 11 g/dL — AB (ref 12.0–15.0)
MCH: 28.2 pg (ref 26.0–34.0)
MCHC: 32.4 g/dL (ref 30.0–36.0)
MCV: 86.9 fL (ref 78.0–100.0)
MPV: 7.9 fL — ABNORMAL LOW (ref 8.6–12.4)
PLATELETS: 906 10*3/uL — AB (ref 150–400)
RBC: 3.9 MIL/uL (ref 3.87–5.11)
RDW: 18.5 % — AB (ref 11.5–15.5)
WBC: 12.5 10*3/uL — AB (ref 4.0–10.5)

## 2015-02-22 LAB — PATHOLOGIST SMEAR REVIEW

## 2015-02-22 LAB — PRO B NATRIURETIC PEPTIDE: PRO B NATRI PEPTIDE: 1339 pg/mL — AB (ref <126)

## 2015-02-22 MED ORDER — ASPIRIN EC 81 MG PO TBEC: 81.0000 mg | DELAYED_RELEASE_TABLET | Freq: Every day | ORAL | Status: DC

## 2015-02-22 MED ORDER — HYDROXYCHLOROQUINE SULFATE 200 MG PO TABS: 200.0000 mg | ORAL_TABLET | Freq: Every day | ORAL | Status: DC

## 2015-02-22 NOTE — Progress Notes (Signed)
  Echocardiogram 2D Echocardiogram has been performed.  Arvil Chaco 02/22/2015, 12:06 PM

## 2015-02-22 NOTE — Assessment & Plan Note (Signed)
Persistent high platelets in setting of lupus, compared to [facility] visit, be sure to take aspirin  daily

## 2015-02-22 NOTE — Addendum Note (Signed)
Addended by: Dessa Phi on: 02/22/2015 01:23 PM   Modules accepted: Orders

## 2015-02-22 NOTE — Assessment & Plan Note (Signed)
A: lupus with cardiomyopathy, pericarditis and reactive thrombocytosis P: Plaquenil 200 mg daily sent to onsite pharmacy as patient is also suppose to be on cellcept for

## 2015-03-24 ENCOUNTER — Ambulatory Visit (INDEPENDENT_AMBULATORY_CARE_PROVIDER_SITE_OTHER): Payer: Self-pay | Admitting: Cardiovascular Disease

## 2015-03-24 ENCOUNTER — Encounter: Payer: Self-pay | Admitting: Cardiovascular Disease

## 2015-03-24 VITALS — BP 118/64 | HR 92 | Ht 60.0 in | Wt 108.8 lb

## 2015-03-24 DIAGNOSIS — I319 Disease of pericardium, unspecified: Secondary | ICD-10-CM

## 2015-03-24 DIAGNOSIS — I514 Myocarditis, unspecified: Secondary | ICD-10-CM | POA: Insufficient documentation

## 2015-03-24 HISTORY — DX: Myocarditis, unspecified: I51.4

## 2015-03-24 MED ORDER — INDOMETHACIN 50 MG PO CAPS: 50.0000 mg | ORAL_CAPSULE | ORAL | Status: DC

## 2015-03-24 NOTE — Progress Notes (Signed)
Cardiology Office Note   Date:  03/24/2015   ID:  Wendy Kaufman, DOB 23-Apr-1986, MRN [id number]  PCP:  No PCP Per Patient  Cardiologist:   Madilyn Hook, MD   Chief Complaint  Patient presents with  . New Evaluation    patient reports chest pain when laying down and with shortness of breath at rest.      History of Present Illness: Wendy Kaufman is a 29 y.o. 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   Wendy Kaufman was diagnosed with SLE in 2015.  She was noted to have a trivial pericardial effusion on 02/2014.  She was treated with DMARDS but could not afford cellcept at the time.  She had a lupus flare 11/2014 and had a small pericardial effusion on CT scan (4mm).  She was referred to [facility] and began treatment with cellcept and prednisone.   The following month she reportedly had a recurrent flare of SLE and bedside echo showed worsening pericardial effusion but no tamponade.  She presened to the [facility] ED on 02/02/15 with fever, chills, and pleuritic chest pain.  ECG showed electrical alternans and diffuse, non-specific ST elevation with PR depression.   Wendy Kaufman was evaluated by Dr. Virgina Organ and started on colchicine for five days.  This helped her pain initially, but it recurred.  She continues to have constant CP that is worse with laying down or when taking a deep breath.  She endorses dyspnea with minimal exertion and palpitations that are associated with SOB and dizziness.  The episodes occur once or twice per week and last for 5 seconds.  [name] followed up with her primary care provider, Dr. Armen Pickup, who discovered that her BNP was elevated to 1340.  She obtained an echo that showed LVEF 55-60% and grade 1 diastolic dysfunction.  There was no pericardial effusion at that time.  Given these findings she was referred to cardiology for evaluation for myocarditis.   Past Medical History  Diagnosis  Date  . Medical history non-contributory   . Lupus 03/09/14  . Lupus     Past Surgical History  Procedure Laterality Date  . Axillary lymph node biopsy Right 03/11/2014    Procedure: AXILLARY LYMPH NODE BIOPSY;  Surgeon: Wilmon Arms. Corliss Skains, MD;  Location: [facility] OR;  Service: General;  Laterality: Right;     Current Outpatient Prescriptions  Medication Sig Dispense Refill  . mycophenolate (CELLCEPT) 500 MG tablet Take 500 mg by mouth 2 (two) times daily.    Marland Kitchen omeprazole (PRILOSEC) 40 MG capsule Take 1 capsule (40 mg total) by mouth 2 (two) times daily. 60 capsule 2  . predniSONE (DELTASONE) 10 MG tablet Take 1 tablet (10 mg total) by mouth daily with breakfast. (Patient taking differently: Take 50 mg by mouth daily with breakfast. ) 30 tablet 5  . indomethacin (INDOCIN) 50 MG capsule Take 1 capsule (50 mg total) by mouth as directed. 42 capsule 0   No current facility-administered medications for this visit.    Allergies:   Review of patient's allergies indicates no known allergies.    Social History:  The patient  reports that she has never smoked. She does not have any smokeless tobacco history on file. She reports that she does not drink alcohol or use illicit drugs.   Family History:  The patient's family history is negative for Lupus.    ROS:  Please see the history of present illness.  Otherwise, review of systems are positive for none.   All other systems are reviewed and negative.    PHYSICAL EXAM: VS:  BP 118/64 mmHg  Pulse 92  Ht 5' (1.524 m)  Wt 49.351 kg (108 lb 12.8 oz)  BMI 21.25 kg/m2 , BMI Body mass index is 21.25 kg/(m^2). GENERAL:  Well appearing HEENT:  Pupils equal round and reactive, fundi not visualized, oral mucosa unremarkable NECK: JVP 1 cm above clavicle at 45 degrees, waveform within normal limits, carotid upstroke brisk and symmetric, no bruits, no thyromegaly LYMPHATICS:  No cervical adenopathy LUNGS:  Clear to auscultation bilaterally HEART:  RRR.   PMI not displaced or sustained,S1 and S2 within normal limits, no S3, no S4, no clicks, no rubs, no murmurs ABD:  Flat, positive bowel sounds normal in frequency in pitch, no bruits, no rebound, no guarding, no midline pulsatile mass, no hepatomegaly, no splenomegaly EXT:  2+ radial pulses bilaterally.  1+ DP/PT bilaterally, no edema, no cyanosis no clubbing SKIN:  No rashes no nodules NEURO:  Cranial nerves II through XII grossly intact, motor grossly intact throughout PSYCH:  Cognitively intact, oriented to person place and time    EKG:  EKG is ordered today. The ekg ordered today demonstrates sinus rhythm at 92 bpm.  Diffuse t wave inversions concerning for ischemia.  TTE 02/22/15: LVEF 55-60%.  Grade 1 diastolic dysfunction.  RV mildly dilated and mildly hypokinetic.  Mild TR.  No pericardial effusion.  Recent Labs: 12/08/2014: ALT 12* 12/09/2014: B Natriuretic Peptide 105.7*; TSH 1.306 02/21/2015: BUN 14; Creat 0.53; Hemoglobin 11.0*; Platelets 906*; Potassium 4.5; Pro B Natriuretic peptide (BNP) 1339.00*; Sodium 141    Lipid Panel    Component Value Date/Time   CHOL 92 03/10/2014 1330      Wt Readings from Last 3 Encounters:  03/24/15 49.351 kg (108 lb 12.8 oz)  02/21/15 45.813 kg (101 lb)  02/02/15 48.535 kg (107 lb)      Other studies Reviewed: Additional studies/ records that were reviewed today include: . Review of the above records demonstrates:  Please see elsewhere in the note.     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  It seems that it recurred immediately after stopping colchicine.  She has never been treated with NSAIDS. Additionally, her exertional dyspnea, elevated BNP and T wave inversions are concerning for myocarditis.  Given her lupus, this is the most likely source.  I recommend that she follow up with her Rheumatologist, Dr.  Nunzio Cobbs in [place] for consideration of high-dose steroids or the treatment he deems best for Lupus pericarditis. - Indomethacin  tid x2 weeks then bid x1 week then daily x1 week - Follow up with Rheumatology in 1-2 weeks   Current medicines are reviewed at length with the patient today.  The patient does not have concerns regarding medicines.  The following changes have been made:  Start Indomethacin  Labs/ tests ordered today include:   Orders Placed This Encounter  Procedures  . EKG 12-Lead     Disposition:   FU with Dr. Elmarie Shiley C. [place] in 3 months.    Signed, Madilyn Hook, MD  03/24/2015 2:26 PM    [facility]

## 2015-03-24 NOTE — Patient Instructions (Signed)
Dr Duke Salvia has given you a prescription for the following medication(s): Indomethacin 50 mg - take 1 tablet by mouth 3 times daily FOR 2 WEEKS, then take 1 tablet 2 times daily FOR 1 WEEK, and then take 1 tablet daily FOR 1 WEEK.  **Please call our office to give the nurse the name of your rheumatologist. ALSO PLEASE SCHEDULE AN APPOINTMENT WITH YOUR RHEUMATOLOGIST WITH IN A WEEK.  Dr Duke Salvia recommends that you schedule a follow-up appointment in 3 months.

## 2015-06-21 ENCOUNTER — Telehealth: Payer: Self-pay | Admitting: Family Medicine

## 2015-06-21 ENCOUNTER — Telehealth: Payer: Self-pay | Admitting: General Practice

## 2015-06-21 NOTE — Telephone Encounter (Signed)
predniSONE (DELTASONE) 10 MG tablet omeprazole (PRILOSEC) 40 MG capsule

## 2015-06-26 NOTE — Telephone Encounter (Signed)
Pt. Called requesting a med refill on predniSONE (DELTASONE) 10 MG tablet  omeprazole (PRILOSEC) 40 MG capsule. Please f/u with pt.

## 2015-06-28 ENCOUNTER — Other Ambulatory Visit: Payer: Self-pay | Admitting: *Deleted

## 2015-06-28 DIAGNOSIS — M329 Systemic lupus erythematosus, unspecified: Principal | ICD-10-CM

## 2015-06-28 DIAGNOSIS — I7789 Other specified disorders of arteries and arterioles: Secondary | ICD-10-CM

## 2015-06-28 DIAGNOSIS — J029 Acute pharyngitis, unspecified: Secondary | ICD-10-CM

## 2015-06-28 MED ORDER — OMEPRAZOLE 40 MG PO CPDR: 40.0000 mg | DELAYED_RELEASE_CAPSULE | Freq: Two times a day (BID) | ORAL | Status: DC

## 2015-06-28 MED ORDER — PREDNISONE 10 MG PO TABS: 10.0000 mg | ORAL_TABLET | Freq: Every day | ORAL | Status: DC

## 2015-06-28 NOTE — Telephone Encounter (Signed)
[facility]

## 2015-07-07 ENCOUNTER — Telehealth: Payer: Self-pay | Admitting: Family Medicine

## 2015-07-07 ENCOUNTER — Ambulatory Visit: Payer: Self-pay | Attending: Family Medicine | Admitting: Family Medicine

## 2015-07-07 ENCOUNTER — Encounter: Payer: Self-pay | Admitting: Family Medicine

## 2015-07-07 ENCOUNTER — Ambulatory Visit: Payer: Medicaid Other | Admitting: Cardiovascular Disease

## 2015-07-07 VITALS — BP 90/62 | HR 97 | Temp 98.4°F | Resp 16 | Ht 61.0 in | Wt 121.0 lb

## 2015-07-07 DIAGNOSIS — D848 Other specified immunodeficiencies: Secondary | ICD-10-CM | POA: Insufficient documentation

## 2015-07-07 DIAGNOSIS — D84821 Immunodeficiency due to drugs: Secondary | ICD-10-CM | POA: Insufficient documentation

## 2015-07-07 DIAGNOSIS — T380X5A Adverse effect of glucocorticoids and synthetic analogues, initial encounter: Secondary | ICD-10-CM | POA: Insufficient documentation

## 2015-07-07 DIAGNOSIS — Z748 Other problems related to care provider dependency: Secondary | ICD-10-CM | POA: Insufficient documentation

## 2015-07-07 DIAGNOSIS — M329 Systemic lupus erythematosus, unspecified: Secondary | ICD-10-CM | POA: Insufficient documentation

## 2015-07-07 DIAGNOSIS — Z7952 Long term (current) use of systemic steroids: Secondary | ICD-10-CM

## 2015-07-07 DIAGNOSIS — X58XXXA Exposure to other specified factors, initial encounter: Secondary | ICD-10-CM | POA: Insufficient documentation

## 2015-07-07 DIAGNOSIS — M3219 Other organ or system involvement in systemic lupus erythematosus: Secondary | ICD-10-CM

## 2015-07-07 DIAGNOSIS — T380X1A Poisoning by glucocorticoids and synthetic analogues, accidental (unintentional), initial encounter: Secondary | ICD-10-CM | POA: Insufficient documentation

## 2015-07-07 DIAGNOSIS — Z0000 Encounter for general adult medical examination without abnormal findings: Secondary | ICD-10-CM | POA: Insufficient documentation

## 2015-07-07 DIAGNOSIS — R809 Proteinuria, unspecified: Secondary | ICD-10-CM | POA: Insufficient documentation

## 2015-07-07 DIAGNOSIS — IMO0001 Reserved for inherently not codable concepts without codable children: Secondary | ICD-10-CM | POA: Insufficient documentation

## 2015-07-07 DIAGNOSIS — R4789 Other speech disturbances: Secondary | ICD-10-CM | POA: Insufficient documentation

## 2015-07-07 DIAGNOSIS — Z3042 Encounter for surveillance of injectable contraceptive: Secondary | ICD-10-CM | POA: Insufficient documentation

## 2015-07-07 DIAGNOSIS — Z79899 Other long term (current) drug therapy: Secondary | ICD-10-CM | POA: Insufficient documentation

## 2015-07-07 LAB — COMPLETE METABOLIC PANEL WITH GFR
ALT: 23 U/L (ref 6–29)
AST: 21 U/L (ref 10–30)
Albumin: 3.5 g/dL — ABNORMAL LOW (ref 3.6–5.1)
Alkaline Phosphatase: 118 U/L — ABNORMAL HIGH (ref 33–115)
BILIRUBIN TOTAL: 0.3 mg/dL (ref 0.2–1.2)
BUN: 9 mg/dL (ref 7–25)
CALCIUM: 9.4 mg/dL (ref 8.6–10.2)
CHLORIDE: 105 mmol/L (ref 98–110)
CO2: 22 mmol/L (ref 20–31)
Creat: 0.61 mg/dL (ref 0.50–1.10)
Glucose, Bld: 70 mg/dL (ref 65–99)
Potassium: 4.2 mmol/L (ref 3.5–5.3)
Sodium: 140 mmol/L (ref 135–146)
TOTAL PROTEIN: 6.8 g/dL (ref 6.1–8.1)

## 2015-07-07 LAB — POCT URINE PREGNANCY: Preg Test, Ur: NEGATIVE

## 2015-07-07 LAB — POCT URINALYSIS DIPSTICK
BILIRUBIN UA: NEGATIVE
Glucose, UA: NEGATIVE
KETONES UA: NEGATIVE
Leukocytes, UA: NEGATIVE
Nitrite, UA: NEGATIVE
PH UA: 5.5
Protein, UA: >=300
Spec Grav, UA: >=1.03
Urobilinogen, UA: 0.2

## 2015-07-07 MED ORDER — CALCIUM CARBONATE 1250 (500 CA) MG PO TABS: 1250.0000 mg | ORAL_TABLET | Freq: Every day | ORAL | Status: AC

## 2015-07-07 MED ORDER — OMEPRAZOLE 40 MG PO CPDR: 40.0000 mg | DELAYED_RELEASE_CAPSULE | Freq: Every day | ORAL | Status: DC

## 2015-07-07 MED ORDER — LISINOPRIL 5 MG PO TABS: 5.0000 mg | ORAL_TABLET | Freq: Every day | ORAL | Status: DC

## 2015-07-07 MED ORDER — CHOLECALCIFEROL 25 MCG (1000 UT) PO TABS: 2000.0000 [IU] | ORAL_TABLET | Freq: Every day | ORAL | Status: AC

## 2015-07-07 MED ORDER — HYDROXYCHLOROQUINE SULFATE 200 MG PO TABS: 200.0000 mg | ORAL_TABLET | Freq: Every day | ORAL | Status: DC

## 2015-07-07 MED ORDER — CALCIUM CARBONATE 1250 (500 CA) MG PO TABS
1250.0000 mg | ORAL_TABLET | Freq: Every day | ORAL | Status: DC
Start: 2015-07-07 — End: 2015-07-07

## 2015-07-07 MED ORDER — SULFAMETHOXAZOLE-TRIMETHOPRIM 800-160 MG PO TABS: 1.0000 | ORAL_TABLET | ORAL | Status: DC

## 2015-07-07 MED ORDER — PREDNISONE 10 MG PO TABS: 10.0000 mg | ORAL_TABLET | Freq: Every day | ORAL | Status: DC

## 2015-07-07 MED ORDER — MEDROXYPROGESTERONE ACETATE 150 MG/ML IM SUSP: 150.0000 mg | Freq: Once | INTRAMUSCULAR | Status: DC

## 2015-07-07 MED ORDER — MYCOPHENOLATE MOFETIL 500 MG PO TABS: 1000.0000 mg | ORAL_TABLET | Freq: Two times a day (BID) | ORAL | Status: DC

## 2015-07-07 MED ORDER — CHOLECALCIFEROL 25 MCG (1000 UT) PO TABS: 2000.0000 [IU] | ORAL_TABLET | Freq: Every day | ORAL | Status: DC

## 2015-07-07 MED ORDER — SULFAMETHOXAZOLE-TRIMETHOPRIM 800-160 MG PO TABS
1.0000 | ORAL_TABLET | ORAL | Status: DC
Start: 2015-07-07 — End: 2015-07-07

## 2015-07-07 MED ORDER — MEDROXYPROGESTERONE ACETATE 150 MG/ML IM SUSP: 150.0000 mg | INTRAMUSCULAR | Status: AC

## 2015-07-07 NOTE — Assessment & Plan Note (Signed)
A: Patient with lupus. She is partially compliant. She is motivated to get and stay well. Her major limitation is transportation to f/u appts.   P: coordinate care with nephrology and rheumatology.  [facility], NP who saw patient last.  Patient will be started on lisinopril today for proteinuria. She will f.u with nephrology on 07/18/15 for labs and evaluation.  I have continued prednisone 10 mg daily and have called patient's rheumatologist to determine if a higher dose if preferred and if a closer f/u appt can be scheduled, preferably on the same date as her nephrology appt.

## 2015-07-07 NOTE — Assessment & Plan Note (Signed)
On depo. Next injection due mid Feb 2017. Patient advised to come here for depo so we can track compliance.

## 2015-07-07 NOTE — Telephone Encounter (Signed)
Called patient nephrologist she has seen Cordelia PenSherry Close, NP We discussed starting lisinopril 5 mg daily, patient has yet to start lisinopril and bactrim. We also discussed her prednisone dose which was 60 at d/c from hospital in 03/2015.  We agreed to start lisinopril 5 mg as patient's U preg is negative.  Bactrim ordered Kept prednisone at 10 mg until patient can see her nephrologist.      [facility]. As of now the patient's current rheumatology appt is the earliest available. Patient has been placed on waiting list for sooner appt. Also I left a VM requesting a call back to discuss patient's prednisone dose.

## 2015-07-07 NOTE — Assessment & Plan Note (Signed)
Patient misses important f/u appt with [facility] rheumatologist and [facility] due to a lack of transportation. Patient is aware that we will coordinate [facility] as long as we have the times and dates of the appointments at least 3 days in advance.   Next appt 07/18/15 at 8 AM, a ride will be arranged

## 2015-07-07 NOTE — Patient Instructions (Addendum)
07/18/2015 At 8 AM  Appointment Nephrology Caffie PintoMcLean, Nicholas Oliver, MD  [facility] [address]  [place], [place] [address]  [phone]  [phone])          10/05/2015 Appointment Rheumatology Luk, Thurston PoundsFrancis Cheuk Yin, MD  [facility] [address]  [place], [place] [address]  [phone]  [phone])    10/09/2015 Appointment Rheumatology Nydia BoutonLuk, Thurston PoundsFrancis Cheuk Yin, MD  [facility]  [place], [place] [address]  [phone]  [phone] (Fax)      Byrd HesselbachMaria was seen today for lupus.  Diagnoses and all orders for this visit:  Healthcare maintenance -     Flu Vaccine QUAD 36+ mos IM  On Depo-Provera for contraception -     medroxyPROGESTERone (DEPO-PROVERA) 150 MG/ML injection; Inject 1 mL (150 mg total) into the muscle every 3 (three) months. -     Discontinue: medroxyPROGESTERone (DEPO-PROVERA) injection 150 mg; Inject 1 mL (150 mg total) into the muscle once. -     POCT urine pregnancy  Lupus vasculitis (HCC) -     POCT urinalysis dipstick -     COMPLETE METABOLIC PANEL WITH GFR -     Discontinue: hydroxychloroquine (PLAQUENIL) 200 MG tablet; Take 1 tablet (200 mg total) by mouth daily. -     Discontinue: calcium carbonate (OS-CAL - DOSED IN MG OF ELEMENTAL CALCIUM) 1250 (500 CA) MG tablet; Take 1 tablet (1,250 mg total) by mouth daily with breakfast. Reported on 07/07/2015 -     Discontinue: Cholecalciferol (VITAMIN D-1000 MAX ST) 1000 UNITS tablet; Take 2 tablets (2,000 Units total) by mouth daily. Reported on 07/07/2015 -     Discontinue: mycophenolate (CELLCEPT) 500 MG tablet; Take 2 tablets (1,000 mg total) by mouth 2 (two) times daily. -     Discontinue: omeprazole (PRILOSEC) 40 MG capsule; Take 1 capsule (40 mg total) by mouth daily. -     Discontinue: lisinopril (PRINIVIL,ZESTRIL) 5 MG tablet; Take 1 tablet (5 mg total) by mouth daily. Reported on 07/07/2015 -     Discontinue: predniSONE (DELTASONE) 10 MG tablet; Take 1 tablet (10 mg total) by mouth  daily with breakfast. -     calcium carbonate (OS-CAL - DOSED IN MG OF ELEMENTAL CALCIUM) 1250 (500 CA) MG tablet; Take 1 tablet (1,250 mg total) by mouth daily with breakfast. Reported on 07/07/2015 -     Cholecalciferol (VITAMIN D-1000 MAX ST) 1000 UNITS tablet; Take 2 tablets (2,000 Units total) by mouth daily. Reported on 07/07/2015 -     hydroxychloroquine (PLAQUENIL) 200 MG tablet; Take 1 tablet (200 mg total) by mouth daily. -     lisinopril (PRINIVIL,ZESTRIL) 5 MG tablet; Take 1 tablet (5 mg total) by mouth daily. Reported on 07/07/2015 -     mycophenolate (CELLCEPT) 500 MG tablet; Take 2 tablets (1,000 mg total) by mouth 2 (two) times daily. -     omeprazole (PRILOSEC) 40 MG capsule; Take 1 capsule (40 mg total) by mouth daily. -     predniSONE (DELTASONE) 10 MG tablet; Take 1 tablet (10 mg total) by mouth daily with breakfast.  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 -     sulfamethoxazole-trimethoprim (BACTRIM DS,SEPTRA DS) 800-160 MG tablet; Take 1 tablet by mouth every Monday, Wednesday, and Friday.    F/u with me in 4 weeks for lupus   Dr. Armen PickupFunches

## 2015-07-07 NOTE — Progress Notes (Signed)
F/U Lupus  Body ache today  Pain scale #5 No tobacco user  No suicidal thought in the past two weeks

## 2015-07-07 NOTE — Progress Notes (Signed)
Patient ID: Wendy Kaufman, female   DOB: June 20, 1986, 29 y.o.   MRN: [id number]   Subjective:  Patient ID: Wendy Kaufman, female    DOB: [birth year]  Age: 29 y.o. MRN: [id number]  CC: Lupus   HPI Wendy Kaufman presents for   1. Lupus: patient with lupus and proteinuria. She had pericarditis and myocarditis as well. [facility] for lupus myocarditis and pericarditis. Due to lack of transportation she has had one app[facility] and no [facility] since her hospitalization. She comes today with her current medications which include prednisone 10 mg daily, cellcept 500 mg BID and plaquenil 200 mg daily. She also has an empty bottle of prilosec. She is not taking the prescribed bactrim, lisinopril, vit D or calcium. Of note, her last prednisone dose at hospital discharge was 60 mg. She transitioned back to 10 mg once she ran out of the 60 mg. She reports hoarseness and arthralgias. She denies fever, CP, SOB, rash, hematuria and swelling.    2. Depo contraception: patient has depo card. She due for next depo shot in mid February. She has been getting her dep[facility]. She has her depo card with her today.   Social History  Substance Use Topics  . Smoking status: Never Smoker   . Smokeless tobacco: Never Used  . Alcohol Use: 1.2 - 1.8 oz/week    2-3 Cans of beer per week    Outpatient Prescriptions Prior to Visit  Medication Sig Dispense Refill  . indomethacin (INDOCIN) 50 MG capsule Take 1 capsule (50 mg total) by mouth as directed. 42 capsule 0  . mycophenolate (CELLCEPT) 500 MG tablet Take 500 mg by mouth 2 (two) times daily.    Marland Kitchen. omeprazole (PRILOSEC) 40 MG capsule Take 1 capsule (40 mg total) by mouth 2 (two) times daily. 60 capsule 2  . predniSONE (DELTASONE) 10 MG tablet Take 1 tablet (10 mg total) by mouth daily with breakfast. 30 tablet 5    No facility-administered medications prior to visit.    ROS Review of Systems  Constitutional: Negative for fever and chills.  HENT: Positive for voice change.   Eyes: Negative for visual disturbance.  Respiratory: Negative for shortness of breath.   Cardiovascular: Negative for chest pain.  Gastrointestinal: Negative for abdominal pain and blood in stool.  Genitourinary: Negative for hematuria.  Musculoskeletal: Positive for arthralgias. Negative for back pain.  Skin: Negative for rash.  Allergic/Immunologic: Negative for immunocompromised state.  Hematological: Negative for adenopathy. Does not bruise/bleed easily.  Psychiatric/Behavioral: Negative for suicidal ideas and dysphoric mood.    Objective:  BP 90/62 mmHg  Pulse 97  Temp(Src) 98.4 F (36.9 C) (Oral)  Resp 16  Ht 5\' 1"  (1.549 m)  Wt 121 lb (54.885 kg)  BMI 22.87 kg/m2  SpO2 99%  BP/Weight 07/07/2015 03/24/2015 02/21/2015  Systolic BP 90 118 98  Diastolic BP 62 64 62  Wt. (Lbs) 121 108.8 101  BMI 22.87 21.25 19.09   Physical Exam  Constitutional: She is oriented to person, place, and time. She appears well-developed and well-nourished. No distress.  Face is round   HENT:  Head: Normocephalic and atraumatic.  Cardiovascular: Normal rate, regular rhythm, normal heart sounds and intact distal pulses.  Exam reveals no gallop and no friction rub.   No murmur heard. Pulmonary/Chest: Effort normal and breath sounds normal.  Musculoskeletal: She exhibits no edema.  Neurological:  She is alert and oriented to person, place, and time.  Skin: Skin is warm and dry. No rash noted.  Psychiatric: She has a normal mood and affect.   UA: small blood, > 300 protein, sp grav > 1.030, otherwise normal U preg: negative  Assessment & Plan:   Problem List Items Addressed This Visit    Assistance with transportation (Chronic)    Patient misses important f/u appt with her rheumatologist and [facility] due to a lack  of transportation. Patient is aware that we will coordinate rides to her appts at [facility] as long as we have the times and dates of the appointments at least 3 days in advance.   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 Bactrim ordered, patient is aware that she is to take bactrim for infection prevention on MWF.       Relevant Medications   sulfamethoxazole-trimethoprim (BACTRIM DS,SEPTRA DS) 800-160 MG tablet   Lupus vasculitis (HCC) (Chronic)    A: Patient with lupus. She is partially compliant. She is motivated to get and stay well. Her major limitation is transportation to f/u appts.   P: coordinate care with nephrology and rheumatology.  I spoke the [facility] Close, NP who saw patient last.  Patient will be started on lisinopril today for proteinuria. She will f.u with nephrology on 07/18/15 for labs and evaluation.  I have continued prednisone 10 mg daily and have called patient's rheumatologist to determine if a higher dose if preferred and if a closer f/u appt can be scheduled, preferably on the same date as her nephrology appt.        Relevant Medications   calcium carbonate (OS-CAL - DOSED IN MG OF ELEMENTAL CALCIUM) 1250 (500 CA) MG tablet   Cholecalciferol (VITAMIN D-1000 MAX ST) 1000 UNITS tablet   hydroxychloroquine (PLAQUENIL) 200 MG tablet   lisinopril (PRINIVIL,ZESTRIL) 5 MG tablet   mycophenolate (CELLCEPT) 500 MG tablet   omeprazole (PRILOSEC) 40 MG capsule   predniSONE (DELTASONE) 10 MG tablet   Other Relevant Orders   POCT urinalysis dipstick (Completed)   COMPLETE METABOLIC PANEL WITH GFR   On Depo-Provera for contraception (Chronic)    On depo. Next injection due mid Feb 2017. Patient advised to come here for depo so we can track compliance.       Relevant Medications    medroxyPROGESTERone (DEPO-PROVERA) 150 MG/ML injection   Other Relevant Orders   POCT urine pregnancy (Completed)   Proteinuria (Chronic)   Relevant Medications   lisinopril (PRINIVIL,ZESTRIL) 5 MG tablet   Other Relevant Orders   Protein / creatinine ratio, urine   Spanish speaking patient (Chronic)    Other Visit Diagnoses    Healthcare maintenance    -  Primary    Relevant Orders    Flu Vaccine QUAD 36+ mos IM (Completed)      45 minutes of 75 minute visit spent in care everywhere and one the phone coordinating patient care  No orders of the defined types were placed in this encounter.    Follow-up: Return in about 4 weeks (around 08/04/2015) for lupus .   Dessa Phi MD

## 2015-07-07 NOTE — Assessment & Plan Note (Signed)
Immunocompromised due to cellcept, plaquenil and prednisone, bactrim recommended for PJP prophylaxis. Bactrim ordered, patient is aware that she is to take bactrim for infection prevention on MWF.

## 2015-07-08 LAB — PROTEIN / CREATININE RATIO, URINE
CREATININE, URINE: 165 mg/dL (ref 20–320)
PROTEIN CREATININE RATIO: 897 mg/g{creat} — AB (ref 21–161)
Total Protein, Urine: 148 mg/dL — ABNORMAL HIGH (ref 5–24)

## 2015-07-10 ENCOUNTER — Telehealth: Payer: Self-pay | Admitting: Family Medicine

## 2015-07-10 NOTE — Telephone Encounter (Signed)
Wendy Kaufman,  Wendy Kaufman needs transportation to and [facility] see below for info Please notify when transportation has been arranged and patient has been notified.   07/18/2015 At 8 AM  Appointment Nephrology Caffie PintoMcLean, Nicholas Oliver, MD  [facility] [address]  [place], [place] [address]  [phone]  [phone])

## 2015-07-14 NOTE — Telephone Encounter (Signed)
Tried to contact patient to let her know of transportation being arranged, left message letting patient know to be ready for pick up at 7:15am.

## 2015-07-27 ENCOUNTER — Telehealth: Payer: Self-pay | Admitting: *Deleted

## 2015-07-27 NOTE — Telephone Encounter (Signed)
-----   Message from Dessa PhiJosalyn Funches, MD sent at 07/10/2015  9:25 AM EST ----- Please call patient Urine protein/creatinine ratio is high the lisinopril will help with this CMP normal  Please review meds with patient and mark taking if she is taking, please let me know.

## 2015-07-27 NOTE — Telephone Encounter (Signed)
Date of birth verified by pt  Lab results given. Medication review with pt and medicinal list  Not taking vit d  Taking Mycophenolate 500mg  and CELLCEPT 500mg   Advised to stop taking one of them since pt is duplicating mediation  Come to our office with all medication to verified if mediation  Information was given in [place]

## 2015-07-27 NOTE — Telephone Encounter (Signed)
LVM to return call.

## 2015-07-31 ENCOUNTER — Emergency Department (HOSPITAL_COMMUNITY)
Admission: EM | Admit: 2015-07-31 | Discharge: 2015-07-31 | Disposition: A | Discharge status: Home / Self Care | Payer: Self-pay | Attending: Emergency Medicine | Admitting: Emergency Medicine

## 2015-07-31 ENCOUNTER — Encounter (HOSPITAL_COMMUNITY): Payer: Self-pay | Admitting: *Deleted

## 2015-07-31 DIAGNOSIS — Y9389 Activity, other specified: Secondary | ICD-10-CM | POA: Insufficient documentation

## 2015-07-31 DIAGNOSIS — Y998 Other external cause status: Secondary | ICD-10-CM | POA: Insufficient documentation

## 2015-07-31 DIAGNOSIS — Z793 Long term (current) use of hormonal contraceptives: Secondary | ICD-10-CM | POA: Insufficient documentation

## 2015-07-31 DIAGNOSIS — L02411 Cutaneous abscess of right axilla: Secondary | ICD-10-CM | POA: Insufficient documentation

## 2015-07-31 DIAGNOSIS — Z8679 Personal history of other diseases of the circulatory system: Secondary | ICD-10-CM | POA: Insufficient documentation

## 2015-07-31 DIAGNOSIS — X58XXXA Exposure to other specified factors, initial encounter: Secondary | ICD-10-CM | POA: Insufficient documentation

## 2015-07-31 DIAGNOSIS — Y9289 Other specified places as the place of occurrence of the external cause: Secondary | ICD-10-CM | POA: Insufficient documentation

## 2015-07-31 DIAGNOSIS — L0291 Cutaneous abscess, unspecified: Secondary | ICD-10-CM

## 2015-07-31 DIAGNOSIS — S41101A Unspecified open wound of right upper arm, initial encounter: Secondary | ICD-10-CM | POA: Insufficient documentation

## 2015-07-31 DIAGNOSIS — Z79899 Other long term (current) drug therapy: Secondary | ICD-10-CM | POA: Insufficient documentation

## 2015-07-31 DIAGNOSIS — Z7952 Long term (current) use of systemic steroids: Secondary | ICD-10-CM | POA: Insufficient documentation

## 2015-07-31 DIAGNOSIS — M329 Systemic lupus erythematosus, unspecified: Secondary | ICD-10-CM | POA: Insufficient documentation

## 2015-07-31 MED ORDER — SULFAMETHOXAZOLE-TRIMETHOPRIM 800-160 MG PO TABS: 1.0000 | ORAL_TABLET | Freq: Two times a day (BID) | ORAL | Status: AC

## 2015-07-31 NOTE — ED Provider Notes (Signed)
CSN: [id number]     Arrival date & time 07/31/15  1743 History  By signing my name below, I, Tanda Rockers, attest that this documentation has been prepared under the direction and in the presence of Melburn Hake, PA-C. Electronically Signed: Tanda Rockers, ED Scribe. 07/31/2015. 7:09 PM.   Chief Complaint  Patient presents with  . Abscess   The history is provided by the patient. The history is limited by a language barrier. A language interpreter was used.     HPI Comments: Wendy Kaufman is a 30 y.o. female who presents to the Emergency Department complaining of abscess to right axilla x 3 days. Pt states that the abscess popped on its own 1 day ago and has since been draining blood. She notes gradual onset, constant, mild, pain and tenderness to the area as well. She has not been applying any ointment to the area. Denies fever, redness around the abscess, swelling, numbness, tingling or any other associated symptoms.   Past Medical History  Diagnosis Date  . Medical history non-contributory   . Lupus (HCC) 03/09/14  . Lupus (HCC)   . 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 Axillary lymph node biopsy Right 03/11/2014    Procedure: AXILLARY LYMPH NODE BIOPSY;  Surgeon: Wilmon Arms. Corliss Skains, MD;  Location: [facility] OR;  Service: General;  Laterality: Right;   Family History  Problem Relation Age of Onset  . Lupus Neg Hx   . Bronchitis Brother   . Bronchitis Brother    Social History  Substance Use Topics  . Smoking status: Never Smoker   . Smokeless tobacco: Never Used  . Alcohol Use: 1.2 - 1.8 oz/week    2-3 Cans of beer per week   OB History    Gravida Para Term Preterm AB TAB SAB Ectopic Multiple Living   3 3 3  0 0 0 0 0  1     Review of Systems  Constitutional: Negative for fever.  Skin:       + Abscess to right axilla with drainage Negative for redness or swelling  All other systems reviewed and are negative.  Allergies   Review of patient's allergies indicates no known allergies.  Home Medications   Prior to Admission medications   Medication Sig Start Date End Date Taking? Authorizing Provider  calcium carbonate (OS-CAL - DOSED IN MG OF ELEMENTAL CALCIUM) 1250 (500 CA) MG tablet Take 1 tablet (1,250 mg total) by mouth daily with breakfast. Reported on 07/07/2015 07/07/15   Dessa Phi, MD  Cholecalciferol (VITAMIN D-1000 MAX ST) 1000 UNITS tablet Take 2 tablets (2,000 Units total) by mouth daily. Reported on 07/07/2015 07/07/15   Dessa Phi, MD  hydroxychloroquine (PLAQUENIL) 200 MG tablet Take 1 tablet (200 mg total) by mouth daily. 07/07/15   Josalyn Funches, MD  lisinopril (PRINIVIL,ZESTRIL) 5 MG tablet Take 1 tablet (5 mg total) by mouth daily. Reported on 07/07/2015 07/07/15   Dessa Phi, MD  medroxyPROGESTERone (DEPO-PROVERA) 150 MG/ML injection Inject 1 mL (150 mg total) into the muscle every 3 (three) months. 07/07/15   Dessa Phi, MD  mycophenolate (CELLCEPT) 500 MG tablet Take 2 tablets (1,000 mg total) by mouth 2 (two) times daily. 07/07/15   Josalyn Funches, MD  omeprazole (PRILOSEC) 40 MG capsule Take 1 capsule (40 mg total) by mouth daily. 07/07/15   Josalyn Funches, MD  predniSONE (DELTASONE) 10 MG tablet Take 1 tablet (10 mg total) by mouth daily with breakfast. 07/07/15  Dessa PhiJosalyn Funches, MD  sulfamethoxazole-trimethoprim (BACTRIM DS,SEPTRA DS) 800-160 MG tablet Take 1 tablet by mouth 2 (two) times daily. 07/31/15 08/07/15  Barrett HenleNicole Elizabeth Nadeau, PA-C   Triage Vitals:  BP 111/71 mmHg  Pulse 115  Temp(Src) 98 F (36.7 C) (Oral)  Resp 18  Ht 5' (1.524 m)  Wt 117 lb 4.8 oz (53.207 kg)  BMI 22.91 kg/m2  SpO2 97%   Physical Exam  Constitutional: She is oriented to person, place, and time. She appears well-developed and well-nourished. No distress.  HENT:  Head: Normocephalic and atraumatic.  Eyes: Conjunctivae and EOM are normal. Right eye exhibits no discharge. Left  eye exhibits no discharge. No scleral icterus.  Neck: Normal range of motion. Neck supple. No tracheal deviation present.  Cardiovascular: Normal rate, regular rhythm, normal heart sounds and intact distal pulses.   Pulmonary/Chest: Effort normal and breath sounds normal. No respiratory distress.  Abdominal: Soft. Bowel sounds are normal. She exhibits no distension. There is no tenderness.  Musculoskeletal: Normal range of motion.  Neurological: She is alert and oriented to person, place, and time.  Skin: Skin is warm and dry.  Small open wound noted to right axilla with no surrounding swelling, induration, fluctuance, erythema, or warmth Small amount of sanguinous drainage noted   Psychiatric: She has a normal mood and affect. Her behavior is normal.  Nursing note and vitals reviewed.   ED Course  Procedures (including critical care time)  INCISION AND DRAINAGE PROCEDURE NOTE: Patient identification was confirmed and verbal consent was obtained. This procedure was performed by Melburn HakeNicole Nadeau at 7:06 PM. Site: Right axilla Sterile procedures observed Drainage: Small amount of purulent drainage Complexity: Simple Packing used: No packing Site anesthetized, incision made over site, wound drained and explored loculations, rinsed with copious amounts of normal saline. Pt tolerated procedure well without complications.  Instructions for care discussed verbally and pt provided with additional written instructions for homecare and f/u.   DIAGNOSTIC STUDIES: Oxygen Saturation is 97% on RA, normal by my interpretation.    COORDINATION OF CARE: 6:58 PM-Discussed treatment plan which includes Rx antibiotics with pt at bedside and pt agreed to plan.   Labs Review Labs Reviewed - No data to display  Imaging Review No results found.    MDM   Final diagnoses:  Abscess   Patient with skin abscess, no evidence of surrounding cellulitis. Due to wound already draining, I irrigated  abscess with normal saline. Supportive care and return precautions discussed.  Pt sent home with Bactrim. Evaluation does not show pathology requring ongoing emergent intervention or admission. Pt is hemodynamically stable and mentating appropriately. Discussed findings/results and plan with patient/guardian, who agrees with plan. All questions answered. Return precautions discussed and outpatient follow up given.     I personally performed the services described in this documentation, which was scribed in my presence. The recorded information has been reviewed and is accurate.     Satira Sarkicole Elizabeth BrownsvilleNadeau, [place] JerseyPA-C 07/31/15 1912  Linwood DibblesJon Knapp, MD 08/01/15 [phone]

## 2015-07-31 NOTE — ED Notes (Addendum)
Used translator phone, pt reports abscess under right arm x 3 days. Denies fever.

## 2015-07-31 NOTE — ED Notes (Signed)
Dry dressing applied to site on right axilla.

## 2015-07-31 NOTE — Discharge Instructions (Signed)
Take your medication as prescribed. Keep wound clean using soap and water and dry. Follow-up with your primary care provider in 3-4 days. Turn to the emergency department if symptoms worsen or new onset of fever, drainage, redness, swelling, warmth.

## 2015-08-09 ENCOUNTER — Telehealth: Payer: Self-pay | Admitting: Family Medicine

## 2015-08-09 MED FILL — ?PREDNISONE 10 MG TABLET: 10 | 30 days supply | Qty: 30 | Fill #1

## 2015-08-09 MED FILL — OMEPRAZOLE DR 40 MG CAPSULE: 40 | 30 days supply | Qty: 30 | Fill #1

## 2015-08-15 ENCOUNTER — Ambulatory Visit: Payer: Self-pay | Admitting: Family Medicine

## 2015-08-31 ENCOUNTER — Ambulatory Visit (HOSPITAL_BASED_OUTPATIENT_CLINIC_OR_DEPARTMENT_OTHER): Payer: Self-pay | Admitting: Family Medicine

## 2015-08-31 DIAGNOSIS — Z0000 Encounter for general adult medical examination without abnormal findings: Secondary | ICD-10-CM

## 2015-08-31 DIAGNOSIS — R809 Proteinuria, unspecified: Secondary | ICD-10-CM

## 2015-09-01 NOTE — Progress Notes (Signed)
Encounter Opened in error. Patient called and canceled appointment same day.

## 2015-09-06 ENCOUNTER — Telehealth: Payer: Self-pay | Admitting: Family Medicine

## 2015-09-06 NOTE — Telephone Encounter (Signed)
Patient came in requesting a medication refill for Omeprazole and Prednisone, Please follow up.

## 2015-09-07 ENCOUNTER — Other Ambulatory Visit: Payer: Self-pay | Admitting: *Deleted

## 2015-09-07 DIAGNOSIS — M3219 Other organ or system involvement in systemic lupus erythematosus: Secondary | ICD-10-CM

## 2015-09-07 DIAGNOSIS — M329 Systemic lupus erythematosus, unspecified: Principal | ICD-10-CM

## 2015-09-07 MED FILL — predniSONE 10 MG TABS: 10 | 30 days supply | Qty: 30 | Fill #2

## 2015-09-07 MED FILL — OMEPRAZOLE DR 40 MG CAPSULE: 40 | 30 days supply | Qty: 30 | Fill #2

## 2015-09-07 NOTE — Telephone Encounter (Signed)
LVM pt has refills at [facility] pharmacy  Pharmacy notified pt needing refills  Advised pt to call pharmacy

## 2015-09-18 ENCOUNTER — Ambulatory Visit: Payer: Self-pay | Attending: Family Medicine | Admitting: Family Medicine

## 2015-09-18 ENCOUNTER — Encounter: Payer: Self-pay | Admitting: Family Medicine

## 2015-09-18 VITALS — BP 86/54 | HR 87 | Temp 99.1°F | Resp 16 | Ht 61.0 in | Wt 116.0 lb

## 2015-09-18 DIAGNOSIS — R079 Chest pain, unspecified: Secondary | ICD-10-CM

## 2015-09-18 DIAGNOSIS — M3219 Other organ or system involvement in systemic lupus erythematosus: Secondary | ICD-10-CM

## 2015-09-18 DIAGNOSIS — M329 Systemic lupus erythematosus, unspecified: Secondary | ICD-10-CM

## 2015-09-18 DIAGNOSIS — M3214 Glomerular disease in systemic lupus erythematosus: Secondary | ICD-10-CM

## 2015-09-18 DIAGNOSIS — Z0000 Encounter for general adult medical examination without abnormal findings: Secondary | ICD-10-CM

## 2015-09-18 DIAGNOSIS — R809 Proteinuria, unspecified: Secondary | ICD-10-CM

## 2015-09-18 DIAGNOSIS — Z3042 Encounter for surveillance of injectable contraceptive: Secondary | ICD-10-CM

## 2015-09-18 LAB — CBC WITH DIFFERENTIAL/PLATELET
BASOS ABS: 0 10*3/uL (ref 0.0–0.1)
Basophils Relative: 0 % (ref 0–1)
EOS PCT: 0 % (ref 0–5)
Eosinophils Absolute: 0 10*3/uL (ref 0.0–0.7)
HEMATOCRIT: 36.6 % (ref 36.0–46.0)
Hemoglobin: 12.2 g/dL (ref 12.0–15.0)
LYMPHS ABS: 0.9 10*3/uL (ref 0.7–4.0)
LYMPHS PCT: 9 % — AB (ref 12–46)
MCH: 29.2 pg (ref 26.0–34.0)
MCHC: 33.3 g/dL (ref 30.0–36.0)
MCV: 87.6 fL (ref 78.0–100.0)
MPV: 9.1 fL (ref 8.6–12.4)
Monocytes Absolute: 0.5 10*3/uL (ref 0.1–1.0)
Monocytes Relative: 5 % (ref 3–12)
Neutro Abs: 8.2 10*3/uL — ABNORMAL HIGH (ref 1.7–7.7)
Neutrophils Relative %: 86 % — ABNORMAL HIGH (ref 43–77)
Platelets: 705 10*3/uL — ABNORMAL HIGH (ref 150–400)
RBC: 4.18 MIL/uL (ref 3.87–5.11)
RDW: 15.2 % (ref 11.5–15.5)
WBC: 9.5 10*3/uL (ref 4.0–10.5)

## 2015-09-18 LAB — POCT URINALYSIS DIPSTICK
Bilirubin, UA: NEGATIVE
GLUCOSE UA: NEGATIVE
KETONES UA: NEGATIVE
Leukocytes, UA: NEGATIVE
Nitrite, UA: NEGATIVE
Protein, UA: 100
SPEC GRAV UA: 1.01
UROBILINOGEN UA: 0.2
pH, UA: 6.5

## 2015-09-18 LAB — POCT GLYCOSYLATED HEMOGLOBIN (HGB A1C): Hemoglobin A1C: 5.2

## 2015-09-18 MED ORDER — LISINOPRIL 5 MG PO TABS: 5.0000 mg | ORAL_TABLET | Freq: Every day | ORAL | Status: DC

## 2015-09-18 MED ORDER — PREDNISONE 20 MG PO TABS: 20.0000 mg | ORAL_TABLET | Freq: Every day | ORAL | Status: DC

## 2015-09-18 MED FILL — predniSONE 20 MG TABS: 20 | 30 days supply | Qty: 30 | Fill #0

## 2015-09-18 MED FILL — LISINOPRIL 5 MG TABLET: 5 | 30 days supply | Qty: 30 | Fill #0

## 2015-09-18 NOTE — Assessment & Plan Note (Signed)
A: patient with lupus with hx of pericarditis, nephritis, pleural effusions. She has 2 weeks of anterior CP P: Increase prednisone from 10 mg to 40 mg daily x 4 days, 20 mg daily after that  Continue all other meds at same doses Clinic will arrange rheumatology appt transportation  CBC and CXR ordered today

## 2015-09-18 NOTE — Assessment & Plan Note (Signed)
A: lupus nephritis with proteinuria, had improved on lisinopril, nephrology note reviewed P: Refill lisinopril

## 2015-09-18 NOTE — Progress Notes (Signed)
F/U Lupus Stated has shoulder and chest x 2 weeks  Pain worsen at night  Pain scale #5 No tobacco user  No suicidal thoughts in the past two weeks

## 2015-09-18 NOTE — Assessment & Plan Note (Signed)
A: U preg with equivocal results on 1 of 3 test P: Beta HCG test Depo pending negative beta HCG

## 2015-09-18 NOTE — Patient Instructions (Addendum)
10/05/2015 At 2 PM Appointment Rheumatology Wendy Kaufman, Wendy Pounds, MD  [facility] [address]  [place], [place] [address]  [phone]  [phone] (Fax)     Please arrange transportation for rheumatology appt, see above.   Diagnoses and all orders for this visit:  Proteinuria -     POCT urinalysis dipstick -     Protein / creatinine ratio, urine -     lisinopril (PRINIVIL,ZESTRIL) 5 MG tablet; Take 1 tablet (5 mg total) by mouth daily. Reported on 07/07/2015  Healthcare maintenance -     HgB A1c  Chest pain, unspecified chest pain type -     DG Chest 2 View; Future -     CBC with Differential  Lupus vasculitis (HCC) -     Discontinue: predniSONE (DELTASONE) 20 MG tablet; Take 1 tablet (20 mg total) by mouth daily with breakfast. -     predniSONE (DELTASONE) 20 MG tablet; Take 1 tablet (20 mg total) by mouth daily with breakfast. -     lisinopril (PRINIVIL,ZESTRIL) 5 MG tablet; Take 1 tablet (5 mg total) by mouth daily. Reported on 07/07/2015  On Depo-Provera for contraception -     POCT urine pregnancy    Increase prednisone dose for 10 mg to 40 mg for 4 days, then 20 mg daily   Take 40 mg daily for 4 days, then 20 mg daily  40 mg 09/19/15 40 mg 09/20/15 40 mg 09/21/15 40 mg 09/22/15  Then 20 mg daily starting 09/23/15   F/u in 3-4 days for depo shot F/u in 6 weeks for pap smear   Dr. Armen Pickup

## 2015-09-18 NOTE — Progress Notes (Signed)
Subjective:  Patient ID: Wendy Kaufman, female    DOB: April 11, 1986  Age: 30 y.o. MRN: [id number]  CC: Lupus   HPI TAMILYN LUPIEN presents for   1. Lupus: she is compliant with all medications except lisinopril. She reports chest pain and anterior shoulder pains x 2 weeks. She has rheumatology appointment on 10/05/2014. No fever or chills. No known sick contacts.   2. Depo: she is past due for depo. [facility]. She request a shot today.   Social History  Substance Use Topics  . Smoking status: Never Smoker   . Smokeless tobacco: Never Used  . Alcohol Use: 1.2 - 1.8 oz/week    2-3 Cans of beer per week   Outpatient Prescriptions Prior to Visit  Medication Sig Dispense Refill  . calcium carbonate (OS-CAL - DOSED IN MG OF ELEMENTAL CALCIUM) 1250 (500 CA) MG tablet Take 1 tablet (1,250 mg total) by mouth daily with breakfast. Reported on 07/07/2015 90 tablet 2  . Cholecalciferol (VITAMIN D-1000 MAX ST) 1000 UNITS tablet Take 2 tablets (2,000 Units total) by mouth daily. Reported on 07/07/2015 180 tablet 2  . hydroxychloroquine (PLAQUENIL) 200 MG tablet Take 1 tablet (200 mg total) by mouth daily. 90 tablet 2  . lisinopril (PRINIVIL,ZESTRIL) 5 MG tablet Take 1 tablet (5 mg total) by mouth daily. Reported on 07/07/2015 30 tablet 3  . medroxyPROGESTERone (DEPO-PROVERA) 150 MG/ML injection Inject 1 mL (150 mg total) into the muscle every 3 (three) months. 1 mL   . mycophenolate (CELLCEPT) 500 MG tablet Take 2 tablets (1,000 mg total) by mouth 2 (two) times daily. 180 tablet 2  . omeprazole (PRILOSEC) 40 MG capsule Take 1 capsule (40 mg total) by mouth daily. 90 capsule 2  . predniSONE (DELTASONE) 10 MG tablet Take 1 tablet (10 mg total) by mouth daily with breakfast. 30 tablet 5   No facility-administered medications prior to visit.    ROS Review of Systems  Constitutional: Negative for fever and chills.  HENT: Positive for sore  throat and voice change.   Eyes: Negative for visual disturbance.  Respiratory: Negative for shortness of breath.   Cardiovascular: Positive for chest pain.  Gastrointestinal: Negative for abdominal pain and blood in stool.  Genitourinary: Negative for hematuria.  Musculoskeletal: Positive for arthralgias. Negative for back pain.  Skin: Negative for rash.  Allergic/Immunologic: Negative for immunocompromised state.  Neurological: Negative for dizziness and light-headedness.  Hematological: Negative for adenopathy. Does not bruise/bleed easily.  Psychiatric/Behavioral: Negative for suicidal ideas and dysphoric mood.    Objective:  BP 86/54 mmHg  Pulse 87  Temp(Src) 99.1 F (37.3 C) (Oral)  Resp 16  Ht  (1.549 m)  Wt 116 lb (52.617 kg)  BMI 21.93 kg/m2  SpO2 99%  BP/Weight 09/18/2015 07/31/2015 07/07/2015  Systolic BP 86 107 90  Diastolic BP 54 60 62  Wt. (Lbs) 116 117.3 121  BMI 21.93 22.91 22.87   Physical Exam  Constitutional: She is oriented to person, place, and time. She appears well-developed and well-nourished. No distress.  Face is round   HENT:  Head: Normocephalic and atraumatic.  Cardiovascular: Normal rate, regular rhythm, normal heart sounds and intact distal pulses.  Exam reveals no gallop and no friction rub.   No murmur heard. Pulmonary/Chest: Effort normal and breath sounds normal.  Musculoskeletal: She exhibits no edema.  Neurological: She is alert and oriented to person, place, and time.  Skin: Skin is warm and dry. No  rash noted.  Psychiatric: She has a normal mood and affect.    Lab Results  Component Value Date   HGBA1C 5.2 09/18/2015   U preg: equivocal   Assessment & Plan:   Diagnoses and all orders for this visit:  Proteinuria -     POCT urinalysis dipstick -     Protein / creatinine ratio, urine -     lisinopril (PRINIVIL,ZESTRIL) 5 MG tablet; Take 1 tablet (5 mg total) by mouth daily. Reported on 07/07/2015  Healthcare  maintenance -     HgB A1c  Chest pain, unspecified chest pain type -     DG Chest 2 View; Future -     CBC with Differential  Lupus vasculitis (HCC) -     Discontinue: predniSONE (DELTASONE) 20 MG tablet; Take 1 tablet (20 mg total) by mouth daily with breakfast. -     predniSONE (DELTASONE) 20 MG tablet; Take 1 tablet (20 mg total) by mouth daily with breakfast. -     lisinopril (PRINIVIL,ZESTRIL) 5 MG tablet; Take 1 tablet (5 mg total) by mouth daily. Reported on 07/07/2015  On Depo-Provera for contraception -     POCT urine pregnancy -     hCG, quantitative, pregnancy    No orders of the defined types were placed in this encounter.    Follow-up: No Follow-up on file.   Dessa Phi MD

## 2015-09-19 LAB — PROTEIN / CREATININE RATIO, URINE
CREATININE, URINE: 40 mg/dL (ref 20–320)
Protein Creatinine Ratio: 850 mg/g creat — ABNORMAL HIGH (ref 21–161)
Total Protein, Urine: 34 mg/dL — ABNORMAL HIGH (ref 5–24)

## 2015-09-19 LAB — HCG, QUANTITATIVE, PREGNANCY: hCG, Beta Chain, Quant, S: <2 m[IU]/mL

## 2015-09-20 ENCOUNTER — Telehealth: Payer: Self-pay | Admitting: *Deleted

## 2015-09-20 NOTE — Telephone Encounter (Signed)
-----   Message from Dessa Phi, MD sent at 09/19/2015  8:46 AM EST ----- Beta HCG negative, patient should come in for depo shot Lower urine protein levels which is an improvement, continue lisinopril Normal WBC on CBC Please get CXR done at earliest convenience

## 2015-09-20 NOTE — Telephone Encounter (Signed)
LVM to return call   Please schedule nurse appointment for Depo Inj

## 2015-09-20 NOTE — Telephone Encounter (Signed)
Patient returned nurse's call....please follow up

## 2015-09-22 NOTE — Telephone Encounter (Signed)
LVM to return call.

## 2015-09-25 ENCOUNTER — Ambulatory Visit: Payer: Self-pay | Attending: Family Medicine | Admitting: *Deleted

## 2015-09-25 DIAGNOSIS — Z0000 Encounter for general adult medical examination without abnormal findings: Secondary | ICD-10-CM

## 2015-09-25 DIAGNOSIS — Z3042 Encounter for surveillance of injectable contraceptive: Secondary | ICD-10-CM | POA: Insufficient documentation

## 2015-09-25 LAB — POCT URINE PREGNANCY: Preg Test, Ur: NEGATIVE

## 2015-09-25 MED ORDER — MEDROXYPROGESTERONE ACETATE 150 MG/ML IM SUSP
150.0000 mg | Freq: Once | INTRAMUSCULAR | Status: AC
  Administered 2015-09-25: 150 mg via INTRAMUSCULAR

## 2015-09-25 NOTE — Patient Instructions (Signed)
Patient next injection to be given between the dates of Dec 11 2015- December 25 2015.

## 2015-09-25 NOTE — Addendum Note (Signed)
Addended by: Margaretmary LombardLISBON, NUBIA K on: 09/25/2015 03:47 PM   Modules accepted: Orders

## 2015-09-25 NOTE — Progress Notes (Signed)
Patient here for DEPO INJECTION  Patient POCT PT was negative Patient tolerated injection well

## 2015-10-19 MED FILL — MYCOPHENOLATE 500 MG TABLET: 500 | 45 days supply | Qty: 180 | Fill #1

## 2015-10-19 MED FILL — predniSONE 20 MG TABS: 20 | 30 days supply | Qty: 30 | Fill #1

## 2015-11-21 MED FILL — predniSONE 20 MG TABS: 20 | 30 days supply | Qty: 30 | Fill #2

## 2015-12-07 ENCOUNTER — Other Ambulatory Visit: Payer: Self-pay | Admitting: Family Medicine

## 2015-12-08 ENCOUNTER — Encounter: Payer: Self-pay | Admitting: Family Medicine

## 2015-12-08 ENCOUNTER — Other Ambulatory Visit: Payer: Self-pay | Admitting: Family Medicine

## 2015-12-08 ENCOUNTER — Telehealth: Payer: Self-pay | Admitting: Family Medicine

## 2015-12-08 ENCOUNTER — Ambulatory Visit: Payer: Self-pay | Attending: Family Medicine | Admitting: Family Medicine

## 2015-12-08 VITALS — BP 98/65 | HR 87 | Temp 98.5°F | Resp 16 | Ht 61.0 in | Wt 114.0 lb

## 2015-12-08 DIAGNOSIS — R490 Dysphonia: Secondary | ICD-10-CM | POA: Insufficient documentation

## 2015-12-08 DIAGNOSIS — I776 Arteritis, unspecified: Secondary | ICD-10-CM | POA: Insufficient documentation

## 2015-12-08 DIAGNOSIS — Z124 Encounter for screening for malignant neoplasm of cervix: Secondary | ICD-10-CM

## 2015-12-08 DIAGNOSIS — Z01411 Encounter for gynecological examination (general) (routine) with abnormal findings: Secondary | ICD-10-CM | POA: Insufficient documentation

## 2015-12-08 DIAGNOSIS — L659 Nonscarring hair loss, unspecified: Secondary | ICD-10-CM

## 2015-12-08 DIAGNOSIS — M329 Systemic lupus erythematosus, unspecified: Secondary | ICD-10-CM | POA: Insufficient documentation

## 2015-12-08 DIAGNOSIS — R809 Proteinuria, unspecified: Secondary | ICD-10-CM | POA: Insufficient documentation

## 2015-12-08 DIAGNOSIS — D509 Iron deficiency anemia, unspecified: Secondary | ICD-10-CM

## 2015-12-08 DIAGNOSIS — Z79899 Other long term (current) drug therapy: Secondary | ICD-10-CM | POA: Insufficient documentation

## 2015-12-08 DIAGNOSIS — M3219 Other organ or system involvement in systemic lupus erythematosus: Secondary | ICD-10-CM

## 2015-12-08 MED ORDER — LISINOPRIL 5 MG PO TABS: 5.0000 mg | ORAL_TABLET | Freq: Every day | ORAL | Status: DC

## 2015-12-08 MED ORDER — SULFAMETHOXAZOLE-TRIMETHOPRIM 800-160 MG PO TABS: 1.0000 | ORAL_TABLET | Freq: Two times a day (BID) | ORAL | Status: DC

## 2015-12-08 MED ORDER — HYDROXYCHLOROQUINE SULFATE 200 MG PO TABS: 200.0000 mg | ORAL_TABLET | Freq: Every day | ORAL | Status: AC

## 2015-12-08 MED ORDER — MYCOPHENOLATE MOFETIL 500 MG PO TABS: 1000.0000 mg | ORAL_TABLET | Freq: Two times a day (BID) | ORAL | Status: DC

## 2015-12-08 MED ORDER — PREDNISONE 20 MG PO TABS: 20.0000 mg | ORAL_TABLET | Freq: Every day | ORAL | Status: DC

## 2015-12-08 MED ORDER — OMEPRAZOLE 40 MG PO CPDR: 40.0000 mg | DELAYED_RELEASE_CAPSULE | Freq: Every day | ORAL | Status: DC

## 2015-12-08 NOTE — Patient Instructions (Addendum)
Wendy HesselbachMaria was seen today for gynecologic exam.  Diagnoses and all orders for this visit:  Papanicolaou smear for cervical cancer screening -     Cytology - PAP  Lupus vasculitis (HCC) -     predniSONE (DELTASONE) 20 MG tablet; Take 1 tablet (20 mg total) by mouth daily with breakfast. -     omeprazole (PRILOSEC) 40 MG capsule; Take 1 capsule (40 mg total) by mouth daily. -     hydroxychloroquine (PLAQUENIL) 200 MG tablet; Take 1 tablet (200 mg total) by mouth daily. -     mycophenolate (CELLCEPT) 500 MG tablet; Take 2 tablets (1,000 mg total) by mouth 2 (two) times daily. -     lisinopril (PRINIVIL,ZESTRIL) 5 MG tablet; Take 1 tablet (5 mg total) by mouth daily. Reported on 07/07/2015 -     Discontinue: sulfamethoxazole-trimethoprim (BACTRIM DS,SEPTRA DS) 800-160 MG tablet; Take 1 tablet by mouth 2 (two) times daily. -     BASIC METABOLIC PANEL WITH GFR; Future -     sulfamethoxazole-trimethoprim (BACTRIM DS,SEPTRA DS) 800-160 MG tablet; Take 1 tablet by mouth 3 (three) times a week. Every Monday, Wednesday and Friday  Proteinuria -     lisinopril (PRINIVIL,ZESTRIL) 5 MG tablet; Take 1 tablet (5 mg total) by mouth daily. Reported on 07/07/2015 -     BASIC METABOLIC PANEL WITH GFR; Future  Hoarseness of voice  Hair loss -     Biotin 1000 MCG tablet; Take 1 tablet (1 mg total) by mouth daily.     Patient next injection to be given between the dates of Dec 11 2015- December 25 2015.    F/u in 3 months with me for lupus   Dr. Armen PickupFunches

## 2015-12-08 NOTE — Progress Notes (Signed)
Subjective:  Patient ID: Wendy Kaufman, female    DOB: [birth year]  Age: 30 y.o. MRN: [id number]  CC: Gynecologic Exam   HPI Wendy Kaufman presents for    1. Pap: no vaginal discharge or abnormal bleeding. Using depo for contraception.  2. Lupus vasculitis with proteinuria: compliant with all meds. Missed rheumatology follow up in March 2017. Hoarseness has improved. Still losing hair. No weight change. No swelling.   Social History  Substance Use Topics  . Smoking status: Never Smoker   . Smokeless tobacco: Never Used  . Alcohol Use: 1.2 - 1.8 oz/week    2-3 Cans of beer per week    Outpatient Prescriptions Prior to Visit  Medication Sig Dispense Refill  . calcium carbonate (OS-CAL - DOSED IN MG OF ELEMENTAL CALCIUM) 1250 (500 CA) MG tablet Take 1 tablet (1,250 mg total) by mouth daily with breakfast. Reported on 07/07/2015 90 tablet 2  . Cholecalciferol (VITAMIN D-1000 MAX ST) 1000 UNITS tablet Take 2 tablets (2,000 Units total) by mouth daily. Reported on 07/07/2015 180 tablet 2  . medroxyPROGESTERone (DEPO-PROVERA) 150 MG/ML injection Inject 1 mL (150 mg total) into the muscle every 3 (three) months. 1 mL   . hydroxychloroquine (PLAQUENIL) 200 MG tablet Take 1 tablet (200 mg total) by mouth daily. 90 tablet 2  . lisinopril (PRINIVIL,ZESTRIL) 5 MG tablet Take 1 tablet (5 mg total) by mouth daily. Reported on 07/07/2015 30 tablet 3  . mycophenolate (CELLCEPT) 500 MG tablet Take 2 tablets (1,000 mg total) by mouth 2 (two) times daily. 180 tablet 2  . omeprazole (PRILOSEC) 40 MG capsule Take 1 capsule (40 mg total) by mouth daily. 90 capsule 2  . predniSONE (DELTASONE) 20 MG tablet Take 1 tablet (20 mg total) by mouth daily with breakfast. 30 tablet 2  . sulfamethoxazole-trimethoprim (BACTRIM DS,SEPTRA DS) 800-160 MG tablet Take 1 tablet by mouth 2 (two) times daily.     No facility-administered medications prior to visit.    ROS Review of Systems    Constitutional: Negative for fever and chills.  HENT: Positive for voice change. Negative for sore throat.   Eyes: Negative for visual disturbance.  Respiratory: Negative for shortness of breath.   Cardiovascular: Negative for chest pain.  Gastrointestinal: Negative for abdominal pain and blood in stool.  Genitourinary: Negative for hematuria.  Musculoskeletal: Negative for back pain and arthralgias.  Skin: Negative for rash.  Allergic/Immunologic: Negative for immunocompromised state.  Neurological: Negative for dizziness and light-headedness.  Hematological: Negative for adenopathy. Does not bruise/bleed easily.  Psychiatric/Behavioral: Negative for suicidal ideas and dysphoric mood.    Objective:  BP 98/65 mmHg  Pulse 87  Temp(Src) 98.5 F (36.9 C) (Oral)  Resp 16  Ht  (1.549 m)  Wt 114 lb (51.71 kg)  BMI 21.55 kg/m2  SpO2 98%  BP/Weight 12/08/2015 09/18/2015 07/31/2015  Systolic BP 98 86 107  Diastolic BP 65 54 60  Wt. (Lbs) 114 116 117.3  BMI 21.55 21.93 22.91    Physical Exam  Constitutional: She appears well-developed and well-nourished. No distress.  Cardiovascular: Normal rate, regular rhythm, normal heart sounds and intact distal pulses.   Pulmonary/Chest: Effort normal and breath sounds normal.  Genitourinary: Vagina normal and uterus normal. Pelvic exam was performed with patient prone. There is no rash, tenderness or lesion on the right labia. There is no rash, tenderness or lesion on the left labia. Cervix exhibits no motion tenderness, no discharge and no friability.  Musculoskeletal: She exhibits no  edema.  Lymphadenopathy:       Right: No inguinal adenopathy present.       Left: No inguinal adenopathy present.  Skin: Skin is warm and dry. No rash noted.     Assessment & Plan:   There are no diagnoses linked to this encounter. Wendy Kaufman was seen today for gynecologic exam.  Diagnoses and all orders for this visit:  Papanicolaou smear for cervical  cancer screening -     Cytology - PAP  Lupus vasculitis (HCC) -     predniSONE (DELTASONE) 20 MG tablet; Take 1 tablet (20 mg total) by mouth daily with breakfast. -     omeprazole (PRILOSEC) 40 MG capsule; Take 1 capsule (40 mg total) by mouth daily. -     hydroxychloroquine (PLAQUENIL) 200 MG tablet; Take 1 tablet (200 mg total) by mouth daily. -     mycophenolate (CELLCEPT) 500 MG tablet; Take 2 tablets (1,000 mg total) by mouth 2 (two) times daily. -     lisinopril (PRINIVIL,ZESTRIL) 5 MG tablet; Take 1 tablet (5 mg total) by mouth daily. Reported on 07/07/2015 -     sulfamethoxazole-trimethoprim (BACTRIM DS,SEPTRA DS) 800-160 MG tablet; Take 1 tablet by mouth 2 (two) times daily.  Proteinuria -     lisinopril (PRINIVIL,ZESTRIL) 5 MG tablet; Take 1 tablet (5 mg total) by mouth daily. Reported on 07/07/2015  Hoarseness of voice   Meds ordered this encounter  Medications  . predniSONE (DELTASONE) 20 MG tablet    Sig: Take 1 tablet (20 mg total) by mouth daily with breakfast.    Dispense:  90 tablet    Refill:  3    Spanish instructions please  . omeprazole (PRILOSEC) 40 MG capsule    Sig: Take 1 capsule (40 mg total) by mouth daily.    Dispense:  90 capsule    Refill:  3  . hydroxychloroquine (PLAQUENIL) 200 MG tablet    Sig: Take 1 tablet (200 mg total) by mouth daily.    Dispense:  90 tablet    Refill:  3    Spanish  . mycophenolate (CELLCEPT) 500 MG tablet    Sig: Take 2 tablets (1,000 mg total) by mouth 2 (two) times daily.    Dispense:  360 tablet    Refill:  3  . lisinopril (PRINIVIL,ZESTRIL) 5 MG tablet    Sig: Take 1 tablet (5 mg total) by mouth daily. Reported on 07/07/2015    Dispense:  90 tablet    Refill:  3    Spanish language instructions please  . sulfamethoxazole-trimethoprim (BACTRIM DS,SEPTRA DS) 800-160 MG tablet    Sig: Take 1 tablet by mouth 2 (two) times daily.    Dispense:  180 tablet    Refill:  3    Follow-up: Return in about 3 months (around  03/09/2016) for lupus .   Dessa PhiJosalyn Malyah Ohlrich MD

## 2015-12-08 NOTE — Progress Notes (Signed)
Pap smear No vaginal discharge  No sexually active  No pain today  No suicidal thoughts in the past two weeks

## 2015-12-08 NOTE — Telephone Encounter (Signed)
Rheumatology appointment  04/25/2016 at 11:30 AM [address] [place], [place] [address] [phone]   Please come to clinic to arrange [facility]

## 2015-12-10 MED ORDER — SULFAMETHOXAZOLE-TRIMETHOPRIM 800-160 MG PO TABS: 1.0000 | ORAL_TABLET | ORAL | Status: AC

## 2015-12-10 MED ORDER — BIOTIN 1000 MCG PO TABS: 1000.0000 ug | ORAL_TABLET | Freq: Every day | ORAL | Status: AC

## 2015-12-10 NOTE — Assessment & Plan Note (Signed)
A: patient with lupus with hx of pericarditis, nephritis, pleural effusions. No CP.  P: Continue prednisone 20 mg dialy Plaquenil 200 mg dialy Cellcept 500 mg, 1000 mg BID Lisinopril 5 mg daily Bactrim DS 1 tab every Monday, Wednesday, Friday for PJP prophylaxis   Contraception: depo

## 2015-12-10 NOTE — Telephone Encounter (Signed)
Please call patient with rheumatology appointment Patient ist take Bactrim 1 tab every Monday, Wednesday and Friday. Correct instructions sent to pharmacy  Please ask patient to come in for BMP with GFR and CBC.

## 2015-12-11 LAB — CERVICOVAGINAL ANCILLARY ONLY
Chlamydia: NEGATIVE
NEISSERIA GONORRHEA: NEGATIVE

## 2015-12-11 LAB — CYTOLOGY - PAP

## 2015-12-11 NOTE — Telephone Encounter (Signed)
LVM to return call.

## 2015-12-11 NOTE — Telephone Encounter (Signed)
Patient returned call to nurse

## 2015-12-12 LAB — CERVICOVAGINAL ANCILLARY ONLY: WET PREP (BD AFFIRM): NEGATIVE

## 2015-12-19 NOTE — Telephone Encounter (Signed)
-----   Message from Dessa PhiJosalyn Funches, MD sent at 12/11/2015  5:06 PM EDT ----- Negative pap Negative sreenning GC/chlam Be sure to come in for potassium check Be sure to take bactrim only one tab every Monday, Wednesday and Friday

## 2015-12-19 NOTE — Telephone Encounter (Signed)
LVM to return call.

## 2015-12-19 NOTE — Telephone Encounter (Signed)
-----   Message from Dessa PhiJosalyn Funches, MD sent at 12/12/2015 12:10 PM EDT ----- Negative wet prep

## 2015-12-19 NOTE — Telephone Encounter (Signed)
Pt. Returned call. Please f/u with pt. °

## 2015-12-20 NOTE — Telephone Encounter (Signed)
LVM to return call x3 

## 2015-12-25 ENCOUNTER — Ambulatory Visit: Payer: Self-pay | Attending: Family Medicine | Admitting: *Deleted

## 2015-12-25 DIAGNOSIS — Z309 Encounter for contraceptive management, unspecified: Secondary | ICD-10-CM

## 2015-12-25 DIAGNOSIS — D509 Iron deficiency anemia, unspecified: Secondary | ICD-10-CM | POA: Insufficient documentation

## 2015-12-25 DIAGNOSIS — R809 Proteinuria, unspecified: Secondary | ICD-10-CM | POA: Insufficient documentation

## 2015-12-25 DIAGNOSIS — M329 Systemic lupus erythematosus, unspecified: Secondary | ICD-10-CM | POA: Insufficient documentation

## 2015-12-25 DIAGNOSIS — M3219 Other organ or system involvement in systemic lupus erythematosus: Secondary | ICD-10-CM

## 2015-12-25 LAB — CBC
HEMATOCRIT: 42.3 % (ref 35.0–45.0)
Hemoglobin: 14.3 g/dL (ref 11.7–15.5)
MCH: 30.1 pg (ref 27.0–33.0)
MCHC: 33.8 g/dL (ref 32.0–36.0)
MCV: 89.1 fL (ref 80.0–100.0)
MPV: 9.6 fL (ref 7.5–12.5)
Platelets: 557 10*3/uL — ABNORMAL HIGH (ref 140–400)
RBC: 4.75 MIL/uL (ref 3.80–5.10)
RDW: 15 % (ref 11.0–15.0)
WBC: 10.9 10*3/uL — ABNORMAL HIGH (ref 3.8–10.8)

## 2015-12-25 LAB — BASIC METABOLIC PANEL WITH GFR
BUN: 12 mg/dL (ref 7–25)
CHLORIDE: 105 mmol/L (ref 98–110)
CO2: 23 mmol/L (ref 20–31)
CREATININE: 0.68 mg/dL (ref 0.50–1.10)
Calcium: 9.2 mg/dL (ref 8.6–10.2)
GFR, Est African American: >89 mL/min (ref >=60)
GFR, Est Non African American: >89 mL/min (ref >=60)
Glucose, Bld: 76 mg/dL (ref 65–99)
Potassium: 4.2 mmol/L (ref 3.5–5.3)
SODIUM: 138 mmol/L (ref 135–146)

## 2015-12-25 MED ORDER — MEDROXYPROGESTERONE ACETATE 150 MG/ML IM SUSP
150.0000 mg | Freq: Once | INTRAMUSCULAR | Status: AC
  Administered 2015-12-25: 150 mg via INTRAMUSCULAR

## 2015-12-25 MED FILL — predniSONE 20 MG TABS: 20 | 30 days supply | Qty: 30 | Fill #0

## 2015-12-25 NOTE — Patient Instructions (Signed)
REGRESA ENTRE AGO 21 -SEP 4 Return between [place] 21-Mar 25 2016

## 2015-12-25 NOTE — Progress Notes (Signed)
Pt here for Depo Inj and blood work  Last depo on 09/25/2015  Depo given today 12/25/2015

## 2015-12-27 ENCOUNTER — Telehealth: Payer: Self-pay | Admitting: *Deleted

## 2015-12-27 ENCOUNTER — Ambulatory Visit (HOSPITAL_COMMUNITY)
Admission: RE | Admit: 2015-12-27 | Discharge: 2015-12-27 | Disposition: A | Discharge status: Home / Self Care | Payer: Self-pay | Source: Ambulatory Visit | Attending: Family Medicine | Admitting: Family Medicine

## 2015-12-27 DIAGNOSIS — R079 Chest pain, unspecified: Secondary | ICD-10-CM | POA: Insufficient documentation

## 2015-12-27 MED FILL — SULFAMETHOXAZOLE-TMP DS TAB: 800-160 | 28 days supply | Qty: 12 | Fill #0

## 2015-12-27 NOTE — Telephone Encounter (Signed)
LVM to return call.

## 2015-12-27 NOTE — Telephone Encounter (Signed)
Patient returned nurses phone call. ° °

## 2015-12-27 NOTE — Telephone Encounter (Signed)
-----   Message from Dessa PhiJosalyn Funches, MD sent at 12/26/2015  9:02 AM EDT ----- Normal BMP  Normal potassiym

## 2015-12-28 NOTE — Telephone Encounter (Signed)
LVM to return call x2  

## 2015-12-29 NOTE — Telephone Encounter (Signed)
Date of birth verified by pt  Xray and lab results given  Pt verbalized understanding  Information given in Spanish

## 2015-12-29 NOTE — Telephone Encounter (Signed)
-----   Message from Dessa PhiJosalyn Funches, MD sent at 12/28/2015  9:06 AM EDT ----- Normal chest -xray

## 2015-12-29 NOTE — Telephone Encounter (Signed)
LVM to return call X3

## 2016-01-25 MED FILL — predniSONE 20 MG TABS: 20 | 30 days supply | Qty: 30 | Fill #1

## 2016-01-25 MED FILL — SULFAMETHOXAZOLE-TMP DS TAB: 800-160 | 28 days supply | Qty: 12 | Fill #1

## 2016-02-26 MED FILL — predniSONE 20 MG TABS: 20 | 30 days supply | Qty: 30 | Fill #2

## 2016-03-11 ENCOUNTER — Ambulatory Visit: Payer: Self-pay

## 2016-03-11 ENCOUNTER — Encounter: Payer: Self-pay | Admitting: Family Medicine

## 2016-03-11 ENCOUNTER — Ambulatory Visit: Payer: Self-pay | Attending: Family Medicine | Admitting: Family Medicine

## 2016-03-11 VITALS — BP 105/73 | HR 83 | Temp 97.9°F | Ht 61.0 in | Wt 118.6 lb

## 2016-03-11 DIAGNOSIS — R809 Proteinuria, unspecified: Secondary | ICD-10-CM | POA: Insufficient documentation

## 2016-03-11 DIAGNOSIS — M328 Other forms of systemic lupus erythematosus: Secondary | ICD-10-CM | POA: Insufficient documentation

## 2016-03-11 DIAGNOSIS — I7789 Other specified disorders of arteries and arterioles: Secondary | ICD-10-CM

## 2016-03-11 DIAGNOSIS — Z3042 Encounter for surveillance of injectable contraceptive: Secondary | ICD-10-CM

## 2016-03-11 DIAGNOSIS — Z79899 Other long term (current) drug therapy: Secondary | ICD-10-CM | POA: Insufficient documentation

## 2016-03-11 DIAGNOSIS — M3214 Glomerular disease in systemic lupus erythematosus: Secondary | ICD-10-CM

## 2016-03-11 DIAGNOSIS — Z0000 Encounter for general adult medical examination without abnormal findings: Secondary | ICD-10-CM

## 2016-03-11 DIAGNOSIS — M329 Systemic lupus erythematosus, unspecified: Secondary | ICD-10-CM

## 2016-03-11 LAB — POCT URINE PREGNANCY: Preg Test, Ur: NEGATIVE

## 2016-03-11 LAB — POCT URINALYSIS DIPSTICK
Bilirubin, UA: NEGATIVE
GLUCOSE UA: NEGATIVE
Ketones, UA: NEGATIVE
NITRITE UA: NEGATIVE
Spec Grav, UA: 1.025
UROBILINOGEN UA: 0.2
pH, UA: 6

## 2016-03-11 LAB — BASIC METABOLIC PANEL WITH GFR
BUN: 11 mg/dL (ref 7–25)
CALCIUM: 9.3 mg/dL (ref 8.6–10.2)
CO2: 22 mmol/L (ref 20–31)
Chloride: 106 mmol/L (ref 98–110)
Creat: 0.68 mg/dL (ref 0.50–1.10)
Glucose, Bld: 79 mg/dL (ref 65–99)
Potassium: 4.3 mmol/L (ref 3.5–5.3)
SODIUM: 139 mmol/L (ref 135–146)

## 2016-03-11 MED ORDER — MEDROXYPROGESTERONE ACETATE 150 MG/ML IM SUSP
150.0000 mg | Freq: Once | INTRAMUSCULAR | Status: AC
  Administered 2016-03-11: 150 mg via INTRAMUSCULAR

## 2016-03-11 MED ORDER — LISINOPRIL 5 MG PO TABS: 5.0000 mg | ORAL_TABLET | Freq: Every day | ORAL | 3 refills | Status: AC

## 2016-03-11 MED ORDER — OMEPRAZOLE 40 MG PO CPDR: 40.0000 mg | DELAYED_RELEASE_CAPSULE | Freq: Every day | ORAL | 3 refills | Status: AC

## 2016-03-11 MED ORDER — MYCOPHENOLATE MOFETIL 500 MG PO TABS: 1000.0000 mg | ORAL_TABLET | Freq: Two times a day (BID) | ORAL | 3 refills | Status: AC

## 2016-03-11 MED FILL — ?LISINOPRIL 5 MG TABLET: 5 | 30 days supply | Qty: 30 | Fill #0

## 2016-03-11 MED FILL — OMEPRAZOLE DR 40 MG CAPSULE: 40 | 30 days supply | Qty: 30 | Fill #0

## 2016-03-11 NOTE — Patient Instructions (Addendum)
Wendy HesselbachMaria was seen today for follow-up and lupus.  Diagnoses and all orders for this visit:  Healthcare maintenance -     Flu Vaccine QUAD 36+ mos IM  On Depo-Provera for contraception -     POCT urine pregnancy  Lupus nephritis: Probable/ rule out -     POCT urinalysis dipstick -     Microalbumin/Creatinine Ratio, Urine  Lupus vasculitis (HCC) -     omeprazole (PRILOSEC) 40 MG capsule; Take 1 capsule (40 mg total) by mouth daily. -     mycophenolate (CELLCEPT) 500 MG tablet; Take 2 tablets (1,000 mg total) by mouth 2 (two) times daily. -     lisinopril (PRINIVIL,ZESTRIL) 5 MG tablet; Take 1 tablet (5 mg total) by mouth daily. Reported on 07/07/2015  Proteinuria -     lisinopril (PRINIVIL,ZESTRIL) 5 MG tablet; Take 1 tablet (5 mg total) by mouth daily. Reported on 07/07/2015

## 2016-03-11 NOTE — Progress Notes (Signed)
Subjective:  Patient ID: Wendy Kaufman, female    DOB: [birth year]  Age: 30 y.o. MRN: [id number]  CC: Follow-up (depo shot) and Lupus   HPI Wendy Kaufman presents for   1.  Lupus vasculitis with proteinuria: she has hx of with hx of pericarditis, nephritis, pleural effusions. She  compliant with all meds except cellcept and lisinopril. She is non compliant with renal and rheumatology follow [facility] despite multiple scheduled appointments and arranging transportation. She last saw her rheumatologist in 03/2015. Last saw nephrology in 06/2015.   Today she reports,  hoarseness has worsened since last office visit. Still losing hair. No weight change. No swelling.   Social History  Substance Use Topics  . Smoking status: Never Smoker  . Smokeless tobacco: Never Used  . Alcohol use 1.2 - 1.8 oz/week    2 - 3 Cans of beer per week    Outpatient Medications Prior to Visit  Medication Sig Dispense Refill  . Biotin 1000 MCG tablet Take 1 tablet (1 mg total) by mouth daily. 90 tablet 3  . calcium carbonate (OS-CAL - DOSED IN MG OF ELEMENTAL CALCIUM) 1250 (500 CA) MG tablet Take 1 tablet (1,250 mg total) by mouth daily with breakfast. Reported on 07/07/2015 90 tablet 2  . Cholecalciferol (VITAMIN D-1000 MAX ST) 1000 UNITS tablet Take 2 tablets (2,000 Units total) by mouth daily. Reported on 07/07/2015 180 tablet 2  . hydroxychloroquine (PLAQUENIL) 200 MG tablet Take 1 tablet (200 mg total) by mouth daily. 90 tablet 3  . medroxyPROGESTERone (DEPO-PROVERA) 150 MG/ML injection Inject 1 mL (150 mg total) into the muscle every 3 (three) months. 1 mL   . predniSONE (DELTASONE) 20 MG tablet Take 1 tablet (20 mg total) by mouth daily with breakfast. 90 tablet 3  . sulfamethoxazole-trimethoprim (BACTRIM DS,SEPTRA DS) 800-160 MG tablet Take 1 tablet by mouth 3 (three) times a week. Every Monday, Wednesday and Friday 45 tablet 3  . lisinopril (PRINIVIL,ZESTRIL) 5 MG tablet Take 1  tablet (5 mg total) by mouth daily. Reported on 07/07/2015 (Patient not taking: Reported on 03/11/2016) 90 tablet 3  . mycophenolate (CELLCEPT) 500 MG tablet Take 2 tablets (1,000 mg total) by mouth 2 (two) times daily. (Patient not taking: Reported on 03/11/2016) 360 tablet 3  . omeprazole (PRILOSEC) 40 MG capsule Take 1 capsule (40 mg total) by mouth daily. (Patient not taking: Reported on 03/11/2016) 90 capsule 3   No facility-administered medications prior to visit.     ROS Review of Systems  Constitutional: Negative for chills and fever.  HENT: Positive for voice change. Negative for sore throat.   Eyes: Negative for visual disturbance.  Respiratory: Negative for shortness of breath.   Cardiovascular: Negative for chest pain.  Gastrointestinal: Negative for abdominal pain and blood in stool.  Genitourinary: Negative for hematuria.  Musculoskeletal: Negative for arthralgias and back pain.  Skin: Negative for rash.  Allergic/Immunologic: Negative for immunocompromised state.  Neurological: Negative for dizziness and light-headedness.  Hematological: Negative for adenopathy. Does not bruise/bleed easily.  Psychiatric/Behavioral: Negative for dysphoric mood and suicidal ideas.    Objective:  BP 105/73 (BP Location: Left Arm, Patient Position: Sitting, Cuff Size: Small)   Pulse 83   Temp 97.9 F (36.6 C) (Oral)   Ht 5\' 1"  (1.549 m)   Wt 118 lb 9.6 oz (53.8 kg)   SpO2 94%   BMI 22.41 kg/m   BP/Weight 03/11/2016 12/08/2015 09/18/2015  Systolic BP 105 98 86  Diastolic BP 73  65 54  Wt. (Lbs) 118.6 114 116  BMI 22.41 21.55 21.93    Physical Exam  Constitutional: She appears well-developed and well-nourished. No distress.  HENT:  Head: Normocephalic and atraumatic.  Mouth/Throat: Oropharynx is clear and moist. No oropharyngeal exudate.  Neck: Normal range of motion. Neck supple.  Cardiovascular: Normal rate, regular rhythm, normal heart sounds and intact distal pulses.  Exam  reveals no gallop and no friction rub.   No murmur heard. Pulmonary/Chest: Effort normal and breath sounds normal.  Musculoskeletal: She exhibits no edema.  Skin: Skin is warm and dry. No rash noted.   UA: > 300 protein, moderate RBC  Depo and flu shot administered today   Assessment & Plan:   There are no diagnoses linked to this encounter. Byrd HesselbachMaria was seen today for follow-up and lupus.  Diagnoses and all orders for this visit:  On Depo-Provera for contraception -     POCT urine pregnancy -     medroxyPROGESTERone (DEPO-PROVERA) injection 150 mg; Inject 1 mL (150 mg total) into the muscle once.  Healthcare maintenance -     Flu Vaccine QUAD 36+ mos IM  Lupus nephritis: Probable/ rule out -     POCT urinalysis dipstick -     Microalbumin/Creatinine Ratio, Urine -     Ambulatory referral to Social Work -     BASIC METABOLIC PANEL WITH GFR  Lupus vasculitis (HCC) -     omeprazole (PRILOSEC) 40 MG capsule; Take 1 capsule (40 mg total) by mouth daily. -     mycophenolate (CELLCEPT) 500 MG tablet; Take 2 tablets (1,000 mg total) by mouth 2 (two) times daily. -     lisinopril (PRINIVIL,ZESTRIL) 5 MG tablet; Take 1 tablet (5 mg total) by mouth daily. Reported on 07/07/2015 -     Ambulatory referral to Social Work -     BASIC METABOLIC PANEL WITH GFR  Proteinuria -     lisinopril (PRINIVIL,ZESTRIL) 5 MG tablet; Take 1 tablet (5 mg total) by mouth daily. Reported on 07/07/2015 -     BASIC METABOLIC PANEL WITH GFR   No orders of the defined types were placed in this encounter.   Follow-up: No Follow-up on file.   Dessa PhiJosalyn Michaell Grider MD

## 2016-03-11 NOTE — Assessment & Plan Note (Addendum)
Patient with lupus with worsening proteinuria on UA Continue current meds Restart cellcept, lisinopril and prilosec Referred to case management to help with schedule f/u appt with rheumatology and [facility] and to arrange transportation

## 2016-03-12 ENCOUNTER — Telehealth: Payer: Self-pay

## 2016-03-12 LAB — MICROALBUMIN / CREATININE URINE RATIO
CREATININE, URINE: 249 mg/dL (ref 20–320)
MICROALB UR: 31.2 mg/dL
Microalb Creat Ratio: 125 mcg/mg creat — ABNORMAL HIGH (ref <30)

## 2016-03-12 NOTE — Telephone Encounter (Signed)
Attempted to contact the patient to discuss scheduling appointments with her rheumatologist and nephrologist as well as transportation needs to get to the appointments. This CM to also discuss the need to apply for an [employer].  Call placed to # [phone] (M) with the assistance of Spanish Interpreter # [phone] with [employer]. A HIPAA compliant voicemail message was left noting that this CM will try to reach her again tomorrow.

## 2016-03-13 ENCOUNTER — Telehealth: Payer: Self-pay

## 2016-03-13 NOTE — Telephone Encounter (Signed)
Call received from the patient.  She spoke to United ParcelDeisy Kaufman, [facility] sched[facility] who speaks Spanish, informed the patient that the clinic is trying to schedule her appointments with rheumatology and nephrology at the request of Wendy Wendy Kaufman. Both doctors practice at [facility] in [place] and the patient does not have any transportation to the appointments  D. Kaufman explained to the patient that it is important to apply for the [facility] as she would possibly be eligible for transportation to the appointments. The patient agreed to meet with the Financial Counselor at [facility] to apply for the [facility] and an appointment was scheduled for 03/22/16 @ 1400.  D. Kaufman explained to the patient what information is needed to apply for the [employer] Card.     This CM reviewed the [facility]'s [facility] and it was determined that the patient saw Wendy Kaufman ( rheumatolgy) # [phone]  in 06/2015 and has an appointment scheduled for 04/25/16.  The patient has last saw Wendy Kaufman ( nephrology) # [phone] in 2016 and there is no follow up appointment noted.    Wendy Botelllo, [facility] Scheduler called the patient again and confirmed the names of the nephrologist and rheumatologist.  The patient was also agreeable to having both appointments scheduled on the same day if possible and said that her son would be accompanying her to the appointment.    Call placed to the [facility] office # [phone] to schedule and appointment for the patient and spoke to SchulenburgJonetta.  Wendy Kaufman stated that Wendy Kaufman is not in the office any longer and the physician that is assigned to the patient is Wendy Kaufman and he doesn't have any available appointments until after 07/22/2016.  She said that she would need to  have the nephrology nurse call this CM to schedule an appointment. Call back # [phone]. Explained that the patient has an appointment scheduled in another clinic on 04/25/16 and if the  nephrology appointment can be scheduled for the same day that would work well.   Call placed to the rheumatolgy office # [phone]  to confirm the time of the appointment on 04/25/16.  Wendy Kaufman noted that the appointment is at 1140 and should last about an hour.  The clinic [place] is [address], [place], [place] [address]. She said that the nephrology [facility] and there is a shuttle available to take the patients between buildings.

## 2016-03-15 ENCOUNTER — Telehealth: Payer: Self-pay

## 2016-03-15 NOTE — Telephone Encounter (Signed)
Pt was called on 8/25, left voicemail for pt to return phone call.

## 2016-03-15 NOTE — Telephone Encounter (Signed)
This Case Manager received phone call from TaholahAngie, RN with [facility]. She indicated Dr. Sherryll BurgerShah is patient's assigned Nephrologist. She is aware patient has another appointment on 04/25/16 with Rheumatologist but indicated Dr. Sherryll BurgerShah will not be in clinic that day. She also indicated that provider does not have any available appointments until after 07/22/16.  Appointment scheduled for 08/06/16 at 1005 with Dr. Sherryll BurgerShah. She indicated she would put patient on a list in case provider has cancellations or additional appointments become available. This Case Manager placed call to patient to provide update and to inform her Nephrology and Rheumatology appointments. Call placed with aide of [employer] (Interpreter # [phone]). Unable to reach patient; voicemail left indicating clinic will try to reach her again at a later time.

## 2016-03-18 ENCOUNTER — Telehealth: Payer: Self-pay

## 2016-03-18 NOTE — Telephone Encounter (Signed)
Attempted to contact the patient to notify her of her appointment with Dr Sherryll BurgerShah, nephrologist, scheduled for 08/06/16 @1005 . Call placed to # [phone] (H) with the assistance of Spanish interpreter # [phone] with [employer]. A HIPAA compliant voicemail message was left informing the patient that a CM would call back again at another time.

## 2016-03-18 NOTE — Telephone Encounter (Signed)
Pt results will be mailed out on 8/28.

## 2016-03-19 ENCOUNTER — Telehealth: Payer: Self-pay

## 2016-03-19 NOTE — Telephone Encounter (Signed)
Attempted to contact the patient to inform her of her appointment with Dr Sherryll BurgerShah( nephrology) on 08/06/16 @ 1005 at [facility] and to remind her of her financial counseling appointment at the [facility] on 03/22/16 @ 1400.  Call placed to #  [phone] (H) with the assistance of Spanish Trixie Deisnterpreter , Kajsa # [phone] with [employer] and a HIPAA compliant voicemail message was left informing the patient that another call would be made to her at a later time.

## 2016-03-20 ENCOUNTER — Telehealth: Payer: Self-pay

## 2016-03-20 NOTE — Telephone Encounter (Signed)
This Case Manager placed call to patient with aide of [employer] (Spanish Interpreter [phone]) to inform patient of her appointment with Dr. Sherryll BurgerShah on 08/06/16 at 1005 at [facility], to remind her of Rheumatology appointment on 04/25/16 at 1140 with Dr. Fabio AsaFrancis Luk, and to remind patient of appointment on 03/22/16 at 1400 with [facility]. Unable to reach patient; voicemail left requesting patient return call to this Case Manager when able.

## 2016-03-22 ENCOUNTER — Ambulatory Visit: Payer: Self-pay

## 2016-03-26 ENCOUNTER — Telehealth: Payer: Self-pay | Admitting: Family Medicine

## 2016-03-26 DIAGNOSIS — M329 Systemic lupus erythematosus, unspecified: Principal | ICD-10-CM

## 2016-03-26 DIAGNOSIS — I7789 Other specified disorders of arteries and arterioles: Secondary | ICD-10-CM

## 2016-03-26 NOTE — Telephone Encounter (Signed)
Patient is requesting a medication refill for Prednisone...please follow up

## 2016-03-28 MED FILL — ?PREDNISONE 20 MG TABLET: 20 | 30 days supply | Qty: 30 | Fill #3

## 2016-03-28 NOTE — Telephone Encounter (Signed)
Will route to PCP 

## 2016-03-29 MED ORDER — PREDNISONE 20 MG PO TABS: 20.0000 mg | ORAL_TABLET | Freq: Every day | ORAL | 3 refills | Status: AC

## 2016-03-29 NOTE — Telephone Encounter (Signed)
Prednisone refilled Please inform patient

## 2016-04-03 ENCOUNTER — Telehealth: Payer: Self-pay

## 2016-04-03 NOTE — Telephone Encounter (Signed)
Interpreter diego [phone] called and left VM and informed pt that script was sent over to pharmacy.

## 2016-04-03 NOTE — Telephone Encounter (Signed)
Attempted to contact the patient to inform her of her appointment dates/times at [facility].  She has an appointment with the rheumatologist - Dr Nydia BoutonLuk on 04/25/16 @ 1140 and an appointment with the nephrologist  On 08/06/16 @ 1005. This CM also wanted to discuss plans for transportation to the appointments and rescheduling an appointment with [facility]. Call placed to # [phone]  with the assistance Spanish Interpreter, Kathleen Argueearla # [phone] with [facility] [employer].  A HIPAA compliant voicemail message was left informing the patient that the CM would call back at a later time.

## 2016-04-04 ENCOUNTER — Telehealth: Payer: Self-pay

## 2016-04-04 NOTE — Telephone Encounter (Signed)
This Case Manager placed an additional call with aide of [employer] (Interpreter [phone]) to patient to inform her of her [facility] appointments (Appointment on 04/25/16 at 1140 with Dr. Rolland BimlerLuc-Rheumatology, and appointment on 08/06/16 at 1005 with Dr. Sherryll BurgerShah, Nephrology). Unable to reach patient; voicemail left requesting patient return call to this Case Manager when able.

## 2016-04-15 ENCOUNTER — Ambulatory Visit: Payer: Self-pay

## 2016-04-22 ENCOUNTER — Telehealth: Payer: Self-pay

## 2016-04-22 NOTE — Telephone Encounter (Signed)
As per Horald Chestnutarmen Ramirez, [facility] Team Lead, she has spoken to the patient about transportation to her rheumatology appointment with Dr Nydia [facility]outonLuk at [facility] on 04/25/16 @ 1140 and the patient does not have transportation to the clinic.As per C. Bethena Roysamirez, Jamilla Pinder, [facility] Practice Manager has approved cab transportation to the appointment and Idelle Leechhadelle Brown, [facility] scheduler was notified of the need for the appointment.  The clinic address is [address], [place], [place] [address].

## 2016-04-23 ENCOUNTER — Telehealth: Payer: Self-pay

## 2016-04-23 NOTE — Telephone Encounter (Signed)
Call placed to [facility] Rhe[facility] office, spoke to Amy and confirmed the patient's appointment on 04/25/16 @ 1140. Also confirmed address - [address], [place], [place] [address]

## 2016-04-24 ENCOUNTER — Ambulatory Visit: Payer: Self-pay | Attending: Family Medicine

## 2016-04-24 ENCOUNTER — Telehealth: Payer: Self-pay

## 2016-04-24 NOTE — Telephone Encounter (Signed)
As per Idelle Leechhadelle Brown, [facility] scheduler, transportation has been arranged to take  the patient  to/from [facility] tomorrow.

## 2016-04-25 MED FILL — ?HYDROXYCHLOROQUINE 200 MG: 200 | 30 days supply | Qty: 30 | Fill #0

## 2016-04-26 MED FILL — ?PREDNISONE 10 MG TABLET: 10 | 30 days supply | Qty: 45 | Fill #0

## 2016-05-27 MED FILL — ?PREDNISONE 20 MG TABLET: 20 | 30 days supply | Qty: 30 | Fill #4

## 2016-05-29 MED FILL — ?HYDROXYCHLOROQUINE 200 MG: 200 | 30 days supply | Qty: 30 | Fill #1

## 2016-06-21 ENCOUNTER — Encounter: Payer: Self-pay | Admitting: Family Medicine

## 2016-06-21 ENCOUNTER — Ambulatory Visit: Payer: Self-pay | Attending: Family Medicine | Admitting: Family Medicine

## 2016-06-21 ENCOUNTER — Other Ambulatory Visit: Payer: Self-pay

## 2016-06-21 VITALS — BP 101/69 | HR 65 | Temp 97.9°F | Ht 61.0 in | Wt 119.6 lb

## 2016-06-21 DIAGNOSIS — I776 Arteritis, unspecified: Secondary | ICD-10-CM | POA: Insufficient documentation

## 2016-06-21 DIAGNOSIS — Z3042 Encounter for surveillance of injectable contraceptive: Secondary | ICD-10-CM

## 2016-06-21 DIAGNOSIS — Z79899 Other long term (current) drug therapy: Secondary | ICD-10-CM | POA: Insufficient documentation

## 2016-06-21 DIAGNOSIS — Z3040 Encounter for surveillance of contraceptives, unspecified: Secondary | ICD-10-CM

## 2016-06-21 DIAGNOSIS — R079 Chest pain, unspecified: Secondary | ICD-10-CM

## 2016-06-21 DIAGNOSIS — M329 Systemic lupus erythematosus, unspecified: Secondary | ICD-10-CM | POA: Insufficient documentation

## 2016-06-21 LAB — CBC
HEMATOCRIT: 45.6 % — AB (ref 35.0–45.0)
HEMOGLOBIN: 15.5 g/dL (ref 11.7–15.5)
MCH: 31.9 pg (ref 27.0–33.0)
MCHC: 34 g/dL (ref 32.0–36.0)
MCV: 93.8 fL (ref 80.0–100.0)
MPV: 9.7 fL (ref 7.5–12.5)
Platelets: 458 10*3/uL — ABNORMAL HIGH (ref 140–400)
RBC: 4.86 MIL/uL (ref 3.80–5.10)
RDW: 13.9 % (ref 11.0–15.0)
WBC: 13.7 10*3/uL — AB (ref 3.8–10.8)

## 2016-06-21 LAB — POCT URINE PREGNANCY: Preg Test, Ur: NEGATIVE

## 2016-06-21 MED ORDER — ASPIRIN EC 81 MG PO TBEC: 81.0000 mg | DELAYED_RELEASE_TABLET | Freq: Every day | ORAL | 2 refills | Status: AC

## 2016-06-21 MED ORDER — MEDROXYPROGESTERONE ACETATE 150 MG/ML IM SUSP
150.0000 mg | Freq: Once | INTRAMUSCULAR | Status: AC
  Administered 2016-06-21: 150 mg via INTRAMUSCULAR

## 2016-06-21 NOTE — Assessment & Plan Note (Signed)
Chest pains in patient with lupus EKG findings concerning  For ischemia, no ST elevation to suggest pericarditis  Plan: Referral to cardiology Daily ASA ordered Increase prednisone from 20 to 50 mg daily for 5 days, then back to 20 mg daily CXR BNP CBC Closer f/u in 10 days with plan to taper prednisone per rheumatology instructions if pain improves Reviewed signs and symptoms to prompt patient to go to ED

## 2016-06-21 NOTE — Progress Notes (Signed)
Pt is here today to follow up on lupus. Pt  Is having chest pains, pt states when she breaths is hurts her chest and back.

## 2016-06-21 NOTE — Patient Instructions (Addendum)
Byrd HesselbachMaria was seen today for lupus.  Diagnoses and all orders for this visit:  Chest pain, unspecified type -     DG Chest 2 View; Future -     Brain natriuretic peptide -     CBC -     EKG 12-Lead -     Ambulatory referral to Cardiology  On Depo-Provera for contraception -     POCT urine pregnancy -     medroxyPROGESTERone (DEPO-PROVERA) injection 150 mg; Inject 1 mL (150 mg total) into the muscle once.   Increase prednisone to 50 mg daily for next 5 days Take 2 and 1/2 pills with food Then back to 20 mg daily   Please complete chest x-ray  Start daily aspirin  I have referred you back to cardiology for stress testing to evaluate coronary arteries   Go to ED if pain in chest or shortness of breath   F/u in 7-10 days for chest pain   Dr. Armen PickupFunches

## 2016-06-21 NOTE — Progress Notes (Signed)
Subjective:  Patient ID: Wendy Kaufman, female    DOB: Dec 04, 1985  Age: 30 y.o. MRN: [id number]  CC: Lupus   HPI Wendy Kaufman  Has lupus (complicated by nephritis, hx of pericardial effusion in 02/2014) she presents for   1.  Lupus vasculitis with proteinuria: she has hx of pericarditis, nephritis, pleural effusions. She last saw her rheumatologist in [facility], Dr. Nydia BoutonLuk. She was instructed to continue cellcept, plaquenil and prednisone tapering from 20 mg daily to 15 mg daily for one month, then 10 mg daily. She is currently taking 20 mg prednisone as she picked up a 20 mg refill from the onsite pharmacy.  She  last saw nephrology in 06/2015.   Today she reports, chest and back pain for past 2 days. She has  right and left  Upper anterior and lower lateral chest pain. Heaviness and pain when takes a deep breath. Pain does not radiate. This started 2 days ago. There is associated shortness of breath. No fever or chills. No cough. No sick contacts. No trauma. Her current pain level is 6/10.   Social History  Substance Use Topics  . Smoking status: Never Smoker  . Smokeless tobacco: Never Used  . Alcohol use 1.2 - 1.8 oz/week    2 - 3 Cans of beer per week    Outpatient Medications Prior to Visit  Medication Sig Dispense Refill  . Biotin 1000 MCG tablet Take 1 tablet (1 mg total) by mouth daily. 90 tablet 3  . hydroxychloroquine (PLAQUENIL) 200 MG tablet Take 1 tablet (200 mg total) by mouth daily. 90 tablet 3  . mycophenolate (CELLCEPT) 500 MG tablet Take 2 tablets (1,000 mg total) by mouth 2 (two) times daily. 360 tablet 3  . predniSONE (DELTASONE) 20 MG tablet Take 1 tablet (20 mg total) by mouth daily with breakfast. 90 tablet 3  . calcium carbonate (OS-CAL - DOSED IN MG OF ELEMENTAL CALCIUM) 1250 (500 CA) MG tablet Take 1 tablet (1,250 mg total) by mouth daily with breakfast. Reported on 07/07/2015 (Patient not taking: Reported on 06/21/2016) 90 tablet 2   . Cholecalciferol (VITAMIN D-1000 MAX ST) 1000 UNITS tablet Take 2 tablets (2,000 Units total) by mouth daily. Reported on 07/07/2015 (Patient not taking: Reported on 06/21/2016) 180 tablet 2  . lisinopril (PRINIVIL,ZESTRIL) 5 MG tablet Take 1 tablet (5 mg total) by mouth daily. Reported on 07/07/2015 (Patient not taking: Reported on 06/21/2016) 90 tablet 3  . medroxyPROGESTERone (DEPO-PROVERA) 150 MG/ML injection Inject 1 mL (150 mg total) into the muscle every 3 (three) months. (Patient not taking: Reported on 06/21/2016) 1 mL   . omeprazole (PRILOSEC) 40 MG capsule Take 1 capsule (40 mg total) by mouth daily. (Patient not taking: Reported on 06/21/2016) 90 capsule 3  . sulfamethoxazole-trimethoprim (BACTRIM DS,SEPTRA DS) 800-160 MG tablet Take 1 tablet by mouth 3 (three) times a week. Every Monday, Wednesday and Friday (Patient not taking: Reported on 06/21/2016) 45 tablet 3   No facility-administered medications prior to visit.     ROS Review of Systems  Constitutional: Negative for chills and fever.  HENT: Positive for voice change. Negative for sore throat.   Eyes: Negative for visual disturbance.  Respiratory: Negative for shortness of breath.   Cardiovascular: Negative for chest pain.  Gastrointestinal: Negative for abdominal pain and blood in stool.  Genitourinary: Negative for hematuria.  Musculoskeletal: Negative for arthralgias and back pain.  Skin: Negative for rash.  Allergic/Immunologic: Negative for immunocompromised state.  Neurological:  Negative for dizziness and light-headedness.  Hematological: Negative for adenopathy. Does not bruise/bleed easily.  Psychiatric/Behavioral: Negative for dysphoric mood and suicidal ideas.    Objective:  BP 101/69 (BP Location: Left Arm, Patient Position: Sitting, Cuff Size: Small)   Pulse 65   Temp 97.9 F (36.6 C) (Oral)   Ht 5\' 1"  (1.549 m)   Wt 119 lb 9.6 oz (54.3 kg)   SpO2 96%   BMI 22.60 kg/m   BP/Weight 06/21/2016 03/11/2016  12/08/2015  Systolic BP 101 105 98  Diastolic BP 69 73 65  Wt. (Lbs) 119.6 118.6 114  BMI 22.6 22.41 21.55    Physical Exam  Constitutional: She appears well-developed and well-nourished. No distress.  HENT:  Head: Normocephalic and atraumatic.  Mouth/Throat: Oropharynx is clear and moist. No oropharyngeal exudate.  Neck: Normal range of motion. Neck supple.  Cardiovascular: Normal rate, regular rhythm, normal heart sounds and intact distal pulses.  Exam reveals no gallop and no friction rub.   No murmur heard. Pulmonary/Chest: Effort normal and breath sounds normal.  Musculoskeletal: She exhibits no edema.  Skin: Skin is warm and dry. No rash noted.   U preg: negative  Depo  administered today   EKG: normal sinus rhythm, ST depression in anterior leads this is new when compared to EKG done on 03/24/2015. Assessment & Plan:   There are no diagnoses linked to this encounter. Wendy Kaufman was seen today for lupus.  Diagnoses and all orders for this visit:  Chest pain, unspecified type -     DG Chest 2 View; Future -     Brain natriuretic peptide -     CBC -     EKG 12-Lead -     Ambulatory referral to Cardiology -     aspirin EC 81 MG tablet; Take 1 tablet (81 mg total) by mouth daily.  On Depo-Provera for contraception -     POCT urine pregnancy -     medroxyPROGESTERone (DEPO-PROVERA) injection 150 mg; Inject 1 mL (150 mg total) into the muscle once.   No orders of the defined types were placed in this encounter.   Follow-up: Return in about 10 days (around 07/01/2016).   Dessa PhiJosalyn Christel Bai MD

## 2016-06-22 LAB — BRAIN NATRIURETIC PEPTIDE: Brain Natriuretic Peptide: 951.4 pg/mL — ABNORMAL HIGH (ref <100)

## 2016-06-25 ENCOUNTER — Ambulatory Visit (HOSPITAL_COMMUNITY)
Admission: RE | Admit: 2016-06-25 | Discharge: 2016-06-25 | Disposition: A | Discharge status: Home / Self Care | Payer: Self-pay | Attending: Family Medicine | Admitting: Family Medicine

## 2016-06-25 ENCOUNTER — Ambulatory Visit (HOSPITAL_COMMUNITY)
Admission: RE | Admit: 2016-06-25 | Discharge: 2016-06-25 | Disposition: A | Discharge status: Home / Self Care | Payer: Self-pay | Source: Ambulatory Visit | Attending: Family Medicine | Admitting: Family Medicine

## 2016-06-25 DIAGNOSIS — R079 Chest pain, unspecified: Secondary | ICD-10-CM | POA: Insufficient documentation

## 2016-06-25 MED FILL — predniSONE 20 MG TABS: 20 | 30 days supply | Qty: 30 | Fill #5

## 2016-06-26 ENCOUNTER — Telehealth: Payer: Self-pay

## 2016-06-26 ENCOUNTER — Telehealth: Payer: Self-pay | Admitting: Family Medicine

## 2016-06-26 NOTE — Telephone Encounter (Signed)
Patient called for lab results.

## 2016-06-26 NOTE — Telephone Encounter (Signed)
Pt was called and a VM was left informing pt to return call for lab results.  If pt returns phone call please inform her of:  Elevated BNP concerning enlarged heart  CBC with slightly elevated WBC  Referral to cardiology already placed  If she has worsening chest pain or shortness of breath she is advised to go to ED    No pneumonia or obvious enlargement of the heart on CXR

## 2016-07-05 ENCOUNTER — Telehealth: Payer: Self-pay

## 2016-07-05 NOTE — Telephone Encounter (Signed)
This Case Manager placed call to patient to remind her of her appointment on 08/06/16 at 1015 with Dr. Sherryll BurgerShah with [facility]. Call placed using [employer] (Interpreter # [phone]). Patient appreciative of reminder. Informed her that clinic unable to provide transportation to appointment but recommended patient contact friends and family to arrange transportation to appointment. [employer] bus may also be an option. Patient verbalized understanding. Provided [facility] address and phone number and information given to patient. Patient verbalized understanding. No additional needs/concerns identified.

## 2016-07-16 ENCOUNTER — Emergency Department (HOSPITAL_COMMUNITY): Payer: Self-pay

## 2016-07-16 ENCOUNTER — Inpatient Hospital Stay (HOSPITAL_COMMUNITY)
Admission: EM | Admit: 2016-07-16 | Discharge: 2016-07-22 | DRG: 690 | Disposition: E | Payer: Self-pay | Attending: Internal Medicine | Admitting: Internal Medicine

## 2016-07-16 ENCOUNTER — Encounter (HOSPITAL_COMMUNITY): Payer: Self-pay

## 2016-07-16 DIAGNOSIS — N10 Acute tubulo-interstitial nephritis: Principal | ICD-10-CM | POA: Diagnosis present

## 2016-07-16 DIAGNOSIS — I469 Cardiac arrest, cause unspecified: Secondary | ICD-10-CM | POA: Diagnosis present

## 2016-07-16 DIAGNOSIS — Z825 Family history of asthma and other chronic lower respiratory diseases: Secondary | ICD-10-CM

## 2016-07-16 DIAGNOSIS — M3214 Glomerular disease in systemic lupus erythematosus: Secondary | ICD-10-CM | POA: Diagnosis present

## 2016-07-16 DIAGNOSIS — R112 Nausea with vomiting, unspecified: Secondary | ICD-10-CM | POA: Diagnosis present

## 2016-07-16 DIAGNOSIS — I7300 Raynaud's syndrome without gangrene: Secondary | ICD-10-CM | POA: Diagnosis present

## 2016-07-16 DIAGNOSIS — R230 Cyanosis: Secondary | ICD-10-CM | POA: Diagnosis present

## 2016-07-16 DIAGNOSIS — Z7982 Long term (current) use of aspirin: Secondary | ICD-10-CM

## 2016-07-16 DIAGNOSIS — Z79899 Other long term (current) drug therapy: Secondary | ICD-10-CM

## 2016-07-16 DIAGNOSIS — IMO0002 Reserved for concepts with insufficient information to code with codable children: Secondary | ICD-10-CM

## 2016-07-16 DIAGNOSIS — Z7952 Long term (current) use of systemic steroids: Secondary | ICD-10-CM

## 2016-07-16 DIAGNOSIS — M329 Systemic lupus erythematosus, unspecified: Secondary | ICD-10-CM

## 2016-07-16 DIAGNOSIS — R079 Chest pain, unspecified: Secondary | ICD-10-CM

## 2016-07-16 DIAGNOSIS — N179 Acute kidney failure, unspecified: Secondary | ICD-10-CM

## 2016-07-16 DIAGNOSIS — E860 Dehydration: Secondary | ICD-10-CM

## 2016-07-16 LAB — LIPASE, BLOOD: Lipase: 53 U/L — ABNORMAL HIGH (ref 11–51)

## 2016-07-16 LAB — CBC
HEMATOCRIT: 43.2 % (ref 36.0–46.0)
HEMOGLOBIN: 14.6 g/dL (ref 12.0–15.0)
MCH: 31.3 pg (ref 26.0–34.0)
MCHC: 33.8 g/dL (ref 30.0–36.0)
MCV: 92.7 fL (ref 78.0–100.0)
Platelets: 354 10*3/uL (ref 150–400)
RBC: 4.66 MIL/uL (ref 3.87–5.11)
RDW: 14.3 % (ref 11.5–15.5)
WBC: 10.8 10*3/uL — ABNORMAL HIGH (ref 4.0–10.5)

## 2016-07-16 LAB — URINALYSIS, ROUTINE W REFLEX MICROSCOPIC
BILIRUBIN URINE: NEGATIVE
Glucose, UA: NEGATIVE mg/dL
KETONES UR: NEGATIVE mg/dL
Nitrite: NEGATIVE
Protein, ur: 100 mg/dL — AB
SPECIFIC GRAVITY, URINE: 1.008 (ref 1.005–1.030)
pH: 5 (ref 5.0–8.0)

## 2016-07-16 LAB — COMPREHENSIVE METABOLIC PANEL
ALBUMIN: 3.4 g/dL — AB (ref 3.5–5.0)
ALT: 196 U/L — ABNORMAL HIGH (ref 14–54)
ANION GAP: 7 (ref 5–15)
AST: 86 U/L — AB (ref 15–41)
Alkaline Phosphatase: 110 U/L (ref 38–126)
BILIRUBIN TOTAL: 0.8 mg/dL (ref 0.3–1.2)
BUN: 14 mg/dL (ref 6–20)
CO2: 19 mmol/L — AB (ref 22–32)
Calcium: 8.9 mg/dL (ref 8.9–10.3)
Chloride: 113 mmol/L — ABNORMAL HIGH (ref 101–111)
Creatinine, Ser: 1.01 mg/dL — ABNORMAL HIGH (ref 0.44–1.00)
GFR calc Af Amer: >60 mL/min (ref >60)
GFR calc non Af Amer: >60 mL/min (ref >60)
GLUCOSE: 81 mg/dL (ref 65–99)
POTASSIUM: 3.3 mmol/L — AB (ref 3.5–5.1)
SODIUM: 139 mmol/L (ref 135–145)
Total Protein: 6.7 g/dL (ref 6.5–8.1)

## 2016-07-16 LAB — PROTEIN / CREATININE RATIO, URINE
Creatinine, Urine: 43.76 mg/dL
Protein Creatinine Ratio: 3.98 mg/mg{Cre} — ABNORMAL HIGH (ref 0.00–0.15)
Total Protein, Urine: 174 mg/dL

## 2016-07-16 LAB — PREGNANCY, URINE: PREG TEST UR: NEGATIVE

## 2016-07-16 LAB — HCG, QUANTITATIVE, PREGNANCY: HCG, BETA CHAIN, QUANT, S: 1 m[IU]/mL (ref <5)

## 2016-07-16 LAB — C-REACTIVE PROTEIN: CRP: 1 mg/dL — ABNORMAL HIGH (ref <1.0)

## 2016-07-16 LAB — SEDIMENTATION RATE: SED RATE: 4 mm/h (ref 0–22)

## 2016-07-16 LAB — LACTIC ACID, PLASMA: Lactic Acid, Venous: 1.8 mmol/L (ref 0.5–1.9)

## 2016-07-16 MED ORDER — ONDANSETRON HCL 4 MG/2ML IJ SOLN: 4.0000 mg | Freq: Four times a day (QID) | INTRAMUSCULAR | Status: DC | PRN

## 2016-07-16 MED ORDER — SODIUM CHLORIDE 0.9 % IV SOLN
INTRAVENOUS | Status: DC
  Administered 2016-07-16: via INTRAVENOUS

## 2016-07-16 MED ORDER — ONDANSETRON HCL 4 MG PO TABS
4.0000 mg | ORAL_TABLET | Freq: Four times a day (QID) | ORAL | Status: DC | PRN
  Administered 2016-07-16: 4 mg via ORAL
  Filled 2016-07-16: qty 1

## 2016-07-16 MED ORDER — ONDANSETRON 4 MG PO TBDP
4.0000 mg | ORAL_TABLET | Freq: Once | ORAL | Status: AC | PRN
  Administered 2016-07-16: 4 mg via ORAL

## 2016-07-16 MED ORDER — OXYCODONE-ACETAMINOPHEN 5-325 MG PO TABS
ORAL_TABLET | ORAL | Status: AC
  Filled 2016-07-16: qty 1

## 2016-07-16 MED ORDER — DEXTROSE 5 % IV SOLN
1.0000 g | Freq: Once | INTRAVENOUS | Status: AC
  Administered 2016-07-16: 1 g via INTRAVENOUS
  Filled 2016-07-16: qty 10

## 2016-07-16 MED ORDER — ACETAMINOPHEN 325 MG PO TABS: 650.0000 mg | ORAL_TABLET | Freq: Four times a day (QID) | ORAL | Status: DC | PRN

## 2016-07-16 MED ORDER — ONDANSETRON 4 MG PO TBDP
ORAL_TABLET | ORAL | Status: AC
  Filled 2016-07-16: qty 1

## 2016-07-16 MED ORDER — ACETAMINOPHEN 650 MG RE SUPP: 650.0000 mg | Freq: Four times a day (QID) | RECTAL | Status: DC | PRN

## 2016-07-16 MED ORDER — HYDROMORPHONE HCL 2 MG/ML IJ SOLN
1.0000 mg | Freq: Once | INTRAMUSCULAR | Status: AC
  Administered 2016-07-16: 1 mg via INTRAVENOUS
  Filled 2016-07-16: qty 1

## 2016-07-16 MED ORDER — METHYLPREDNISOLONE SODIUM SUCC 40 MG IJ SOLR: 40.0000 mg | Freq: Two times a day (BID) | INTRAMUSCULAR | Status: DC

## 2016-07-16 MED ORDER — ONDANSETRON HCL 4 MG/2ML IJ SOLN
4.0000 mg | Freq: Once | INTRAMUSCULAR | Status: AC
  Administered 2016-07-16: 4 mg via INTRAVENOUS
  Filled 2016-07-16: qty 2

## 2016-07-16 MED ORDER — CEFTRIAXONE SODIUM 1 G IJ SOLR
1.0000 g | INTRAMUSCULAR | Status: DC
  Filled 2016-07-16: qty 10

## 2016-07-16 MED ORDER — MYCOPHENOLATE MOFETIL 250 MG PO CAPS
1000.0000 mg | ORAL_CAPSULE | Freq: Two times a day (BID) | ORAL | Status: DC
  Administered 2016-07-16: 1000 mg via ORAL
  Filled 2016-07-16: qty 4

## 2016-07-16 MED ORDER — MORPHINE SULFATE (PF) 2 MG/ML IV SOLN
2.0000 mg | INTRAVENOUS | Status: DC | PRN
Start: 2016-07-16 — End: 2016-07-17
  Administered 2016-07-16: 2 mg via INTRAVENOUS
  Filled 2016-07-16: qty 1

## 2016-07-16 MED ORDER — ASPIRIN EC 81 MG PO TBEC: 81.0000 mg | DELAYED_RELEASE_TABLET | Freq: Every day | ORAL | Status: DC

## 2016-07-16 MED ORDER — MYCOPHENOLATE MOFETIL 500 MG PO TABS
1000.0000 mg | ORAL_TABLET | Freq: Two times a day (BID) | ORAL | Status: DC
  Filled 2016-07-16: qty 2

## 2016-07-16 MED ORDER — HYDROCORTISONE NA SUCCINATE PF 100 MG IJ SOLR
100.0000 mg | Freq: Once | INTRAMUSCULAR | Status: AC
  Administered 2016-07-16: 100 mg via INTRAVENOUS
  Filled 2016-07-16: qty 2

## 2016-07-16 MED ORDER — SODIUM CHLORIDE 0.9 % IV SOLN: INTRAVENOUS | Status: DC

## 2016-07-16 MED ORDER — OXYCODONE-ACETAMINOPHEN 5-325 MG PO TABS
1.0000 | ORAL_TABLET | ORAL | Status: DC | PRN
  Administered 2016-07-16: 1 via ORAL

## 2016-07-16 MED ORDER — SODIUM CHLORIDE 0.9 % IV BOLUS (SEPSIS)
1000.0000 mL | Freq: Once | INTRAVENOUS | Status: AC
  Administered 2016-07-16: 1000 mL via INTRAVENOUS

## 2016-07-16 NOTE — ED Notes (Signed)
Patient transported to US 

## 2016-07-16 NOTE — ED Notes (Signed)
ED Provider at bedside. 

## 2016-07-16 NOTE — ED Triage Notes (Signed)
Pt presents for evaluation of RUQ abd pain with N/V. Pt denies diarrhea. Pt. Reports recurrent pain intermittently. Pt. AxO x4, ambulatory on arrival to ED.

## 2016-07-16 NOTE — ED Provider Notes (Addendum)
[facility]-EMERGENCY DEPT Provider Note   CSN: [id number] Arrival date & time: 06/30/2016  1359   By signing my name below, I, Cynda AcresHailei Fulton, attest that this documentation has been prepared under the direction and in the presence of Cathren LaineKevin Tanina Barb, MD. Electronically Signed: Cynda AcresHailei Fulton, Scribe. 06/24/2016. 5:55 PM.  History   Chief Complaint Chief Complaint  Patient presents with  . Abdominal Pain    HPI Comments: Wendy Kaufman is a 30 y.o. female with a PMHx of lupus, who presents to the Emergency Department complaining of RUQ abdominal pain that radiates to the back that began earlier today. Pain constant, dull, severe. Patient has associated nausea, vomiting, shortness of breath, and cough. Per family patient has had similar recurrent episodes. She has had no abdominal surgeries in the past. There are no modifying factors indicated. She denies any fever or diarrhea.   The history is provided by a relative. No language interpreter was used.    Past Medical History:  Diagnosis Date  . Lupus 03/09/14  . Lupus   . 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 Medical history non-contributory     Patient Active Problem List   Diagnosis Date Noted  . Hoarseness of voice 12/08/2015  . On Depo-Provera for contraception 07/07/2015  . 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 Spanish speaking patient 07/07/2015  . Proteinuria 07/07/2015  . Reactive thrombocytosis 02/22/2015  . 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 Assistance with transportation 02/21/2015  . Chest pain 12/09/2014  . Normocytic anemia 12/09/2014  . Lupus nephritis: Probable/ rule out 12/09/2014  . Sore throat 10/05/2014  . Heme positive stool 08/30/2014  . 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 Anemia, iron deficiency 03/10/2014    Past Surgical History:  Procedure Laterality Date  . AXILLARY LYMPH NODE BIOPSY Right 03/11/2014   Procedure: AXILLARY LYMPH NODE BIOPSY;  Surgeon:  Wilmon ArmsMatthew K. Corliss Skainssuei, MD;  Location: [facility] OR;  Service: General;  Laterality: Right;    OB History    Gravida Para Term Preterm AB Living   3 3 3  0 0 1   SAB TAB Ectopic Multiple Live Births   0 0 0   1       Home Medications    Prior to Admission medications   Medication Sig Start Date End Date Taking? Authorizing Provider  aspirin EC 81 MG tablet Take 1 tablet (81 mg total) by mouth daily. 06/21/16   Dessa PhiJosalyn Funches, MD  Biotin 1000 MCG tablet Take 1 tablet (1 mg total) by mouth daily. 12/10/15   Josalyn Funches, MD  calcium carbonate (OS-CAL - DOSED IN MG OF ELEMENTAL CALCIUM) 1250 (500 CA) MG tablet Take 1 tablet (1,250 mg total) by mouth daily with breakfast. Reported on 07/07/2015 Patient not taking: Reported on 06/21/2016 07/07/15   Dessa PhiJosalyn Funches, MD  Cholecalciferol (VITAMIN D-1000 MAX ST) 1000 UNITS tablet Take 2 tablets (2,000 Units total) by mouth daily. Reported on 07/07/2015 Patient not taking: Reported on 06/21/2016 07/07/15   Dessa PhiJosalyn Funches, MD  hydroxychloroquine (PLAQUENIL) 200 MG tablet Take 1 tablet (200 mg total) by mouth daily. 12/08/15   Josalyn Funches, MD  lisinopril (PRINIVIL,ZESTRIL) 5 MG tablet Take 1 tablet (5 mg total) by mouth daily. Reported on 07/07/2015 Patient not taking: Reported on 06/21/2016 03/11/16   Dessa PhiJosalyn Funches, MD  medroxyPROGESTERone (DEPO-PROVERA) 150 MG/ML injection Inject 1 mL (150 mg total) into the muscle every 3 (three) months. Patient not taking: Reported on 06/21/2016 07/07/15   Dessa PhiJosalyn Funches, MD  mycophenolate (CELLCEPT) 500 MG tablet Take 2 tablets (1,000 mg total) by mouth 2 (two) times daily. 03/11/16   Josalyn Funches, MD  omeprazole (PRILOSEC) 40 MG capsule Take 1 capsule (40 mg total) by mouth daily. Patient not taking: Reported on 06/21/2016 03/11/16   Dessa Phi, MD  predniSONE (DELTASONE) 20 MG tablet Take 1 tablet (20 mg total) by mouth daily with breakfast. 03/29/16   Dessa Phi, MD  sulfamethoxazole-trimethoprim (BACTRIM  DS,SEPTRA DS) 800-160 MG tablet Take 1 tablet by mouth 3 (three) times a week. Every Monday, Wednesday and Friday Patient not taking: Reported on 06/21/2016 12/10/15   Dessa Phi, MD    Family History Family History  Problem Relation Age of Onset  . Bronchitis Brother   . Bronchitis Brother   . Lupus Neg Hx     Social History Social History  Substance Use Topics  . Smoking status: Never Smoker  . Smokeless tobacco: Never Used  . Alcohol use 1.2 - 1.8 oz/week    2 - 3 Cans of beer per week     Allergies   Patient has no known allergies.   Review of Systems Review of Systems  Respiratory: Positive for cough and shortness of breath.   Gastrointestinal: Positive for nausea and vomiting. Negative for diarrhea.  All other systems reviewed and are negative.    Physical Exam Updated Vital Signs BP 123/88 (BP Location: Right Arm)   Pulse 105   Temp 97.8 F (36.6 C) (Oral)   Resp 22   SpO2 100%   Physical Exam  Constitutional: She is oriented to person, place, and time. She appears well-developed and well-nourished. No distress.  HENT:  Head: Normocephalic and atraumatic.  Eyes: EOM are normal.  Neck: Normal range of motion.  Cardiovascular: Normal rate, regular rhythm and normal heart sounds.   Pulmonary/Chest: Effort normal and breath sounds normal.  Abdominal: Soft. She exhibits no distension. There is no tenderness.  Mod RUQ tenderness   Genitourinary: Guaiac stool: .edthis.  Genitourinary Comments: Mild right cva tenderness.   Musculoskeletal: Normal range of motion.  Neurological: She is alert and oriented to person, place, and time.  Skin: Skin is warm and dry.  Psychiatric: She has a normal mood and affect. Judgment normal.  Anxious   Nursing note and vitals reviewed.    ED Treatments / Results  DIAGNOSTIC STUDIES: Oxygen Saturation is 100% on RA, normal by my interpretation.    COORDINATION OF CARE: 5:55 PM Discussed treatment plan with pt at  bedside and pt agreed to plan.  Labs (all labs ordered are listed, but only abnormal results are displayed) Results for orders placed or performed during the hospital encounter of 06/25/2016  Lipase, blood  Result Value Ref Range   Lipase 53 (H) 11 - 51 U/L  Comprehensive metabolic panel  Result Value Ref Range   Sodium 139 135 - 145 mmol/L   Potassium 3.3 (L) 3.5 - 5.1 mmol/L   Chloride 113 (H) 101 - 111 mmol/L   CO2 19 (L) 22 - 32 mmol/L   Glucose, Bld 81 65 - 99 mg/dL   BUN 14 6 - 20 mg/dL   Creatinine, Ser 1.61 (H) 0.44 - 1.00 mg/dL   Calcium 8.9 8.9 - 09.6 mg/dL   Total Protein 6.7 6.5 - 8.1 g/dL   Albumin 3.4 (L) 3.5 - 5.0 g/dL   AST 86 (H) 15 - 41 U/L   ALT 196 (H) 14 - 54 U/L   Alkaline Phosphatase 110 38 - 126  U/L   Total Bilirubin 0.8 0.3 - 1.2 mg/dL   GFR calc non Af Amer >60 >60 mL/min   GFR calc Af Amer >60 >60 mL/min   Anion gap 7 5 - 15  CBC  Result Value Ref Range   WBC 10.8 (H) 4.0 - 10.5 K/uL   RBC 4.66 3.87 - 5.11 MIL/uL   Hemoglobin 14.6 12.0 - 15.0 g/dL   HCT 19.143.2 47.836.0 - 29.546.0 %   MCV 92.7 78.0 - 100.0 fL   MCH 31.3 26.0 - 34.0 pg   MCHC 33.8 30.0 - 36.0 g/dL   RDW 62.114.3 30.811.5 - 65.715.5 %   Platelets 354 150 - 400 K/uL  Urinalysis, Routine w reflex microscopic  Result Value Ref Range   Color, Urine YELLOW YELLOW   APPearance HAZY (A) CLEAR   Specific Gravity, Urine 1.008 1.005 - 1.030   pH 5.0 5.0 - 8.0   Glucose, UA NEGATIVE NEGATIVE mg/dL   Hgb urine dipstick MODERATE (A) NEGATIVE   Bilirubin Urine NEGATIVE NEGATIVE   Ketones, ur NEGATIVE NEGATIVE mg/dL   Protein, ur 846100 (A) NEGATIVE mg/dL   Nitrite NEGATIVE NEGATIVE   Leukocytes, UA LARGE (A) NEGATIVE   RBC / HPF 0-5 0 - 5 RBC/hpf   WBC, UA TOO NUMEROUS TO COUNT 0 - 5 WBC/hpf   Bacteria, UA FEW (A) NONE SEEN   Squamous Epithelial / LPF 0-5 (A) NONE SEEN   Mucous PRESENT    Hyaline Casts, UA PRESENT    Non Squamous Epithelial 6-30 (A) NONE SEEN   Crystals PRESENT (A) NEGATIVE  hCG,  quantitative, pregnancy  Result Value Ref Range   hCG, Beta Chain, Quant, S 1 <5 mIU/mL  Protein / creatinine ratio, urine  Result Value Ref Range   Creatinine, Urine 43.76 mg/dL   Total Protein, Urine 174 mg/dL   Protein Creatinine Ratio 3.98 (H) 0.00 - 0.15 mg/mg[Cre]  Sedimentation rate  Result Value Ref Range   Sed Rate 4 0 - 22 mm/hr  C-reactive protein  Result Value Ref Range   CRP 1.0 (H) <1.0 mg/dL   Dg Chest 2 View  Result Date: 06/25/2016 CLINICAL DATA:  Shortness of breath and chest pain radiating through to the back. Also flank pain. Duration of symptoms 2 days. History of lupus erythematosus. Lupus nephritis. Myopathy. EXAM: CHEST  2 VIEW COMPARISON:  PA and lateral chest x-ray of December 27, 2015 FINDINGS: The lungs are adequately inflated. There is no focal infiltrate. The heart is normal in size. There is prominence of the left atrial appendage. There is no pleural effusion. The mediastinum is normal in width. The bony thorax exhibits no acute abnormality. IMPRESSION: There is no active cardiopulmonary disease. Electronically Signed   By: David  Swazil[place] M.D.   On: 06/25/2016 16:37   [place] Abdomen Complete  Result Date: July 12, 2016 CLINICAL DATA:  Acute onset of right upper quadrant and right flank pain. Nausea and vomiting. Initial encounter. EXAM: ABDOMEN ULTRASOUND COMPLETE COMPARISON:  CT of the abdomen and pelvis performed 02/13/2014 FINDINGS: Gallbladder: No gallstones or wall thickening visualized. No sonographic Murphy sign noted by sonographer. Common bile duct: Diameter: 0.2 cm, within normal limits in caliber. Liver: No focal lesion identified. Within normal limits in parenchymal echogenicity. Parenchymal echotexture is somewhat heterogeneous in appearance. IVC: No abnormality visualized. Pancreas: Visualized portion unremarkable. Spleen: Not visualized. Right Kidney: Length: 10.7 cm. Echogenicity within normal limits. No mass or hydronephrosis visualized. Left Kidney: Length:  10.0 cm. Echogenicity within normal limits. No mass or  hydronephrosis visualized. Abdominal aorta: No aneurysm visualized. Other findings: None. IMPRESSION: No acute abnormality seen within the abdomen. Electronically Signed   By: Roanna Raider M.D.   On: August 05, 2016 20:21      EKG  EKG Interpretation None       Radiology No results found.  Procedures Procedures (including critical care time)  Medications Ordered in ED Medications  oxyCODONE-acetaminophen (PERCOCET/ROXICET) 5-325 MG per tablet 1 tablet (1 tablet Oral Given 2016/08/05 1449)  ondansetron (ZOFRAN-ODT) disintegrating tablet 4 mg (4 mg Oral Given 08-05-2016 1436)     Initial Impression / Assessment and Plan / ED Course  I have reviewed the triage vital signs and the nursing notes.  Pertinent labs & imaging results that were available during my care of the patient were reviewed by me and considered in my medical decision making (see chart for details).  Clinical Course    Iv ns bolus. Labs. Dilaudid iv.  zofran iv.  Additional ivf.   Mild AKI on labs.   Given dehydration, nv, lupus, aki, and pyelo - will admit.  Rocephin iv.   Hospitalists consulted for admission.   Discussed pt with Dr Toniann Fail - he requests temp orders, tele, inpatient, and requests dose of hydrocortisone in ED.     Final Clinical Impressions(s) / ED Diagnoses   Final diagnoses:  None    New Prescriptions New Prescriptions   No medications on file   I personally performed the services described in this documentation, which was scribed in my presence. The recorded information has been reviewed and considered. Cathren Laine, MD        Cathren Laine, MD Aug 05, 2016 2109

## 2016-07-16 NOTE — H&P (Signed)
History and Physical    Valerie SaltsMaria I Sanchez-Flores ZOX:[id number] DOB: 06/12/1986 DOA: 11-24-2015  PCP: Lora PaulaFUNCHES, JOSALYN C, MD  Patient coming from: Home.  Spanish interpreter used.  Chief Complaint: Nausea vomiting and right upper quadrant pain.  HPI: Valerie SaltsMaria I Sanchez-Flores is a 30 y.o. 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 Patient also has been having right upper quadrant pain and pain around the right lower ribs. In the ER UA shows features consistent with UTI. Sonogram of abdomen was unremarkable. Since patient is unable to keep anything patient will be admitted for IV antibiotics fluids and further management.   ED Course: UA was showing features consistent with UTI. Patient was started on IV ceftriaxone.  Review of Systems: As per HPI, rest all negative.   Past Medical History:  Diagnosis Date  . Lupus 03/09/14  . Lupus   . 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 Medical history non-contributory     Past Surgical History:  Procedure Laterality Date  . AXILLARY LYMPH NODE BIOPSY Right 03/11/2014   Procedure: AXILLARY LYMPH NODE BIOPSY;  Surgeon: Wilmon ArmsMatthew K. Corliss Skainssuei, MD;  Location: [facility] OR;  Service: General;  Laterality: Right;     reports that she has never smoked. She has never used smokeless tobacco. She reports that she drinks about 1.2 - 1.8 oz of alcohol per week . She reports that she does not use drugs.  No Known Allergies  Family History  Problem Relation Age of Onset  . Bronchitis Brother   . Bronchitis Brother   . Lupus Neg Hx     Prior to Admission medications   Medication Sig Start Date End Date Taking? Authorizing Provider  aspirin EC 81 MG tablet Take 1 tablet (81 mg total) by mouth daily. 06/21/16   Dessa PhiJosalyn Funches, MD  Biotin 1000 MCG tablet Take 1 tablet (1 mg total) by mouth daily. 12/10/15   Josalyn Funches,  MD  calcium carbonate (OS-CAL - DOSED IN MG OF ELEMENTAL CALCIUM) 1250 (500 CA) MG tablet Take 1 tablet (1,250 mg total) by mouth daily with breakfast. Reported on 07/07/2015 Patient not taking: Reported on 06/21/2016 07/07/15   Dessa PhiJosalyn Funches, MD  Cholecalciferol (VITAMIN D-1000 MAX ST) 1000 UNITS tablet Take 2 tablets (2,000 Units total) by mouth daily. Reported on 07/07/2015 Patient not taking: Reported on 06/21/2016 07/07/15   Dessa PhiJosalyn Funches, MD  hydroxychloroquine (PLAQUENIL) 200 MG tablet Take 1 tablet (200 mg total) by mouth daily. 12/08/15   Josalyn Funches, MD  lisinopril (PRINIVIL,ZESTRIL) 5 MG tablet Take 1 tablet (5 mg total) by mouth daily. Reported on 07/07/2015 Patient not taking: Reported on 06/21/2016 03/11/16   Dessa PhiJosalyn Funches, MD  medroxyPROGESTERone (DEPO-PROVERA) 150 MG/ML injection Inject 1 mL (150 mg total) into the muscle every 3 (three) months. Patient not taking: Reported on 06/21/2016 07/07/15   Dessa PhiJosalyn Funches, MD  mycophenolate (CELLCEPT) 500 MG tablet Take 2 tablets (1,000 mg total) by mouth 2 (two) times daily. 03/11/16   Josalyn Funches, MD  omeprazole (PRILOSEC) 40 MG capsule Take 1 capsule (40 mg total) by mouth daily. Patient not taking: Reported on 06/21/2016 03/11/16   Dessa PhiJosalyn Funches, MD  predniSONE (DELTASONE) 20 MG tablet Take 1 tablet (20 mg total) by mouth daily with breakfast. 03/29/16   Dessa PhiJosalyn Funches, MD  sulfamethoxazole-trimethoprim (BACTRIM DS,SEPTRA DS) 800-160 MG tablet Take 1 tablet by mouth 3 (three) times a week. Every  Monday, Wednesday and Friday Patient not taking: Reported on 06/21/2016 12/10/15   Dessa PhiJosalyn Funches, MD    Physical Exam: Vitals:   07/10/2016 1650 06/21/2016 1755 07/07/2016 2126 07/13/2016 2212  BP: 123/88 114/95 101/68 100/73  Pulse: 105 101 88 96  Resp: 22 (!) 36 18 19  Temp:  98 F (36.7 C) 97.9 F (36.6 C) 98.6 F (37 C)  TempSrc:  Oral Oral Oral  SpO2: 100% 98% 96% 98%  Weight:    54.5 kg (120 lb 2.4 oz)      Constitutional:  Moderately built and nourished. Vitals:   06/21/2016 1650 07/18/2016 1755 06/24/2016 2126 07/09/2016 2212  BP: 123/88 114/95 101/68 100/73  Pulse: 105 101 88 96  Resp: 22 (!) 36 18 19  Temp:  98 F (36.7 C) 97.9 F (36.6 C) 98.6 F (37 C)  TempSrc:  Oral Oral Oral  SpO2: 100% 98% 96% 98%  Weight:    54.5 kg (120 lb 2.4 oz)   Eyes: Anicteric no pallor. ENMT: No discharge from the ears eyes nose or mouth. Neck: No mass felt. No neck rigidity. Respiratory: No rhonchi or crepitations. Cardiovascular: S1 and S2 heard. No murmurs appreciated. Abdomen: Soft nontender bowel sounds present. Musculoskeletal: No edema. No joint effusion. Skin: No rash. Skin is warm. Neurologic: Alert awake oriented to time place and person. Moves all extremities. Psychiatric: Appears normal. Normal affect.   Labs on Admission: I have personally reviewed following labs and imaging studies  CBC:  Recent Labs Lab 07/13/2016 1435  WBC 10.8*  HGB 14.6  HCT 43.2  MCV 92.7  PLT 354   Basic Metabolic Panel:  Recent Labs Lab 07/19/2016 1435  NA 139  K 3.3*  CL 113*  CO2 19*  GLUCOSE 81  BUN 14  CREATININE 1.01*  CALCIUM 8.9   GFR: Estimated Creatinine Clearance: 61.5 mL/min (by C-G formula based on SCr of 1.01 mg/dL (H)). Liver Function Tests:  Recent Labs Lab 07/13/2016 1435  AST 86*  ALT 196*  ALKPHOS 110  BILITOT 0.8  PROT 6.7  ALBUMIN 3.4*    Recent Labs Lab 06/30/2016 1435  LIPASE 53*   No results for input(s): AMMONIA in the last 168 hours. Coagulation Profile: No results for input(s): INR, PROTIME in the last 168 hours. Cardiac Enzymes: No results for input(s): CKTOTAL, CKMB, CKMBINDEX, TROPONINI in the last 168 hours. BNP (last 3 results) No results for input(s): PROBNP in the last 8760 hours. HbA1C: No results for input(s): HGBA1C in the last 72 hours. CBG: No results for input(s): GLUCAP in the last 168 hours. Lipid Profile: No results for input(s): CHOL, HDL, LDLCALC, TRIG,  CHOLHDL, LDLDIRECT in the last 72 hours. Thyroid Function Tests: No results for input(s): TSH, T4TOTAL, FREET4, T3FREE, THYROIDAB in the last 72 hours. Anemia Panel: No results for input(s): VITAMINB12, FOLATE, FERRITIN, TIBC, IRON, RETICCTPCT in the last 72 hours. Urine analysis:    Component Value Date/Time   COLORURINE YELLOW 06/24/2016 1930   APPEARANCEUR HAZY (A) 07/04/2016 1930   LABSPEC 1.008 07/07/2016 1930   PHURINE 5.0 07/15/2016 1930   GLUCOSEU NEGATIVE 07/12/2016 1930   HGBUR MODERATE (A) 06/28/2016 1930   BILIRUBINUR NEGATIVE 06/27/2016 1930   BILIRUBINUR negative 03/11/2016 1028   KETONESUR NEGATIVE 07/11/2016 1930   PROTEINUR 100 (A) 07/13/2016 1930   UROBILINOGEN 0.2 03/11/2016 1028   UROBILINOGEN 1.0 12/08/2014 2032   NITRITE NEGATIVE 07/04/2016 1930   LEUKOCYTESUR LARGE (A) 07/09/2016 1930   Sepsis Labs: @LABRCNTIP (procalcitonin:4,lacticidven:4) )No results found for  this or any previous visit (from the past 240 hour(s)).   Radiological Exams on Admission: US Abdomen Complete  Result Date: Jul 31, 2016 CLINICAL DATA:  Acute onset of right upper quadrant and right flank pain. Nausea and vomiting. Initial encounter. EXAM: ABDOMEN ULTRASOUND COMPLETE COMPARISON:  CT of the abdomen and pelvis performed 02/13/2014 FINDINGS: Gallbladder: No gallstones or wall thickening visualized. No sonographic Murphy sign noted by sonographer. Common bile duct: Diameter: 0.2 cm, within normal limits in caliber. Liver: No focal lesion identified. Within normal limits in parenchymal echogenicity. Parenchymal echotexture is somewhat heterogeneous in appearance. IVC: No abnormality visualized. Pancreas: Visualized portion unremarkable. Spleen: Not visualized. Right Kidney: Length: 10.7 cm. Echogenicity within normal limits. No mass or hydronephrosis visualized. Left Kidney: Length: 10.0 cm. Echogenicity within normal limits. No mass or hydronephrosis visualized. Abdominal aorta: No aneurysm  visualized. Other findings: None. IMPRESSION: No acute abnormality seen within the abdomen. Electronically Signed   By: Roanna Raider M.D.   On: 07/31/16 20:21     Assessment/Plan Principal Problem:   Acute pyelonephritis Active Problems:   Nausea with vomiting   History of lupus    1. Acute pyelonephritis - symptoms are concerning for pyelonephritis. Follow urine cultures continue ceftriaxone. Continue hydration. If pain does not improve may consider further imaging. Since patient also has right-sided lower rib pain I have ordered VQ scan to rule out PE. 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 2. Nausea vomiting probably from pyelonephritis - continue with IV hydration and I have placed patient on clear liquid diet for now. 3. History of SLE - since patient is vomiting I have placed patient on IV Solu-Medrol for now instead of prednisone. Continue CellCept. 4. History of pericarditis - patient has right-sided rib pain which is different from her previous pericarditis pain as per the patient. Follow VQ scan.   DVT prophylaxis: SCDs for now. Code Status: Lovenox.  Family Communication: Discussed with patient.  Disposition Plan: Home.  Consults called: None.  Admission status: Inpatient.    Eduard Clos MD [facility] Pager [phone].  If 7PM-7AM, please contact night-coverage [email or web address] Password [facility]  Jul 31, 2016, 10:48 PM

## 2016-07-17 LAB — COMPREHENSIVE METABOLIC PANEL
ALK PHOS: 93 U/L (ref 38–126)
ALT: 130 U/L — ABNORMAL HIGH (ref 14–54)
ANION GAP: 10 (ref 5–15)
AST: 38 U/L (ref 15–41)
Albumin: 2.7 g/dL — ABNORMAL LOW (ref 3.5–5.0)
BUN: 11 mg/dL (ref 6–20)
CALCIUM: 7.8 mg/dL — AB (ref 8.9–10.3)
CHLORIDE: 119 mmol/L — AB (ref 101–111)
CO2: 11 mmol/L — AB (ref 22–32)
Creatinine, Ser: 0.9 mg/dL (ref 0.44–1.00)
GFR calc non Af Amer: >60 mL/min (ref >60)
Glucose, Bld: 135 mg/dL — ABNORMAL HIGH (ref 65–99)
Potassium: 4.3 mmol/L (ref 3.5–5.1)
SODIUM: 140 mmol/L (ref 135–145)
Total Bilirubin: 0.6 mg/dL (ref 0.3–1.2)
Total Protein: 5.6 g/dL — ABNORMAL LOW (ref 6.5–8.1)

## 2016-07-17 LAB — CBC WITH DIFFERENTIAL/PLATELET
BASOS PCT: 0 %
Basophils Absolute: 0 10*3/uL (ref 0.0–0.1)
Eosinophils Absolute: 0 10*3/uL (ref 0.0–0.7)
Eosinophils Relative: 0 %
HEMATOCRIT: 42.5 % (ref 36.0–46.0)
HEMOGLOBIN: 14 g/dL (ref 12.0–15.0)
Lymphocytes Relative: 13 %
Lymphs Abs: 0.8 10*3/uL (ref 0.7–4.0)
MCH: 31 pg (ref 26.0–34.0)
MCHC: 32.9 g/dL (ref 30.0–36.0)
MCV: 94 fL (ref 78.0–100.0)
MONOS PCT: 5 %
Monocytes Absolute: 0.3 10*3/uL (ref 0.1–1.0)
NEUTROS ABS: 5 10*3/uL (ref 1.7–7.7)
NEUTROS PCT: 82 %
Platelets: 328 10*3/uL (ref 150–400)
RBC: 4.52 MIL/uL (ref 3.87–5.11)
RDW: 14.6 % (ref 11.5–15.5)
WBC: 6.2 10*3/uL (ref 4.0–10.5)

## 2016-07-17 LAB — GLUCOSE, CAPILLARY: Glucose-Capillary: 119 mg/dL — ABNORMAL HIGH (ref 65–99)

## 2016-07-17 MED ORDER — METHYLPREDNISOLONE SODIUM SUCC 125 MG IJ SOLR
INTRAMUSCULAR | Status: AC
  Filled 2016-07-17: qty 2

## 2016-07-17 MED ORDER — TENECTEPLASE 50 MG IV KIT
30.0000 mg | PACK | INTRAVENOUS | Status: AC
  Administered 2016-07-17: 30 mg via INTRAVENOUS
  Filled 2016-07-17: qty 10

## 2016-07-17 MED FILL — Medication: Qty: 1 | Status: AC

## 2016-07-18 LAB — URINE CULTURE

## 2016-07-22 NOTE — Progress Notes (Signed)
   07/04/2016 1115  Clinical Encounter Type  Visited With Patient and family together  Visit Type Death  Spiritual Encounters  Spiritual Needs Ritual  Stress Factors  Patient Stress Factors None identified  Family Stress Factors None identified  Referred by request for Last Rites. Offered prayer and anointing.

## 2016-07-22 NOTE — Progress Notes (Signed)
Late Entry: ~ 810-0830amRon Kaufman AC and Wendy Kaufman DD attempted to contact patients brother at the number listed in emergency contact section [phone]. Made multiple attempts with no success.  Upon looking in patients purse found  Number to an MD office who gave number [phone]. Wendy Kaufman was able to call this number and reached patients brother to make aware that patient was critically ill and suggested that family should come to hospital ASAP. Patients brother with limited english, Wendy Kaufman Herrera RN was able to speak with brother via phone to relay the message in spanish. Patients brother stated that he would be on his way and would arrive in about 30 min to 1 hour.   ~ 0930  patients sister in law Wendy Kaufman arrived to the department with a small child (found to be patients daughter). Wendy Kaufman, Wendy Kaufman, Wendy Kaufman Press photographerCharge Nurse, greeted patients family on the department. Family taken to dictation room and briefly made aware that patient had passed away. Called interpreter Wendy Kaufman. Patient family was informed of this am events, and patients death.  Called Pediatrics Social worker Wendy Kaufman to come assist due to there being a small child present.    Questions answered, and resources and support offered. Family still present in patient room at 1218pm  Wendy Kaufman, Wendy Kaufman

## 2016-07-22 NOTE — Progress Notes (Signed)
   2016/01/27 0800  Clinical Encounter Type  Visited With Health care provider  Visit Type Code;Death  Referral From Nurse  Spiritual Encounters  Spiritual Needs Prayer  Chaplain paged to Code Blue on 6E, where family could not be contacted. Time of death announced at 0827. Chaplain offered prayers for patient at death, given likelihood that she was MattelCatholic. Change of shift occurred, so another chaplain was sent up to replace her. Yuniel Blaney, Chaplain

## 2016-07-22 NOTE — Discharge Summary (Signed)
Death Summary  Wendy Kaufman YQM:[id number] DOB: [birth year]  PCP: Lora PaulaFUNCHES, JOSALYN C, MD  Admit date: Feb 21, 2016 Time of death: 8:25am 07/06/2016  Time spent: >5930mins    Discharge Diagnoses:  Active Hospital Problems   Diagnosis Date Noted  . Acute pyelonephritis Feb 21, 2016  . Nausea with vomiting Feb 21, 2016  . History of lupus Feb 21, 2016    Resolved Hospital Problems   Diagnosis Date Noted Date Resolved  No resolved problems to display.      Filed Weights   2015/07/27 2212  Weight: 54.5 kg (120 lb 2.4 oz)    History of present illness:  PCP: Lora PaulaFUNCHES, JOSALYN C, MD  Patient coming from: Home.  Spanish interpreter used.  Chief Complaint: Nausea vomiting and right upper quadrant pain.  HPI: Wendy Kaufman is a 31 y.o. 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 Patient also has been having right upper quadrant pain and pain around the right lower ribs. In the ER UA shows features consistent with UTI. Sonogram of abdomen was unremarkable. Since patient is unable to keep anything patient will be admitted for IV antibiotics fluids and further management.   ED Course: UA was showing features consistent with UTI. Patient was started on IV ceftriaxone.  Hospital Course:  Principal Problem:   Acute pyelonephritis Active Problems:   Nausea with vomiting   History of lupus  Patient presented to the hospital with complaining of RUQ abdominal pain that radiates to the back that began earlier today. Pain constant, dull, severe. Patient has associated nausea, vomiting, shortness of breath, and cough. Per family patient has had similar recurrent episodes.due to recurrent episodes of shortness breath, chest pain, cough, n/v. She did not have hypoxia, no edema.  She is admitted to the hospital with a clinical  diagnosis of pyelonephritis, she is started on rocephin, there is also a concern about possible PE, V/Q scan was ordered, CTA was not ordered on admission due to concern about h/o lupus nephritis. At approximately 0745, RN received a call from central telemetry stating that the patients Heart Rate was 26.  patient was found down, pulseless and cyanotic, code blue called.  I examined the patient shortly after the code blue called, she is pulseless, not responding, her pupil was already dilated. Patient received CPR, several rounds of epi/bicarb/calcium/stress dose steroids (detail please see code documentation). No shockable rhythm. She was intubated with ongoing CPR, critical care arrived, due to concern about PE, TNKas was given by critical care. There was no return of spontanous circulation. Patient was pronounced at 8:25.   Cause of death PEA arrest from unclear underline etiology. Though highly suspicious for PE in this patient with lupus history. Multiple family members arrived to the hospital in the morning, I have updated the family, answered questions in the presence of spanish interpreter, Child psychotherapistsocial worker, Orthoptistchaplain, Charity fundraiserN. Family requested autopsy.   Procedures:  Code blue/CPR/emergent intubation  Consultations:  Critical care    pateint  No Known Allergies    The results of significant diagnostics from this hospitalization (including imaging, microbiology, ancillary and laboratory) are listed below for reference.    Significant Diagnostic Studies: Dg Chest 2 View  Result Date: 06/25/2016 CLINICAL DATA:  Shortness of breath and chest pain radiating through to the back. Also flank pain. Duration of symptoms 2 days. History of lupus erythematosus. Lupus nephritis. Myopathy. EXAM: CHEST  2 VIEW  COMPARISON:  PA and lateral chest x-ray of December 27, 2015 FINDINGS: The lungs are adequately inflated. There is no focal infiltrate. The heart is normal in size. There is prominence of the left atrial  appendage. There is no pleural effusion. The mediastinum is normal in width. The bony thorax exhibits no acute abnormality. IMPRESSION: There is no active cardiopulmonary disease. Electronically Signed   By: David  SwazilandJordan M.D.   On: 06/25/2016 16:37   [place] Abdomen Complete  Result Date: 09-04-2015 CLINICAL DATA:  Acute onset of right upper quadrant and right flank pain. Nausea and vomiting. Initial encounter. EXAM: ABDOMEN ULTRASOUND COMPLETE COMPARISON:  CT of the abdomen and pelvis performed 02/13/2014 FINDINGS: Gallbladder: No gallstones or wall thickening visualized. No sonographic Murphy sign noted by sonographer. Common bile duct: Diameter: 0.2 cm, within normal limits in caliber. Liver: No focal lesion identified. Within normal limits in parenchymal echogenicity. Parenchymal echotexture is somewhat heterogeneous in appearance. IVC: No abnormality visualized. Pancreas: Visualized portion unremarkable. Spleen: Not visualized. Right Kidney: Length: 10.7 cm. Echogenicity within normal limits. No mass or hydronephrosis visualized. Left Kidney: Length: 10.0 cm. Echogenicity within normal limits. No mass or hydronephrosis visualized. Abdominal aorta: No aneurysm visualized. Other findings: None. IMPRESSION: No acute abnormality seen within the abdomen. Electronically Signed   By: Roanna RaiderJeffery  Chang M.D.   On: 09-04-2015 20:21    Microbiology: No results found for this or any previous visit (from the past 240 hour(s)).   Labs: Basic Metabolic Panel:  Recent Labs Lab 12-Oct-2015 1435 06/25/2016 0607  NA 139 140  K 3.3* 4.3  CL 113* 119*  CO2 19* 11*  GLUCOSE 81 135*  BUN 14 11  CREATININE 1.01* 0.90  CALCIUM 8.9 7.8*   Liver Function Tests:  Recent Labs Lab 12-Oct-2015 1435 06/23/2016 0607  AST 86* 38  ALT 196* 130*  ALKPHOS 110 93  BILITOT 0.8 0.6  PROT 6.7 5.6*  ALBUMIN 3.4* 2.7*    Recent Labs Lab 12-Oct-2015 1435  LIPASE 53*   No results for input(s): AMMONIA in the last 168  hours. CBC:  Recent Labs Lab 12-Oct-2015 1435 07/06/2016 0607  WBC 10.8* 6.2  NEUTROABS  --  5.0  HGB 14.6 14.0  HCT 43.2 42.5  MCV 92.7 94.0  PLT 354 328   Cardiac Enzymes: No results for input(s): CKTOTAL, CKMB, CKMBINDEX, TROPONINI in the last 168 hours. BNP: BNP (last 3 results)  Recent Labs  06/21/16 1633  BNP 951.4*    ProBNP (last 3 results) No results for input(s): PROBNP in the last 8760 hours.  CBG:  Recent Labs Lab 07/11/2016 0751  GLUCAP 119*       SignedAlbertine Grates:  Kynnadi Dicenso MD, PhD  [facility] 07/05/2016, 10:32 PM

## 2016-07-22 NOTE — Progress Notes (Signed)
Called [facility] to report patient death. Requested to prepare patients eyes prior to taking patient down to the morgue.  Also spoke with Dr. Roda ShuttersXu regarding the Medical Examiner's determination that this is not a medical examiners case. Also discussed that patients family was requesting an autopsy. Autopsy Exam Consent completed and signed by Dr. Roda ShuttersXu.  Also completed death certificate.    Wendy Kaufman, Wendy Kaufman

## 2016-07-22 NOTE — Progress Notes (Signed)
Nuclear medicine has requested that a 2 View CXR be completed prior to the lung scan that the patient is to have done. MD notified.

## 2016-07-22 NOTE — Progress Notes (Signed)
Called and spoke with Wendy Kaufman, medical Examiner @  [phone]. Stated that patient would not be a Medical Examiner case due to her other comorbidities. Stated that if family would like an autopsy and that if the MD agrees they could request the Autopsy and complete the appropriate form to make the request, however there is no guarantee that the pathologist will approve the request. Wendy Kaufman, Wendy Qualeamara Kaufman

## 2016-07-22 NOTE — Progress Notes (Addendum)
Staff attempted to notify family during the code, unsuccessful.   Checked the belongings and found additional number. Contacted the brother by the name of Rella Larvemmanuel: [phone]  Reported to Rella Larvemmanuel that his sister was critically ill and he needed to come to the hospital ASAP.   Rella Larvemmanuel stated he would arrive in about 30 minutes to an hour.   Charting is done by:   Nelma RothmanNatalie Herrera, RN

## 2016-07-22 NOTE — Progress Notes (Signed)
The night shift nurse and I performed bedside report at approximately 0720. I introduced myself to the patient and discussed with her that a urine sample was needed from her. I left the patients bedside. At approximately 0745, I received a call from central telemetry stating that the patients HR was 26. I then run to the patients room to check her status. From there, I found the bed empty. I scanned the room and I did not see the patient. I then checked the bathroom to find it was empty. I ran to the right side of the bed to find the patient face down, unresponsive, with no pulse. I then called out, "I need help in her!" Another nurse and three nurse techs came to help me. We turned her over to find that she was cyanotic. We lifted her into bed and called a code. See code sheet for further details.

## 2016-07-22 NOTE — Procedures (Signed)
Intubation Procedure Note Wendy Kaufman [id number] 06-21-1986  Procedure: Intubation Indications: Respiratory insufficiency  Procedure Details Consent: Unable to obtain consent because of emergent medical necessity. Time Out: Verified patient identification, verified procedure, site/side was marked, verified correct patient position, special equipment/implants available, medications/allergies/relevent history reviewed, required imaging and test results available.  Performed  Maximum sterile technique was used including gloves and mask.  MAC and 3    Evaluation Hemodynamic Status:  ; O2 sats: code in progress with chest compressions Patient's Current Condition: unstable Complications: No apparent complications Patient did tolerate procedure well. Chest X-ray ordered to verify placement.  CXR: pending. RT placed 7 et tube on second attempt with easy cap and ETCO2 verification   Wendy Kaufman, Wendy Kaufman Wendy Kaufman 2015/11/30

## 2016-07-22 NOTE — Progress Notes (Signed)
   07/02/2016 1155  Clinical Encounter Type  Visited With Patient;Patient and family together;Health care provider  Visit Type Follow-up;Psychological support;Spiritual support;Social support;Death  Referral From Family;Nurse;Physician;Social work  Consult/Referral To OrthoptistChaplain  Spiritual Encounters  Spiritual Needs Sacred text;Brochure;Prayer;Ritual;Emotional;Grief support  Stress Factors  Patient Stress Factors None identified  Family Stress Factors Exhausted;Family relationships;Financial concerns;Health changes;Lack of knowledge;Loss    Chaplain present with large extended family, interpreter/translation services, Water quality scientistunit director, social work, Landpeds psychologist, Airline pilotAc. Initial family conference. Family decided to view body, chaplain referred for catholic ritual service of anointing body. Brought list of funeral home services. Offered prayer services, emotional support and hospitality ministry for family.

## 2016-07-22 NOTE — Progress Notes (Signed)
07/19/16 0925am received voice mail from Caesar BookmanJoseph Wiley [phone]) [facility] stating that they had received the patients body at their office, and needed additional contact information for the next of kin. Stated that he had been attempting to call [phone] and [phone] but was unable to reach family. Reviewed chart and provided the following next of kin phone numbers: Harvest Darkmmanuel Sanchez [phone] and Eveline KetoLucia Sanchez [phone] that were listed in the chart.  Also reminded Mr. Paris LoreWiley that they would need to use the interpreter. Afshin Chrystal, Charlyne Qualeamara Johnson

## 2016-07-22 NOTE — Progress Notes (Signed)
   06/24/2016 0900  Clinical Encounter Type  Visited With Patient not available;Health care provider;Other (Comment)  Visit Type Follow-up;Psychological support;Spiritual support;Code;Death;Other (Comment) (staff is visibly distraught, debriefed)  Referral From Chaplain;Nurse;Other (Comment) [facility])  Consult/Referral To Chaplain  Recommendations (Will continue to follow up once family arrives)  Spiritual Encounters  Spiritual Needs Emotional;Grief support  Stress Factors  Patient Stress Factors None identified  Family Stress Factors None identified    Chaplain responded mostly for staff debrief. AC finally able to contact family. Family en route. Unit nurses were visibly distraught and seemingly in shock. Spent time offering emotional support and spiritual care.

## 2016-07-22 NOTE — Progress Notes (Signed)
[facility] for support for children and family of this patient who passed this morning.  Patient's 31 year old daughter, brother, and sister in law initially present.  Patient and her children live with maternal uncle, his wife, and their 31 year old child.  CSW offered emotional support and presence to daughter and family.  Extended family members arrived later and were also offered support.  Patient with 4 children, ages 2312, 1810, 789, and 653.  31 year old is in [place] with her grandmother.  All children have different fathers and none of the fathers are involved per patient's brother.  Plan is for children to return home with maternal uncle and his wife.  CSW provided resource information and contact numbers to patient's brother through help of an interpreter.  Patient's brother aware to contatc DSS regarding pursuing formal custody as well as ensuring  insurtance benefits for children remain in place. Also provided with contact number for [employer] for grief support for children as well as a funeral home list.  CSW worked along with interpreter, nurse, doctor, pediatric psychologist, and chaplain to provide family with needed support and information.   Gerrie NordmannMichelle Barrett-Hilton, LCSW [phone]

## 2016-07-22 DEATH — deceased
